# Patient Record
Sex: Female | Born: 1980 | Race: White | Hispanic: No | Marital: Married | Smoking: Current every day smoker
Health system: Southern US, Community
[De-identification: ages and names within clinical notes are randomized; demographics above are authoritative.]

## PROBLEM LIST (undated history)

## (undated) DIAGNOSIS — C50919 Malignant neoplasm of unspecified site of unspecified female breast: Secondary | ICD-10-CM

## (undated) DIAGNOSIS — I639 Cerebral infarction, unspecified: Secondary | ICD-10-CM

## (undated) DIAGNOSIS — I10 Essential (primary) hypertension: Secondary | ICD-10-CM

## (undated) DIAGNOSIS — M5430 Sciatica, unspecified side: Secondary | ICD-10-CM

## (undated) DIAGNOSIS — K5792 Diverticulitis of intestine, part unspecified, without perforation or abscess without bleeding: Secondary | ICD-10-CM

## (undated) DIAGNOSIS — C55 Malignant neoplasm of uterus, part unspecified: Secondary | ICD-10-CM

## (undated) DIAGNOSIS — G43909 Migraine, unspecified, not intractable, without status migrainosus: Secondary | ICD-10-CM

## (undated) DIAGNOSIS — A770 Spotted fever due to Rickettsia rickettsii: Secondary | ICD-10-CM

## (undated) HISTORY — PX: BREAST LUMPECTOMY: SHX2

## (undated) HISTORY — PX: ABDOMINAL HYSTERECTOMY: SHX81

## (undated) HISTORY — PX: CHOLECYSTECTOMY: SHX55

## (undated) HISTORY — PX: CARPAL TUNNEL RELEASE: SHX101

---

## 2011-06-06 DIAGNOSIS — I639 Cerebral infarction, unspecified: Secondary | ICD-10-CM

## 2011-06-06 HISTORY — DX: Cerebral infarction, unspecified: I63.9

## 2014-09-05 ENCOUNTER — Emergency Department (HOSPITAL_COMMUNITY)
Admission: EM | Admit: 2014-09-05 | Discharge: 2014-09-05 | Disposition: A | Discharge status: Home / Self Care | Payer: Medicaid Other | Attending: Emergency Medicine | Admitting: Emergency Medicine

## 2014-09-05 ENCOUNTER — Encounter (HOSPITAL_COMMUNITY): Payer: Self-pay | Admitting: *Deleted

## 2014-09-05 DIAGNOSIS — M545 Low back pain: Secondary | ICD-10-CM | POA: Diagnosis not present

## 2014-09-05 DIAGNOSIS — G8929 Other chronic pain: Secondary | ICD-10-CM | POA: Insufficient documentation

## 2014-09-05 DIAGNOSIS — Z853 Personal history of malignant neoplasm of breast: Secondary | ICD-10-CM | POA: Diagnosis not present

## 2014-09-05 DIAGNOSIS — I10 Essential (primary) hypertension: Secondary | ICD-10-CM | POA: Insufficient documentation

## 2014-09-05 DIAGNOSIS — Z760 Encounter for issue of repeat prescription: Secondary | ICD-10-CM | POA: Diagnosis present

## 2014-09-05 DIAGNOSIS — Z720 Tobacco use: Secondary | ICD-10-CM | POA: Diagnosis not present

## 2014-09-05 HISTORY — DX: Malignant neoplasm of unspecified site of unspecified female breast: C50.919

## 2014-09-05 HISTORY — DX: Sciatica, unspecified side: M54.30

## 2014-09-05 HISTORY — DX: Essential (primary) hypertension: I10

## 2014-09-05 MED ORDER — CYCLOBENZAPRINE HCL 10 MG PO TABS: 10.0000 mg | ORAL_TABLET | Freq: Three times a day (TID) | ORAL | Status: DC | PRN

## 2014-09-05 MED ORDER — CARVEDILOL 6.25 MG PO TABS: 6.2500 mg | ORAL_TABLET | Freq: Two times a day (BID) | ORAL | Status: DC

## 2014-09-05 MED ORDER — LISINOPRIL-HYDROCHLOROTHIAZIDE 20-12.5 MG PO TABS: 1.0000 | ORAL_TABLET | Freq: Every day | ORAL | Status: DC

## 2014-09-05 MED ORDER — LORATADINE 10 MG PO TABS: 10.0000 mg | ORAL_TABLET | Freq: Every day | ORAL | Status: DC

## 2014-09-05 MED ORDER — TRAMADOL HCL 50 MG PO TABS: 50.0000 mg | ORAL_TABLET | Freq: Four times a day (QID) | ORAL | Status: DC | PRN
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
## 2014-09-05 NOTE — Discharge Instructions (Signed)
Read the information below.  Use the prescribed medication as directed.  Please discuss all new medications with your pharmacist.  You may return to the Emergency Department at any time for worsening condition or any new symptoms that concern you.   Please check your blood pressure regularly.   Follow closely with your primary care provider.   If you develop fevers, loss of control of bowel or bladder, weakness or numbness in your legs, or are unable to walk, return to the ER for a recheck.

## 2014-09-19 ENCOUNTER — Emergency Department (HOSPITAL_COMMUNITY): Payer: Medicaid Other

## 2014-09-19 ENCOUNTER — Emergency Department (HOSPITAL_COMMUNITY)
Admission: EM | Admit: 2014-09-19 | Discharge: 2014-09-19 | Disposition: A | Discharge status: Home / Self Care | Payer: Medicaid Other | Attending: Emergency Medicine | Admitting: Emergency Medicine

## 2014-09-19 ENCOUNTER — Encounter (HOSPITAL_COMMUNITY): Payer: Self-pay

## 2014-09-19 DIAGNOSIS — I10 Essential (primary) hypertension: Secondary | ICD-10-CM | POA: Insufficient documentation

## 2014-09-19 DIAGNOSIS — G43909 Migraine, unspecified, not intractable, without status migrainosus: Secondary | ICD-10-CM | POA: Insufficient documentation

## 2014-09-19 DIAGNOSIS — Z79899 Other long term (current) drug therapy: Secondary | ICD-10-CM | POA: Insufficient documentation

## 2014-09-19 DIAGNOSIS — J0100 Acute maxillary sinusitis, unspecified: Secondary | ICD-10-CM | POA: Diagnosis not present

## 2014-09-19 DIAGNOSIS — Z853 Personal history of malignant neoplasm of breast: Secondary | ICD-10-CM | POA: Diagnosis not present

## 2014-09-19 DIAGNOSIS — R05 Cough: Secondary | ICD-10-CM | POA: Diagnosis present

## 2014-09-19 DIAGNOSIS — Z720 Tobacco use: Secondary | ICD-10-CM | POA: Diagnosis not present

## 2014-09-19 HISTORY — DX: Migraine, unspecified, not intractable, without status migrainosus: G43.909

## 2014-09-19 MED ORDER — BUTALBITAL-APAP-CAFFEINE 50-325-40 MG PO TABS: 2.0000 | ORAL_TABLET | ORAL | Status: DC | PRN

## 2014-09-19 MED ORDER — BENZONATATE 100 MG PO CAPS: 100.0000 mg | ORAL_CAPSULE | Freq: Three times a day (TID) | ORAL | Status: DC

## 2014-09-19 MED ORDER — AMOXICILLIN-POT CLAVULANATE 875-125 MG PO TABS: 1.0000 | ORAL_TABLET | Freq: Two times a day (BID) | ORAL | Status: DC

## 2014-09-19 NOTE — ED Provider Notes (Signed)
CSN: [id number]     Arrival date & time 09/19/14  1406 History   First MD Initiated Contact with Patient 09/19/14 [phone]     Chief Complaint  Patient presents with  . Cough  . Headache     (Consider location/radiation/quality/duration/timing/severity/associated sxs/prior Treatment) Patient is a 34 y.o. female presenting with cough. The history is provided by the patient. No language interpreter was used.  Cough Cough characteristics:  Productive Sputum characteristics:  Nondescript Severity:  Moderate Onset quality:  Gradual Duration:  2 weeks Timing:  Constant Progression:  Worsening Chronicity:  New Smoker: no   Context: upper respiratory infection   Relieved by:  Nothing Worsened by:  Nothing tried Associated symptoms: fever and sinus congestion   Associated symptoms: no chest pain    Pt thinks she may have a sinus infection.    Past Medical History  Diagnosis Date  . Sciatica   . Hypertension   . Breast cancer     left lumpectomy 2006  . Migraines    Past Surgical History  Procedure Laterality Date  . Abdominal hysterectomy    . Breast lumpectomy Left   . Carpal tunnel release Right    History reviewed. No pertinent family history. History  Substance Use Topics  . Smoking status: Current Every Day Smoker -- 0.25 packs/day    Types: Cigarettes  . Smokeless tobacco: Not on file  . Alcohol Use: Yes     Comment: rare   OB History    No data available     Review of Systems  Constitutional: Positive for fever.  Respiratory: Positive for cough.   Cardiovascular: Negative for chest pain.  All other systems reviewed and are negative.     Allergies  Reglan; Toradol; and Zofran  Home Medications   Prior to Admission medications   Medication Sig Start Date End Date Taking? Authorizing Provider  acetaminophen (TYLENOL) 325 MG tablet Take 650 mg by mouth every 6 (six) hours as needed for headache.   Yes Historical Provider, MD   Aspirin-Salicylamide-Caffeine (BC HEADACHE POWDER PO) Take 1 each by mouth as needed (headache).   Yes Historical Provider, MD  carvedilol (COREG) 6.25 MG tablet Take 1 tablet (6.25 mg total) by mouth 2 (two) times daily with a meal. 09/05/14  Yes Clayton Bibles, PA-C  lisinopril-hydrochlorothiazide (PRINZIDE,ZESTORETIC) 20-12.5 MG per tablet Take 1 tablet by mouth daily. 09/05/14  Yes Clayton Bibles, PA-C  loratadine (CLARITIN) 10 MG tablet Take 1 tablet (10 mg total) by mouth daily. 09/05/14  Yes Clayton Bibles, PA-C  cyclobenzaprine (FLEXERIL) 10 MG tablet Take 1 tablet (10 mg total) by mouth 3 (three) times daily as needed for muscle spasms. Patient not taking: Reported on 09/19/2014 09/05/14   Clayton Bibles, PA-C  traMADol (ULTRAM) 50 MG tablet Take 1 tablet (50 mg total) by mouth every 6 (six) hours as needed for moderate pain or severe pain. Patient not taking: Reported on 09/19/2014 09/05/14   Clayton Bibles, PA-C   BP 134/79 mmHg  Pulse 76  Temp(Src) 97.7 F (36.5 C) (Oral)  Resp 16  Ht 5\' 4"  (1.626 m)  Wt 144 lb 8 oz (65.545 kg)  BMI 24.79 kg/m2  SpO2 100% Physical Exam  Constitutional: She appears well-developed and well-nourished.  HENT:  Head: Normocephalic.  Right Ear: External ear normal.  Mouth/Throat: Oropharynx is clear and moist.  Eyes: Conjunctivae are normal. Pupils are equal, round, and reactive to light.  Neck: Normal range of motion. Neck supple.  Cardiovascular: Normal rate and normal  heart sounds.   Pulmonary/Chest: Effort normal and breath sounds normal.  Abdominal: Soft.  Musculoskeletal: Normal range of motion.  Skin: Skin is warm.  Nursing note and vitals reviewed.   ED Course  Procedures (including critical care time) Labs Review Labs Reviewed - No data to display  Imaging Review Dg Chest 2 View  09/19/2014   CLINICAL DATA:  Productive cough. Chest congestion. Dizziness for the past 2 weeks. Smoker. Hypertension.  EXAM: CHEST  2 VIEW  COMPARISON:  None.  FINDINGS: Normal  sized heart. Clear lungs. Mild diffuse peribronchial thickening and accentuation of the interstitial markings. Mild scoliosis. Cholecystectomy clips.  IMPRESSION: No acute abnormality.  Mild chronic bronchitic changes.   Electronically Signed   By: Claudie Revering M.D.   On: 09/19/2014 15:31     EKG Interpretation None      MDM   Final diagnoses:  Acute maxillary sinusitis, recurrence not specified    augmentin Tessalon fiorcet  (Pt has taken for headche in the past and request)    Fransico Meadow, PA-C 09/19/14 2239  Fredia Sorrow, MD 09/20/14 2136

## 2014-09-19 NOTE — ED Notes (Signed)
Onset 2 weeks productive cough- yellow phlegm, nasal congestion.  Onset several days headache and facial pain.  Onset this morning nausea, dizziness when standing.  No respiratory difficulties, talking in complete sentences.  Pt took IBU 800 mg @ 1 hour ago with no relief.

## 2014-09-19 NOTE — Discharge Instructions (Signed)
Sinusitis Sinusitis is redness, soreness, and inflammation of the paranasal sinuses. Paranasal sinuses are air pockets within the bones of your face (beneath the eyes, the middle of the forehead, or above the eyes). In healthy paranasal sinuses, mucus is able to drain out, and air is able to circulate through them by way of your nose. However, when your paranasal sinuses are inflamed, mucus and air can become trapped. This can allow bacteria and other germs to grow and cause infection. Sinusitis can develop quickly and last only a short time (acute) or continue over a long period (chronic). Sinusitis that lasts for more than 12 weeks is considered chronic.  CAUSES  Causes of sinusitis include:  Allergies.  Structural abnormalities, such as displacement of the cartilage that separates your nostrils (deviated septum), which can decrease the air flow through your nose and sinuses and affect sinus drainage.  Functional abnormalities, such as when the small hairs (cilia) that line your sinuses and help remove mucus do not work properly or are not present. SIGNS AND SYMPTOMS  Symptoms of acute and chronic sinusitis are the same. The primary symptoms are pain and pressure around the affected sinuses. Other symptoms include:  Upper toothache.  Earache.  Headache.  Bad breath.  Decreased sense of smell and taste.  A cough, which worsens when you are lying flat.  Fatigue.  Fever.  Thick drainage from your nose, which often is green and may contain pus (purulent).  Swelling and warmth over the affected sinuses. DIAGNOSIS  Your health care provider will perform a physical exam. During the exam, your health care provider may:  Look in your nose for signs of abnormal growths in your nostrils (nasal polyps).  Tap over the affected sinus to check for signs of infection.  View the inside of your sinuses (endoscopy) using an imaging device that has a light attached (endoscope). If your health  care provider suspects that you have chronic sinusitis, one or more of the following tests may be recommended:  Allergy tests.  Nasal culture. A sample of mucus is taken from your nose, sent to a lab, and screened for bacteria.  Nasal cytology. A sample of mucus is taken from your nose and examined by your health care provider to determine if your sinusitis is related to an allergy. TREATMENT  Most cases of acute sinusitis are related to a viral infection and will resolve on their own within 10 days. Sometimes medicines are prescribed to help relieve symptoms (pain medicine, decongestants, nasal steroid sprays, or saline sprays).  However, for sinusitis related to a bacterial infection, your health care provider will prescribe antibiotic medicines. These are medicines that will help kill the bacteria causing the infection.  Rarely, sinusitis is caused by a fungal infection. In theses cases, your health care provider will prescribe antifungal medicine. For some cases of chronic sinusitis, surgery is needed. Generally, these are cases in which sinusitis recurs more than 3 times per year, despite other treatments. HOME CARE INSTRUCTIONS   Drink plenty of water. Water helps thin the mucus so your sinuses can drain more easily.  Use a humidifier.  Inhale steam 3 to 4 times a day (for example, sit in the bathroom with the shower running).  Apply a warm, moist washcloth to your face 3 to 4 times a day, or as directed by your health care provider.  Use saline nasal sprays to help moisten and clean your sinuses.  Take medicines only as directed by your health care provider.    If you were prescribed either an antibiotic or antifungal medicine, finish it all even if you start to feel better. SEEK IMMEDIATE MEDICAL CARE IF:  You have increasing pain or severe headaches.  You have nausea, vomiting, or drowsiness.  You have swelling around your face.  You have vision problems.  You have a stiff  neck.  You have difficulty breathing. MAKE SURE YOU:   Understand these instructions.  Will watch your condition.  Will get help right away if you are not doing well or get worse. Document Released: 05/22/2005 Document Revised: 10/06/2013 Document Reviewed: 06/06/2011 [facility] Patient Information 2015 [place], [place]. This information is not intended to replace advice given to you by your health care provider. Make sure you discuss any questions you have with your health care provider.  Emergency Department Resource Guide 1) Find a Doctor and Pay Out of Pocket Although you won't have to find out who is covered by your insurance plan, it is a good idea to ask around and get recommendations. You will then need to call the office and see if the doctor you have chosen will accept you as a new patient and what types of options they offer for patients who are self-pay. Some doctors offer discounts or will set up payment plans for their patients who do not have insurance, but you will need to ask so you aren't surprised when you get to your appointment.  2) Contact Your Local Health Department Not all health departments have doctors that can see patients for sick visits, but many do, so it is worth a call to see if yours does. If you don't know where your local health department is, you can check in your phone book. The CDC also has a tool to help you locate your state's health department, and many state websites also have listings of all of their local health departments.  3) Find a [facility] If your illness is not likely to be very severe or complicated, you may want to try a walk in clinic. These are popping up all over the country in pharmacies, drugstores, and shopping centers. They're usually staffed by nurse practitioners or physician assistants that have been trained to treat common illnesses and complaints. They're usually fairly quick and inexpensive. However, if you have serious medical  issues or chronic medical problems, these are probably not your best option.  No Primary Care Doctor: - Call Health Connect at  [phone] - they can help you locate a primary care doctor that  accepts your insurance, provides certain services, etc. - Physician Referral Service- [phone]  Chronic Pain Problems: Organization         Address  Phone   Notes  [facility]  [phone] Patients need to be referred by their primary care doctor.   Medication Assistance: Organization         Address  Phone   Notes  [facility] Medication [facility] [address] [place],  [address] [phone] --Must be a resident of [facility] -- Must have NO insurance coverage whatsoever (no Medicaid/ Medicare, etc.) -- The pt. MUST have a primary care doctor that directs their care regularly and follows them in the community   MedAssist  [phone]   [employer]  [phone]    Agencies that provide inexpensive medical care: Organization         Address  Phone   Notes  [place]  [phone])  [facility]    [phone]   [facility] [place], [place] [address] [phone]   [place] [address] [place] [phone]   Planned Parenthood    [phone]   [facility]    [phone]   [place] and [place] [address], [place] Phone:  [phone], Fax:  [phone] Hours of Operation:  9 am - 6 pm, M-F.  Also accepts Medicaid/Medicare and self-pay.  [facility] for [place], [address], [place] Phone: [phone], Fax: [phone]. Hours of Operation:  8:30 am - 5:30 pm, M-F.  Also accepts Medicaid and self-pay.  [facility] [place] [address], [place] Phone: [phone]   [place], [place], [place]  [phone], Ext. [phone] Mondays & Thursdays: 7-9 AM.  First 15 patients are seen on a first come, first serve basis.    [place] Providers:  Organization         Address  Phone   Notes  [facility] [address], [place] [phone] Also accepts self-pay patients.  [facility] [address], [place]  [phone]   [place], [address], [place] [phone]   [facility] [address], [place] [phone]   Lucianne Lei [address], [place]   [phone] Only accepts [place] Access [place] patients after they have their name applied to their card.   Self-Pay (no insurance) in [facility]:  Organization         Address  Phone   Notes  Sickle Cell Patients, [facility] Internal Medicine [place] [phone]   [facility] Urgent Care [place] [phone]   [facility] [place]  JAARS, [place], [place] [phone]   [facility]/Dr. Osei-Bonsu  [address] [place] or [address], [place] [phone] Phone number for both [place] and [place] locations is the same.  Urgent Medical and [facility] [address], [place] [phone]   [facility] [address], [place] or [address] [phone]   [facility] [address] [place] [phone], phone; [phone], fax Sees patients 1st and 3rd Saturday of every month.  Must not qualify for public or private insurance (i.e. Medicaid, Medicare, [place] Health Choice, Veterans' Benefits)  Household income should be no more than 200% of the poverty level The clinic cannot treat you if you are pregnant or think you are pregnant  Sexually transmitted  diseases are not treated at the clinic.    Dental Care: Organization         Address  Phone  Notes  [facility] Department of [facility] [place] [phone] Accepts children up to age 17 who are enrolled in [place] or [place]; pregnant women with a Medicaid card; and children who have applied for Medicaid or  Health Choice, but were declined, whose parents can pay a reduced fee at time of service.  [place]  [address], [facility] [phone] Accepts children up to age 19 who are enrolled in Medicaid or [place]; pregnant women with a Medicaid card; and children who have applied for Medicaid or [place] Health Choice, but were declined, whose parents can pay a reduced fee at time of service.  [place] Adult Dental Access PROGRAM  [place] [phone] Patients are seen by appointment only. Walk-ins are not accepted. [place] will see patients 67 years of age and older. Monday - Tuesday (8am-5pm) Most Wednesdays (8:30-5pm) $30 per visit, cash only  [facility] Adult Dental Access PROGRAM  [address], [facility] [phone] Patients are seen by appointment only. Walk-ins are not accepted. [place] will see patients 33 years of age and older. One Wednesday Evening (Monthly: Volunteer Based).  $30 per visit, cash only  [place]  [phone] for adults; Children under age 69, call Graduate Pediatric Dentistry at [phone]. Children aged 36-14, please call [phone] to request a pediatric application.  Dental services are provided in all areas of dental care including fillings, crowns and bridges, complete and partial dentures, implants, gum treatment, root canals, and extractions. Preventive care is also provided. Treatment is provided to both adults and children. Patients are selected via a  lottery and there is often a waiting list.   [facility] [address], [place]  [phone] [email or web address]   [facility] [address] [place], [place] [phone], Ext. [phone] Second and Fourth Thursday of each month, opens at 6:30 AM; Clinic ends at 9 AM.  Patients are seen on a first-come first-served basis, and a limited number are seen during each clinic.   [facility]  [address] [place] Danker [place], [place] [phone]   Eligibility Requirements You must have lived in [place], [place], or [place] counties for at least the last three months.   You cannot be eligible for state or federal sponsored [employer], including [employer], [place], or [employer].   You generally cannot be eligible for healthcare insurance through your employer.    How to apply: Eligibility screenings are held every Tuesday and Wednesday afternoon from 1:00 pm until 4:00 pm. You do not need an appointment for the interview!  [facility] [address] [place], [place]   [place]  [place] Department  [place]  [phone]    Behavioral Health Resources in the Community: Intensive Outpatient Programs Organization         Address  Phone  Notes  [place] [facility]. [address] [place], [place] [phone]   [facility] Outpatient [address], [place], [place]   ADS: Alcohol & Drug Svcs [address], [place], [place]   [place] [address]  [place], [place] or [phone]   Substance Abuse Resources Organization         Address  Phone  Notes  Alcohol and Drug Services  [phone]   [place]  [phone]   [facility] [place]   Chinita Pester  [phone]   Residential &  Outpatient Substance Abuse Program  [phone]   Psychological Services Organization         Address  Phone  Notes  [place]  [place]  Services  [place] [address], [place] or [phone]    Mobile Crisis Teams Organization         Address  Phone  Notes  [facility], Mobile Crisis Care Unit  [phone]   [facility]  [address] [place], [place]   [place] [address], [place] [phone]    Self-Help/Support Groups Organization         Address  Phone             Notes  [place]. of [place] - variety of support groups  [place] Call for more information  Narcotics Anonymous (NA), [facility] [address], [employer] [place]  2 meetings at this location   Special educational needs teacher         Address  Phone  Notes  [facility] Residential Treatment [place],    [place]  [phone]   [facility]  [address], [place] [address], [place], [place]   [place], [place] [phone] Admissions: 8am-3pm M-F  Incentives Substance [place] [address]    [place], [place] [phone]   [facility] [address] [place], [place], [place]   [facility] [address],  [place], [place]   [facility]ight Programs - Intensive Outpatient [address], [place], [place]   [facility].,  [place], [place] [phone] or [phone]   [facility]idential Treatment Services (RTS) [address] [place], [place] Accepts Medicaid  [facility] [place] [address],  [place] [phone] Substance Abuse/Addiction Treatment   [facility] Organization          Address  Phone  Notes  [facility]  [phone]   Domenic Schwab, PhD [address] Arlis [place], [place]   [phone] or [phone]   [place], [place] [phone]   [facility] [address], [place], [place] [phone] Insurance/Medicaid/sponsorship through [facility] and Families [address] [place]                                    [place], [place] [phone] [place] [address], [place] [phone]    Dr. Adele Schilder  [phone]   [facility] Dept. 1) [address], [place] 2) [place] 3)  [place] [address], [place] [phone]  [phone]   [place] [phone] or [phone] (After Hours)

## 2014-09-19 NOTE — ED Notes (Signed)
Pt c/o congestion x 2 weeks; unrelieved with OTC medications. Reports generalized headache since yesterday, described as a fullness. Hx of migraines "but I haven't had one in really long time." Pt also reports productive cough with thick yellow phlegm

## 2014-09-20 ENCOUNTER — Emergency Department (HOSPITAL_COMMUNITY)
Admission: EM | Admit: 2014-09-20 | Discharge: 2014-09-20 | Disposition: A | Discharge status: Home / Self Care | Payer: Medicaid Other | Attending: Emergency Medicine | Admitting: Emergency Medicine

## 2014-09-20 ENCOUNTER — Encounter (HOSPITAL_COMMUNITY): Payer: Self-pay | Admitting: Emergency Medicine

## 2014-09-20 ENCOUNTER — Emergency Department (HOSPITAL_COMMUNITY): Payer: Medicaid Other

## 2014-09-20 DIAGNOSIS — M542 Cervicalgia: Secondary | ICD-10-CM | POA: Diagnosis not present

## 2014-09-20 DIAGNOSIS — R0981 Nasal congestion: Secondary | ICD-10-CM | POA: Insufficient documentation

## 2014-09-20 DIAGNOSIS — R112 Nausea with vomiting, unspecified: Secondary | ICD-10-CM | POA: Insufficient documentation

## 2014-09-20 DIAGNOSIS — Z8542 Personal history of malignant neoplasm of other parts of uterus: Secondary | ICD-10-CM | POA: Diagnosis not present

## 2014-09-20 DIAGNOSIS — R05 Cough: Secondary | ICD-10-CM | POA: Diagnosis not present

## 2014-09-20 DIAGNOSIS — G43909 Migraine, unspecified, not intractable, without status migrainosus: Secondary | ICD-10-CM | POA: Diagnosis not present

## 2014-09-20 DIAGNOSIS — R55 Syncope and collapse: Secondary | ICD-10-CM | POA: Insufficient documentation

## 2014-09-20 DIAGNOSIS — I10 Essential (primary) hypertension: Secondary | ICD-10-CM | POA: Diagnosis not present

## 2014-09-20 DIAGNOSIS — H53149 Visual discomfort, unspecified: Secondary | ICD-10-CM | POA: Diagnosis not present

## 2014-09-20 DIAGNOSIS — Z853 Personal history of malignant neoplasm of breast: Secondary | ICD-10-CM | POA: Diagnosis not present

## 2014-09-20 DIAGNOSIS — Z8673 Personal history of transient ischemic attack (TIA), and cerebral infarction without residual deficits: Secondary | ICD-10-CM | POA: Insufficient documentation

## 2014-09-20 DIAGNOSIS — Z79899 Other long term (current) drug therapy: Secondary | ICD-10-CM | POA: Insufficient documentation

## 2014-09-20 DIAGNOSIS — R51 Headache: Secondary | ICD-10-CM

## 2014-09-20 DIAGNOSIS — R059 Cough, unspecified: Secondary | ICD-10-CM

## 2014-09-20 DIAGNOSIS — R519 Headache, unspecified: Secondary | ICD-10-CM

## 2014-09-20 HISTORY — DX: Malignant neoplasm of uterus, part unspecified: C55

## 2014-09-20 HISTORY — DX: Malignant neoplasm of unspecified site of unspecified female breast: C50.919

## 2014-09-20 HISTORY — DX: Cerebral infarction, unspecified: I63.9

## 2014-09-20 LAB — CBC
HCT: 39.6 % (ref 36.0–46.0)
HEMOGLOBIN: 13.1 g/dL (ref 12.0–15.0)
MCH: 32.8 pg (ref 26.0–34.0)
MCHC: 33.1 g/dL (ref 30.0–36.0)
MCV: 99 fL (ref 78.0–100.0)
Platelets: 381 10*3/uL (ref 150–400)
RBC: 4 MIL/uL (ref 3.87–5.11)
RDW: 13 % (ref 11.5–15.5)
WBC: 7.1 10*3/uL (ref 4.0–10.5)

## 2014-09-20 LAB — CBG MONITORING, ED: GLUCOSE-CAPILLARY: 99 mg/dL (ref 70–99)

## 2014-09-20 LAB — BASIC METABOLIC PANEL
Anion gap: 9 (ref 5–15)
BUN: 15 mg/dL (ref 6–23)
CO2: 23 mmol/L (ref 19–32)
Calcium: 9.2 mg/dL (ref 8.4–10.5)
Chloride: 106 mmol/L (ref 96–112)
Creatinine, Ser: 0.81 mg/dL (ref 0.50–1.10)
GFR calc non Af Amer: >90 mL/min (ref >90)
GLUCOSE: 97 mg/dL (ref 70–99)
Potassium: 3.6 mmol/L (ref 3.5–5.1)
Sodium: 138 mmol/L (ref 135–145)

## 2014-09-20 LAB — I-STAT TROPONIN, ED: TROPONIN I, POC: 0 ng/mL (ref 0.00–0.08)

## 2014-09-20 LAB — D-DIMER, QUANTITATIVE: D-Dimer, Quant: <0.27 ug/mL-FEU (ref 0.00–0.48)

## 2014-09-20 MED ORDER — ACETAMINOPHEN 325 MG PO TABS
650.0000 mg | ORAL_TABLET | Freq: Once | ORAL | Status: DC
  Filled 2014-09-20: qty 2

## 2014-09-20 MED ORDER — ALBUTEROL SULFATE HFA 108 (90 BASE) MCG/ACT IN AERS
2.0000 | INHALATION_SPRAY | RESPIRATORY_TRACT | Status: DC | PRN
  Administered 2014-09-20: 2 via RESPIRATORY_TRACT
  Filled 2014-09-20: qty 6.7

## 2014-09-20 MED ORDER — PROMETHAZINE HCL 25 MG PO TABS: 25.0000 mg | ORAL_TABLET | Freq: Four times a day (QID) | ORAL | Status: DC | PRN

## 2014-09-20 MED ORDER — DIPHENHYDRAMINE HCL 50 MG/ML IJ SOLN
25.0000 mg | Freq: Once | INTRAMUSCULAR | Status: AC
  Administered 2014-09-20: 25 mg via INTRAVENOUS
  Filled 2014-09-20 (×2): qty 1

## 2014-09-20 MED ORDER — LORAZEPAM 1 MG PO TABS
0.5000 mg | ORAL_TABLET | Freq: Once | ORAL | Status: DC
  Filled 2014-09-20: qty 1

## 2014-09-20 MED ORDER — PROMETHAZINE HCL 25 MG/ML IJ SOLN
12.5000 mg | Freq: Once | INTRAMUSCULAR | Status: AC
  Administered 2014-09-20: 12.5 mg via INTRAVENOUS
  Filled 2014-09-20: qty 1

## 2014-09-20 MED ORDER — PROCHLORPERAZINE EDISYLATE 5 MG/ML IJ SOLN
10.0000 mg | Freq: Four times a day (QID) | INTRAMUSCULAR | Status: DC | PRN
  Administered 2014-09-20: 10 mg via INTRAVENOUS
  Filled 2014-09-20 (×2): qty 2

## 2014-09-20 NOTE — ED Provider Notes (Signed)
CSN: [id number]     Arrival date & time 09/20/14  6073 History   First MD Initiated Contact with Patient 09/20/14 [id number]     Chief Complaint  Patient presents with  . Neck Pain  . Chest Pain    recently diagnosed with bronchitis  . Loss of Consciousness     (Consider location/radiation/quality/duration/timing/severity/associated sxs/prior Treatment) HPI Comments: Patient with reported history of stroke with left sided weakness which resolved after a month of therapy, hypertension, DVT s/p blood thinner and not currently anticoagulated -- presents with c/o syncope. Patient has been having cough and sinus pressure for the past 2 weeks. Patient was seen in emergency department yesterday for this, had a negative chest x-ray, was prescribed Augmentin Tessalon which she started taking yesterday. She was also given Fioricet for a persistent headache. Patient has a history of migraines. Headache is similar to past migraines with photophobia. Patient states that she awoke at approximately 4 AM today with complaint of pressure across her entire chest with associated left arm heaviness. She was lying on the couch and stood up and syncopized. She blacked out and struck the back of her head on a nightstand. Patient continues to complain of persistent unchanged headache, neck pain, chest pressure. Patient states that she has been nauseous and vomiting for the past 2 days. No new leg swelling or tenderness. EMS was called and patient was placed in a c-collar. She was also given 324mg  ASA and nitroglycerin without any affect on her chest pressure. The onset of this condition was acute. The course is constant. Aggravating factors: none. Alleviating factors: none.    The history is provided by the patient.    Past Medical History  Diagnosis Date  . Sciatica   . Hypertension   . Breast cancer     left lumpectomy 2006  . Migraines   . Breast CA   . Uterine cancer   . Stroke 2013   Past Surgical History   Procedure Laterality Date  . Abdominal hysterectomy    . Breast lumpectomy Left   . Carpal tunnel release Right    No family history on file. History  Substance Use Topics  . Smoking status: Current Every Day Smoker -- 0.25 packs/day    Types: Cigarettes  . Smokeless tobacco: Not on file  . Alcohol Use: Yes     Comment: rare   OB History    No data available     Review of Systems  Constitutional: Negative for fever, diaphoresis and fatigue.  HENT: Positive for congestion and sinus pressure. Negative for dental problem, rhinorrhea and tinnitus.   Eyes: Positive for photophobia. Negative for pain, discharge, redness and visual disturbance.  Respiratory: Positive for cough. Negative for shortness of breath.   Cardiovascular: Positive for chest pain. Negative for palpitations and leg swelling.  Gastrointestinal: Positive for nausea and vomiting. Negative for abdominal pain.  Genitourinary: Negative for dysuria.  Musculoskeletal: Positive for neck pain and neck stiffness. Negative for back pain and gait problem.  Skin: Negative for rash and wound.  Neurological: Positive for syncope and headaches. Negative for dizziness, speech difficulty, weakness, light-headedness and numbness.  Psychiatric/Behavioral: Negative for confusion and decreased concentration. The patient is not nervous/anxious.     Allergies  Reglan; Toradol; and Zofran  Home Medications   Prior to Admission medications   Medication Sig Start Date End Date Taking? Authorizing Provider  acetaminophen (TYLENOL) 325 MG tablet Take 650 mg by mouth every 6 (six) hours as needed for headache.  Historical Provider, MD  amoxicillin-clavulanate (AUGMENTIN) 875-125 MG per tablet Take 1 tablet by mouth 2 (two) times daily. 09/19/14   Fransico Meadow, PA-C  Aspirin-Salicylamide-Caffeine [facility] HEADACHE POWDER PO) Take 1 each by mouth as needed (headache).    Historical Provider, MD  benzonatate (TESSALON) 100 MG capsule Take 1  capsule (100 mg total) by mouth every 8 (eight) hours. 09/19/14   Fransico Meadow, PA-C  butalbital-acetaminophen-caffeine [facility]) [phone] MG per tablet Take 2 tablets by mouth every 4 (four) hours as needed for headache. 09/19/14   Fransico Meadow, PA-C  carvedilol (COREG) 6.25 MG tablet Take 1 tablet (6.25 mg total) by mouth 2 (two) times daily with a meal. 09/05/14   Clayton Bibles, PA-C  cyclobenzaprine (FLEXERIL) 10 MG tablet Take 1 tablet (10 mg total) by mouth 3 (three) times daily as needed for muscle spasms. Patient not taking: Reported on 09/19/2014 09/05/14   Clayton Bibles, PA-C  lisinopril-hydrochlorothiazide (PRINZIDE,ZESTORETIC) 20-12.5 MG per tablet Take 1 tablet by mouth daily. 09/05/14   Clayton Bibles, PA-C  loratadine (CLARITIN) 10 MG tablet Take 1 tablet (10 mg total) by mouth daily. 09/05/14   Clayton Bibles, PA-C  traMADol (ULTRAM) 50 MG tablet Take 1 tablet (50 mg total) by mouth every 6 (six) hours as needed for moderate pain or severe pain. Patient not taking: Reported on 09/19/2014 09/05/14   Clayton Bibles, PA-C   BP 127/84 mmHg  Pulse 64  Resp 14  SpO2 100%   Physical Exam  Constitutional: She is oriented to person, place, and time. She appears well-developed and well-nourished.  Pt tearful.   HENT:  Head: Normocephalic and atraumatic. Head is without raccoon's eyes and without Battle's sign.  Right Ear: Tympanic membrane, external ear and ear canal normal. No hemotympanum.  Left Ear: Tympanic membrane, external ear and ear canal normal. No hemotympanum.  Nose: Nose normal. No nasal septal hematoma.  Mouth/Throat: Uvula is midline, oropharynx is clear and moist and mucous membranes are normal. Mucous membranes are not dry.  Eyes: Conjunctivae, EOM and lids are normal. Pupils are equal, round, and reactive to light. Right eye exhibits no nystagmus. Left eye exhibits no nystagmus.  No visible hyphema noted  Neck: Trachea normal. Neck supple. Normal carotid pulses and no JVD present. No muscular  tenderness present. Carotid bruit is not present. No tracheal deviation present.  Immobilized in c-collar  Cardiovascular: Normal rate, regular rhythm, S1 normal, S2 normal, normal heart sounds and intact distal pulses.  Exam reveals no decreased pulses.   No murmur heard. Pulmonary/Chest: Effort normal and breath sounds normal. No respiratory distress. She has no wheezes. She exhibits no tenderness.  Occasional cough during exam.   Abdominal: Soft. Normal aorta and bowel sounds are normal. There is no tenderness. There is no rebound and no guarding.  Musculoskeletal: Normal range of motion.       Cervical back: She exhibits normal range of motion, no tenderness and no bony tenderness.       Thoracic back: She exhibits no tenderness and no bony tenderness.       Lumbar back: She exhibits no tenderness and no bony tenderness.  Neurological: She is alert and oriented to person, place, and time. She has normal strength and normal reflexes. No cranial nerve deficit or sensory deficit. Coordination normal. GCS eye subscore is 4. GCS verbal subscore is 5. GCS motor subscore is 6.  Skin: Skin is warm and dry. She is not diaphoretic. No cyanosis. No pallor.  Psychiatric: She  has a normal mood and affect.  Nursing note and vitals reviewed.   ED Course  Procedures (including critical care time) Labs Review Labs Reviewed  CBC  BASIC METABOLIC PANEL  D-DIMER, QUANTITATIVE  I-STAT [place], ED  CBG MONITORING, ED    Imaging Review Dg Chest 2 View  09/19/2014   CLINICAL DATA:  Productive cough. Chest congestion. Dizziness for the past 2 weeks. Smoker. Hypertension.  EXAM: CHEST  2 VIEW  COMPARISON:  None.  FINDINGS: Normal sized heart. Clear lungs. Mild diffuse peribronchial thickening and accentuation of the interstitial markings. Mild scoliosis. Cholecystectomy clips.  IMPRESSION: No acute abnormality.  Mild chronic bronchitic changes.   Electronically Signed   By: Claudie Revering M.D.   On:  09/19/2014 15:31   Ct Head Wo Contrast  09/20/2014   CLINICAL DATA:  EMS called out for Chest pressure. Started around 0400. Seen at [facility] ED and diagnosed with Bronchitis yesterday and started on augmentin. States she was putting shoes on this morning and when she stood up she passed out. Woke up with head on night stand. Given asa 324 mg via ems and NTG SL X 1 with no relief in chest pain. C/O headache, neck pain, numbness to left side of face. Also c/o nausea. No vomiting at this time. H/o stroke, HTN.  EXAM: CT HEAD WITHOUT CONTRAST  CT CERVICAL SPINE WITHOUT CONTRAST  TECHNIQUE: Multidetector CT imaging of the head and cervical spine was performed following the standard protocol without intravenous contrast. Multiplanar CT image reconstructions of the cervical spine were also generated.  COMPARISON:  None.  FINDINGS: CT HEAD FINDINGS  Ventricles are normal in size and configuration. There are no parenchymal masses or mass effect. There is no evidence of an infarct. There are no extra-axial masses or abnormal fluid collections.  There is no intracranial hemorrhage.  No skull fracture. Mild mucosal thickening lines posterior ethmoid air cells in the right sphenoid sinus. Mastoid air cells are clear.  CT CERVICAL SPINE FINDINGS  No fracture. No spondylolisthesis. There are no degenerative changes. Soft tissues are unremarkable. Lung apices are clear.  IMPRESSION: HEAD CT: No intracranial abnormality. Mild sinus mucosal thickening.  CERVICAL CT:  Normal.   Electronically Signed   By: Lajean Manes M.D.   On: 09/20/2014 07:47   Ct Cervical Spine Wo Contrast  09/20/2014   CLINICAL DATA:  EMS called out for Chest pressure. Started around 0400. Seen at [facility] ED and diagnosed with Bronchitis yesterday and started on augmentin. States she was putting shoes on this morning and when she stood up she passed out. Woke up with head on night stand. Given asa 324 mg via ems and NTG SL X 1 with no relief in chest pain. C/O  headache, neck pain, numbness to left side of face. Also c/o nausea. No vomiting at this time. H/o stroke, HTN.  EXAM: CT HEAD WITHOUT CONTRAST  CT CERVICAL SPINE WITHOUT CONTRAST  TECHNIQUE: Multidetector CT imaging of the head and cervical spine was performed following the standard protocol without intravenous contrast. Multiplanar CT image reconstructions of the cervical spine were also generated.  COMPARISON:  None.  FINDINGS: CT HEAD FINDINGS  Ventricles are normal in size and configuration. There are no parenchymal masses or mass effect. There is no evidence of an infarct. There are no extra-axial masses or abnormal fluid collections.  There is no intracranial hemorrhage.  No skull fracture. Mild mucosal thickening lines posterior ethmoid air cells in the right sphenoid sinus. Mastoid air cells  are clear.  CT CERVICAL SPINE FINDINGS  No fracture. No spondylolisthesis. There are no degenerative changes. Soft tissues are unremarkable. Lung apices are clear.  IMPRESSION: HEAD CT: No intracranial abnormality. Mild sinus mucosal thickening.  CERVICAL CT:  Normal.   Electronically Signed   By: Lajean Manes M.D.   On: 09/20/2014 07:47     EKG Interpretation   Date/Time:  Sunday September 20 2014 06:53:24 EDT Ventricular Rate:  63 PR Interval:  127 QRS Duration: 80 QT Interval:  428 QTC Calculation: 438 R Axis:   70 Text Interpretation:  Sinus rhythm No old tracing to compare Confirmed by  [facility]  MD, ELLIOTT [phone]) on 09/20/2014 6:59:37 AM      7:11 AM Patient seen and examined. Work-up initiated. Medications ordered. Discussed with Dr. Mingo Amber. EKG reviewed.   Vital signs reviewed and are as follows: BP 127/84 mmHg  Pulse 64  Resp 14  SpO2 100%  8:18 AM Work-up is completely normal. Imaging normal (except for sinusitis). D-dimer negative. Patient is tearful and crying. She is allergic to toradol, reglan, zofran. She states she cannot tolerate compazine. She does not want ibuprofen, tylenol.    C-collar removed. She is able to move neck completely in all 6 directions. No signs of meningismus. Will give some PO ativan. Patient up in hallway ambulating.   8:37 AM Asked Dr. Mingo Amber to see patient. Patient has agreed to take compazine.   11:16 AM Patient received phenergan earlier as well for nausea. She was rechecked. She is sleepy but states HA is somewhat better and nausea is gone. She requests to go home and sleep. Neurologically, her exam is stable. No fever, meningismus, or other neuro deficits.   Will d/c to home.   Patient counseled to return if they have weakness in their arms or legs, slurred speech, trouble walking or talking, confusion, trouble with their balance, or if they have any other concerns. Patient verbalizes understanding and agrees with plan.    MDM   Final diagnoses:  HA (headache)  Syncope, unspecified syncope type  Acute nonintractable headache, unspecified headache type  Cough   Patient with cough, HA, syncopal episode today. She is not pregnant (previous hysterectomy). Imaging ordered 2/2 trauma - are neg. D-dimer due to previous blood clot history - negative. Other labs neg. Patient without high-risk features of headache including: sudden onset/thunderclap HA, no similar headache in past, altered mental status, accompanying seizure, headache with exertion, age > 3, history of immunocompromise, shoulder pain, neck stiffness, fever, use of anticoagulation, family history of spontaneous SAH, concomitant drug use, toxic exposure.   Patient has a normal complete neurological exam, normal vital signs, normal level of consciousness, no signs of meningismus, is well-appearing/non-toxic appearing, no signs of trauma.  No dangerous or life-threatening conditions suspected or identified by history, physical exam, and by work-up. No indications for hospitalization identified.     Carlisle Cater, PA-C 09/20/14 1119  Evelina Bucy, MD 09/20/14 [phone]

## 2014-09-20 NOTE — ED Notes (Signed)
Pt removed all of her monitoring equipment and ambulated with ease to the restroom.  Pt crying and yelling at staff stating she can't take anything by mouth because she's throwing up.  This RN has not seen any evidence of emesis since arrival; pt has been spitting on floor. Pt has refused both PO and IV medications. Pt began to rip her IV out and then stopped herself stated she wanted to talk to someone else.  Pt states we "are treating her like crap because she's poor."  Educated pt that we are unaware of her insurance status and that it has nothing to do with her care.  Notified [place], PA and [employer], Agricultural consultant.

## 2014-09-20 NOTE — ED Notes (Signed)
EMS called out for Chest pressure.  Started around 0400.  Diagnosed with Bronchitis yesterday and started on augmentin.  States I was putting my shoes on this morning and when I stood up I passed out.  Woke up with head on night stand.  Given asa 324 mg via ems and NTG SL X 1 with no relief in chest pain.  C/O headache, neck pain, numbness to left side of face.  Also c/o nausea.  No vomiting at this time.

## 2014-09-20 NOTE — ED Notes (Signed)
ekg given to Dr Wentz 

## 2014-09-20 NOTE — ED Notes (Addendum)
Pt ambulated to the bathroom, pt tolerated well. Pt refusing to be connected to the monitor, pt states "there's nothing wrong with me, why do I need to be hooked up to a heart monitor." RN at bedside, PA aware.

## 2014-09-20 NOTE — Discharge Instructions (Signed)
Please read and follow all provided instructions.  Your diagnoses today include:  1. Syncope, unspecified syncope type   2. HA (headache)   3. Acute nonintractable headache, unspecified headache type   4. Cough     Tests performed today include:  CT of your head and neck which was normal and did not show any serious cause of your headache  Blood counts and electrolytes - normal  Blood test for blood clot - negative  Vital signs. See below for your results today.   Medications:  In the Emergency Department you received:  Compazine - antinausea/headache medication  Benadryl - antihistamine to counteract potential side effects of reglan   Albuterol inhaler - medication that opens up your airway  Use inhaler as follows: 1-2 puffs with spacer every 4 hours as needed for wheezing, cough, or shortness of breath.    Phenergan (promethazine) - for nausea and vomiting   Take any prescribed medications only as directed.  Additional information:  Follow any educational materials contained in this packet.  You are having a headache. No specific cause was found today for your headache. It may have been a migraine or other cause of headache. Stress, anxiety, fatigue, and depression are common triggers for headaches.   Your headache today does not appear to be life-threatening or require hospitalization, but often the exact cause of headaches is not determined in the emergency department. Therefore, follow-up with your doctor is very important to find out what may have caused your headache and whether or not you need any further diagnostic testing or treatment.   Sometimes headaches can appear benign (not harmful), but then more serious symptoms can develop which should prompt an immediate re-evaluation by your doctor or the emergency department.  BE VERY CAREFUL not to take multiple medicines containing Tylenol (also called acetaminophen). Doing so can lead to an overdose which can  damage your liver and cause liver failure and possibly death.   Follow-up instructions: Please follow-up with your primary care provider in the next 3 days for further evaluation of your symptoms.   Return instructions:   Please return to the Emergency Department if you experience worsening symptoms.  Return if the medications do not resolve your headache, if it recurs, or if you have multiple episodes of vomiting or cannot keep down fluids.  Return if you have a change from the usual headache.  RETURN IMMEDIATELY IF you:  Develop a sudden, severe headache  Develop confusion or become poorly responsive or faint  Develop a fever above 100.61F or problem breathing  Have a change in speech, vision, swallowing, or understanding  Develop new weakness, numbness, tingling, incoordination in your arms or legs  Have a seizure  Please return if you have any other emergent concerns.  Additional Information:  Your vital signs today were: BP 109/67 mmHg   Pulse 66   Resp 18   SpO2 99% If your blood pressure (BP) was elevated above 135/85 this visit, please have this repeated by your doctor within one month. --------------

## 2014-09-20 NOTE — ED Notes (Signed)
Josh, PA-C, at the bedside.

## 2014-09-20 NOTE — ED Notes (Signed)
cbg of 99.

## 2014-09-20 NOTE — ED Notes (Signed)
Pt yelling and spitting on floor in exam room. Reports 10/10 headache at the time. Primary RN informed.

## 2014-09-25 ENCOUNTER — Emergency Department (HOSPITAL_COMMUNITY)
Admission: EM | Admit: 2014-09-25 | Discharge: 2014-09-25 | Disposition: A | Discharge status: Home / Self Care | Payer: Medicaid Other | Attending: Emergency Medicine | Admitting: Emergency Medicine

## 2014-09-25 ENCOUNTER — Encounter (HOSPITAL_COMMUNITY): Payer: Self-pay | Admitting: Emergency Medicine

## 2014-09-25 DIAGNOSIS — Z79899 Other long term (current) drug therapy: Secondary | ICD-10-CM | POA: Insufficient documentation

## 2014-09-25 DIAGNOSIS — I10 Essential (primary) hypertension: Secondary | ICD-10-CM | POA: Insufficient documentation

## 2014-09-25 DIAGNOSIS — Z792 Long term (current) use of antibiotics: Secondary | ICD-10-CM | POA: Diagnosis not present

## 2014-09-25 DIAGNOSIS — M545 Low back pain: Secondary | ICD-10-CM | POA: Diagnosis not present

## 2014-09-25 DIAGNOSIS — G8929 Other chronic pain: Secondary | ICD-10-CM

## 2014-09-25 DIAGNOSIS — Z8673 Personal history of transient ischemic attack (TIA), and cerebral infarction without residual deficits: Secondary | ICD-10-CM | POA: Diagnosis not present

## 2014-09-25 DIAGNOSIS — M549 Dorsalgia, unspecified: Secondary | ICD-10-CM

## 2014-09-25 DIAGNOSIS — Z853 Personal history of malignant neoplasm of breast: Secondary | ICD-10-CM | POA: Diagnosis not present

## 2014-09-25 DIAGNOSIS — Z8541 Personal history of malignant neoplasm of cervix uteri: Secondary | ICD-10-CM | POA: Diagnosis not present

## 2014-09-25 DIAGNOSIS — Z720 Tobacco use: Secondary | ICD-10-CM | POA: Insufficient documentation

## 2014-09-25 MED ORDER — TRAMADOL HCL 50 MG PO TABS: 50.0000 mg | ORAL_TABLET | Freq: Four times a day (QID) | ORAL | Status: DC | PRN

## 2014-09-25 MED ORDER — CYCLOBENZAPRINE HCL 10 MG PO TABS: 10.0000 mg | ORAL_TABLET | Freq: Two times a day (BID) | ORAL | Status: DC | PRN

## 2014-09-25 MED ORDER — PREDNISONE 10 MG (21) PO TBPK: 10.0000 mg | ORAL_TABLET | Freq: Every day | ORAL | Status: DC

## 2014-09-25 NOTE — Progress Notes (Signed)
CM spoke with pt who confirms self pay [facility] resident with no pcp. CM discussed and provided written information for self pay pcps, importance of pcp for f/u care, www.needymeds.org, www.goodrx.com, discounted pharmacies and other [employer] such as Felicia Moore, [facility], affordable care act,  [place] assistance, DSS and  health department  Reviewed resources for [employer] self pay pcps like [facility], family medicine at [address], [facility] family practice, general medical clinics, [facility] urgent care plus others, medication resources, [facility] out patient pharmacies and housing Pt voiced understanding and appreciation of resources provided   Provided [facility] contact information Pt agreed to referral Cm completed referral

## 2014-09-25 NOTE — ED Notes (Addendum)
Pt c/o back and bilateral leg pain. Patient knows she has history of sciatica. Has been re-locating residences and moving boxes so now has exacerbated pain. Says she is able to go a year and a half without pain and sometimes the pain will come three times in a month. Does not currently have insurance. Has appointment with [facility] on May 3rd but says the pain is too bad. Ambulatory with steady gait. Has intermittent numbness/tinlging in left leg radiating from left buttock and back. No other c/c. Took Aleve this morning around 1130. No alleviation of symptoms. Pt says MD in [place] gave her a steroid shot which "really helped" along with Flexeril and Ultram which alleviated pain.
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
## 2014-09-25 NOTE — Discharge Instructions (Signed)
Read the information below.  Use the prescribed medication as directed.  Please discuss all new medications with your pharmacist.  You may return to the Emergency Department at any time for worsening condition or any new symptoms that concern you.    If there is any possibility that you might be pregnant, please let your health care provider know and discuss this with the pharmacist to ensure medication safety.     If you develop fevers, loss of control of bowel or bladder, weakness or numbness in your legs, or are unable to walk, return to the ER for a recheck.    Chronic Back Pain  When back pain lasts longer than 3 months, it is called chronic back pain.People with chronic back pain often go through certain periods that are more intense (flare-ups).  CAUSES Chronic back pain can be caused by wear and tear (degeneration) on different structures in your back. These structures include:  The bones of your spine (vertebrae) and the joints surrounding your spinal cord and nerve roots (facets).  The strong, fibrous tissues that connect your vertebrae (ligaments). Degeneration of these structures may result in pressure on your nerves. This can lead to constant pain. HOME CARE INSTRUCTIONS  Avoid bending, heavy lifting, prolonged sitting, and activities which make the problem worse.  Take brief periods of rest throughout the day to reduce your pain. Lying down or standing usually is better than sitting while you are resting.  Take over-the-counter or prescription medicines only as directed by your caregiver. SEEK IMMEDIATE MEDICAL CARE IF:   You have weakness or numbness in one of your legs or feet.  You have trouble controlling your bladder or bowels.  You have nausea, vomiting, abdominal pain, shortness of breath, or fainting. Document Released: 06/29/2004 Document Revised: 08/14/2011 Document Reviewed: 05/06/2011 [facility] Patient Information 2015 [place], [place]. This information is not  intended to replace advice given to you by your health care provider. Make sure you discuss any questions you have with your health care provider.

## 2014-09-25 NOTE — ED Notes (Signed)
CM at bedside; will discharge post consult.

## 2014-09-25 NOTE — Progress Notes (Signed)
[facility] ED CM contacted by Triage RN/NP/PA to attempt to get pt an appointment at [facility]. States pt has been attempting to get an appt without success  [facility] ED Cm contacted a scheduler for [facility] and Cm unable to get pt an appointment.  Staff suggested pt go to [facility] during walk in clinic times Monday-Thursday 02-1029 am and from 2-3:30 pm for possible earlier appt This information placed in pt ED d/c f/u section  Pt also given further [place] self pay resources like [facility], [facility], [facility] and [facility] plus medication resources

## 2014-09-28 ENCOUNTER — Ambulatory Visit: Payer: Medicaid Other | Attending: Internal Medicine | Admitting: Family Medicine

## 2014-09-28 ENCOUNTER — Encounter: Payer: Self-pay | Admitting: Family Medicine

## 2014-09-28 VITALS — BP 116/72 | HR 79 | Temp 97.5°F | Resp 18 | Ht 64.0 in | Wt 149.6 lb

## 2014-09-28 DIAGNOSIS — Z720 Tobacco use: Secondary | ICD-10-CM | POA: Diagnosis not present

## 2014-09-28 DIAGNOSIS — I10 Essential (primary) hypertension: Secondary | ICD-10-CM | POA: Diagnosis not present

## 2014-09-28 DIAGNOSIS — M545 Low back pain, unspecified: Secondary | ICD-10-CM | POA: Insufficient documentation

## 2014-09-28 DIAGNOSIS — M5440 Lumbago with sciatica, unspecified side: Secondary | ICD-10-CM | POA: Diagnosis present

## 2014-09-28 DIAGNOSIS — M5442 Lumbago with sciatica, left side: Secondary | ICD-10-CM

## 2014-09-28 DIAGNOSIS — J309 Allergic rhinitis, unspecified: Secondary | ICD-10-CM | POA: Insufficient documentation

## 2014-09-28 MED ORDER — CYCLOBENZAPRINE HCL 10 MG PO TABS: 10.0000 mg | ORAL_TABLET | Freq: Two times a day (BID) | ORAL | Status: DC | PRN

## 2014-09-28 MED ORDER — TRAMADOL HCL 50 MG PO TABS: 50.0000 mg | ORAL_TABLET | Freq: Four times a day (QID) | ORAL | Status: DC | PRN
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
## 2014-09-28 NOTE — Patient Instructions (Signed)
Take medications as prescribed No living over 25 pounds for next 2 weeks. May use heat or ice, whichever helps most. Follow-up for visit to establish care with primary doctor in 3 weeks.

## 2014-10-09 ENCOUNTER — Encounter (HOSPITAL_COMMUNITY): Payer: Self-pay

## 2014-10-09 ENCOUNTER — Emergency Department (HOSPITAL_COMMUNITY)
Admission: EM | Admit: 2014-10-09 | Discharge: 2014-10-10 | Disposition: A | Discharge status: Home / Self Care | Payer: Medicaid Other | Attending: Emergency Medicine | Admitting: Emergency Medicine

## 2014-10-09 DIAGNOSIS — Z720 Tobacco use: Secondary | ICD-10-CM | POA: Diagnosis not present

## 2014-10-09 DIAGNOSIS — Z853 Personal history of malignant neoplasm of breast: Secondary | ICD-10-CM | POA: Diagnosis not present

## 2014-10-09 DIAGNOSIS — R140 Abdominal distension (gaseous): Secondary | ICD-10-CM

## 2014-10-09 DIAGNOSIS — Z8542 Personal history of malignant neoplasm of other parts of uterus: Secondary | ICD-10-CM | POA: Diagnosis not present

## 2014-10-09 DIAGNOSIS — I10 Essential (primary) hypertension: Secondary | ICD-10-CM | POA: Diagnosis not present

## 2014-10-09 DIAGNOSIS — G43909 Migraine, unspecified, not intractable, without status migrainosus: Secondary | ICD-10-CM | POA: Insufficient documentation

## 2014-10-09 DIAGNOSIS — Z8673 Personal history of transient ischemic attack (TIA), and cerebral infarction without residual deficits: Secondary | ICD-10-CM | POA: Insufficient documentation

## 2014-10-09 DIAGNOSIS — R1030 Lower abdominal pain, unspecified: Secondary | ICD-10-CM

## 2014-10-09 DIAGNOSIS — Z7952 Long term (current) use of systemic steroids: Secondary | ICD-10-CM | POA: Insufficient documentation

## 2014-10-09 DIAGNOSIS — K5732 Diverticulitis of large intestine without perforation or abscess without bleeding: Secondary | ICD-10-CM | POA: Diagnosis not present

## 2014-10-09 DIAGNOSIS — Z79899 Other long term (current) drug therapy: Secondary | ICD-10-CM | POA: Insufficient documentation

## 2014-10-09 DIAGNOSIS — Z90710 Acquired absence of both cervix and uterus: Secondary | ICD-10-CM | POA: Diagnosis not present

## 2014-10-09 DIAGNOSIS — R109 Unspecified abdominal pain: Secondary | ICD-10-CM | POA: Diagnosis present

## 2014-10-09 DIAGNOSIS — Z792 Long term (current) use of antibiotics: Secondary | ICD-10-CM | POA: Insufficient documentation

## 2014-10-09 LAB — CBC WITH DIFFERENTIAL/PLATELET
Basophils Absolute: 0 10*3/uL (ref 0.0–0.1)
Basophils Relative: 0 % (ref 0–1)
Eosinophils Absolute: 0.2 10*3/uL (ref 0.0–0.7)
Eosinophils Relative: 3 % (ref 0–5)
HCT: 38.3 % (ref 36.0–46.0)
Hemoglobin: 12.6 g/dL (ref 12.0–15.0)
Lymphocytes Relative: 44 % (ref 12–46)
Lymphs Abs: 3.1 10*3/uL (ref 0.7–4.0)
MCH: 33.4 pg (ref 26.0–34.0)
MCHC: 32.9 g/dL (ref 30.0–36.0)
MCV: 101.6 fL — ABNORMAL HIGH (ref 78.0–100.0)
Monocytes Absolute: 0.4 10*3/uL (ref 0.1–1.0)
Monocytes Relative: 6 % (ref 3–12)
Neutro Abs: 3.2 10*3/uL (ref 1.7–7.7)
Neutrophils Relative %: 47 % (ref 43–77)
Platelets: 311 10*3/uL (ref 150–400)
RBC: 3.77 MIL/uL — ABNORMAL LOW (ref 3.87–5.11)
RDW: 12.9 % (ref 11.5–15.5)
WBC: 6.9 10*3/uL (ref 4.0–10.5)

## 2014-10-09 LAB — COMPREHENSIVE METABOLIC PANEL
ALT: 51 U/L (ref 14–54)
AST: 40 U/L (ref 15–41)
Albumin: 4.1 g/dL (ref 3.5–5.0)
Alkaline Phosphatase: 86 U/L (ref 38–126)
Anion gap: 7 (ref 5–15)
BUN: 21 mg/dL — ABNORMAL HIGH (ref 6–20)
CO2: 22 mmol/L (ref 22–32)
Calcium: 9 mg/dL (ref 8.9–10.3)
Chloride: 110 mmol/L (ref 101–111)
Creatinine, Ser: 0.67 mg/dL (ref 0.44–1.00)
GFR calc Af Amer: >60 mL/min (ref >60)
GFR calc non Af Amer: >60 mL/min (ref >60)
Glucose, Bld: 119 mg/dL — ABNORMAL HIGH (ref 70–99)
Potassium: 3.8 mmol/L (ref 3.5–5.1)
Sodium: 139 mmol/L (ref 135–145)
Total Bilirubin: 0.5 mg/dL (ref 0.3–1.2)
Total Protein: 7.3 g/dL (ref 6.5–8.1)

## 2014-10-09 LAB — LIPASE, BLOOD: Lipase: 23 U/L (ref 22–51)

## 2014-10-09 NOTE — ED Notes (Signed)
Pt presents via EMS from home with c/o abdominal pain that started today. Pt reports distention to EMS and vomiting bile today. Pt also reported that she has been having some pus in her bowel movements today.

## 2014-10-09 NOTE — ED Notes (Signed)
Pt asked to go back to lobby after she told the NT who drew her blood she was "not sitting back in the lobby with the other people". This RN explained that the consult room is where we draw blood for waiting patient's. Pt reports that she is having "dark blood" in her stool when she uses the restroom and abdomen appears distended. Pt made aware of the wait and will be moving patients as soon as clean rooms are available.

## 2014-10-10 ENCOUNTER — Emergency Department (HOSPITAL_COMMUNITY): Payer: Medicaid Other

## 2014-10-10 LAB — URINALYSIS, ROUTINE W REFLEX MICROSCOPIC
Bilirubin Urine: NEGATIVE
Glucose, UA: NEGATIVE mg/dL
KETONES UR: NEGATIVE mg/dL
LEUKOCYTES UA: NEGATIVE
NITRITE: NEGATIVE
PH: 5.5 (ref 5.0–8.0)
PROTEIN: NEGATIVE mg/dL
SPECIFIC GRAVITY, URINE: 1.042 — AB (ref 1.005–1.030)
UROBILINOGEN UA: 0.2 mg/dL (ref 0.0–1.0)

## 2014-10-10 LAB — URINE MICROSCOPIC-ADD ON

## 2014-10-10 MED ORDER — HYDROCODONE-ACETAMINOPHEN 5-325 MG PO TABS
1.0000 | ORAL_TABLET | Freq: Once | ORAL | Status: AC
  Administered 2014-10-10: 1 via ORAL
  Filled 2014-10-10: qty 1

## 2014-10-10 MED ORDER — IOHEXOL 300 MG/ML  SOLN
50.0000 mL | Freq: Once | INTRAMUSCULAR | Status: AC | PRN
  Administered 2014-10-10: 50 mL via ORAL

## 2014-10-10 MED ORDER — PROMETHAZINE HCL 25 MG/ML IJ SOLN
12.5000 mg | Freq: Once | INTRAMUSCULAR | Status: AC
  Administered 2014-10-10: 12.5 mg via INTRAVENOUS
  Filled 2014-10-10: qty 1

## 2014-10-10 MED ORDER — PROMETHAZINE HCL 25 MG PO TABS: 25.0000 mg | ORAL_TABLET | Freq: Four times a day (QID) | ORAL | Status: DC | PRN

## 2014-10-10 MED ORDER — IOHEXOL 300 MG/ML  SOLN
100.0000 mL | Freq: Once | INTRAMUSCULAR | Status: AC | PRN
  Administered 2014-10-10: 100 mL via INTRAVENOUS

## 2014-10-10 MED ORDER — CIPROFLOXACIN HCL 500 MG PO TABS
500.0000 mg | ORAL_TABLET | Freq: Once | ORAL | Status: AC
  Administered 2014-10-10: 500 mg via ORAL
  Filled 2014-10-10: qty 1

## 2014-10-10 MED ORDER — SODIUM CHLORIDE 0.9 % IV BOLUS (SEPSIS)
1000.0000 mL | Freq: Once | INTRAVENOUS | Status: AC
  Administered 2014-10-10: 1000 mL via INTRAVENOUS

## 2014-10-10 MED ORDER — CIPROFLOXACIN HCL 500 MG PO TABS: 500.0000 mg | ORAL_TABLET | Freq: Two times a day (BID) | ORAL | Status: DC

## 2014-10-10 MED ORDER — HYDROMORPHONE HCL 1 MG/ML IJ SOLN
0.5000 mg | Freq: Once | INTRAMUSCULAR | Status: AC
  Administered 2014-10-10: 0.5 mg via INTRAVENOUS
  Filled 2014-10-10: qty 1

## 2014-10-10 MED ORDER — METRONIDAZOLE 500 MG PO TABS
500.0000 mg | ORAL_TABLET | Freq: Once | ORAL | Status: AC
  Administered 2014-10-10: 500 mg via ORAL
  Filled 2014-10-10: qty 1

## 2014-10-10 MED ORDER — METRONIDAZOLE 500 MG PO TABS: 500.0000 mg | ORAL_TABLET | Freq: Two times a day (BID) | ORAL | Status: DC

## 2014-10-10 MED ORDER — HYDROCODONE-ACETAMINOPHEN 5-325 MG PO TABS: 1.0000 | ORAL_TABLET | Freq: Four times a day (QID) | ORAL | Status: DC | PRN

## 2014-10-10 NOTE — ED Provider Notes (Signed)
Review of CT scan shows that patient has right upper quadrant diverticulitis.  She has been started on Cipro, Flagyl, Phenergan for nausea and hydrocodone for pain control.  This was discussed with patient.  He was comfortable going home.  She states she does not have a primary care physician.  She has been given a Fut[employer] trader as she just recently received her Medicaid card  Junius Creamer, NP 10/10/14 2641  Merryl Hacker, MD 10/10/14 [phone]

## 2014-10-10 NOTE — ED Provider Notes (Signed)
CSN: [id number]     Arrival date & time 10/09/14  2111 History   First MD Initiated Contact with Patient 10/09/14 2333     Chief Complaint  Patient presents with  . Abdominal Pain     (Consider location/radiation/quality/duration/timing/severity/associated sxs/prior Treatment) Patient is a 34 y.o. female presenting with abdominal pain. The history is provided by the patient.  Abdominal Pain Associated symptoms: no chest pain, no chills, no diarrhea, no dysuria, no fever, no shortness of breath and no vomiting   Felicia Moore is a 34 y.o female with a history of breast cancer and HTN who presents with new onset and worsening right sided abdominal cramping that began yesterday.  She states her abdomen began to swell and she has had small bowel movement with mucus.  She also has nausea but no vomiting.  She denies any fever, shortness of breath, vomiting, hematochezia, constipation, dysuria, hematuria, or urinary frequency. She did have a cholecystectomy and partial hysterectomy.     Past Medical History  Diagnosis Date  . Sciatica   . Hypertension   . Breast cancer     left lumpectomy 2006  . Migraines   . Breast CA   . Uterine cancer   . Stroke 2013   Past Surgical History  Procedure Laterality Date  . Abdominal hysterectomy    . Breast lumpectomy Left   . Carpal tunnel release Right    No family history on file. History  Substance Use Topics  . Smoking status: Current Every Day Smoker -- 0.25 packs/day    Types: Cigarettes  . Smokeless tobacco: Not on file  . Alcohol Use: Yes     Comment: rare   OB History    No data available     Review of Systems  Constitutional: Negative for fever and chills.  Respiratory: Negative for shortness of breath.   Cardiovascular: Negative for chest pain.  Gastrointestinal: Positive for abdominal pain and abdominal distention. Negative for vomiting, diarrhea and blood in stool.  Genitourinary: Negative for dysuria and frequency.  All  other systems reviewed and are negative.     Allergies  Reglan; Toradol; and Zofran  Home Medications   Prior to Admission medications   Medication Sig Start Date End Date Taking? Authorizing Provider  acetaminophen (TYLENOL) 325 MG tablet Take 650 mg by mouth every 6 (six) hours as needed for headache.    Historical Provider, MD  amoxicillin-clavulanate (AUGMENTIN) 875-125 MG per tablet Take 1 tablet by mouth 2 (two) times daily. Patient not taking: Reported on 09/28/2014 09/19/14   Fransico Meadow, PA-C  Aspirin-Salicylamide-Caffeine [facility] HEADACHE POWDER PO) Take 1 each by mouth as needed (headache).    Historical Provider, MD  benzonatate (TESSALON) 100 MG capsule Take 1 capsule (100 mg total) by mouth every 8 (eight) hours. Patient not taking: Reported on 09/28/2014 09/19/14   Fransico Meadow, PA-C  butalbital-acetaminophen-caffeine [facility]) [phone] MG per tablet Take 2 tablets by mouth every 4 (four) hours as needed for headache. 09/19/14   Fransico Meadow, PA-C  carvedilol (COREG) 6.25 MG tablet Take 1 tablet (6.25 mg total) by mouth 2 (two) times daily with a meal. 09/05/14   Clayton Bibles, PA-C  cyclobenzaprine (FLEXERIL) 10 MG tablet Take 1 tablet (10 mg total) by mouth 2 (two) times daily as needed for muscle spasms. 09/28/14   Micheline Chapman, NP  lisinopril-hydrochlorothiazide (PRINZIDE,ZESTORETIC) 20-12.5 MG per tablet Take 1 tablet by mouth daily. 09/05/14   Clayton Bibles, PA-C  loratadine (CLARITIN) 10  MG tablet Take 1 tablet (10 mg total) by mouth daily. 09/05/14   Clayton Bibles, PA-C  predniSONE (STERAPRED UNI-PAK 21 TAB) 10 MG (21) TBPK tablet Take 1 tablet (10 mg total) by mouth daily. Day 1: take 6 tabs.  Day 2: 5 tabs  Day 3: 4 tabs  Day 4: 3 tabs  Day 5: 2 tabs  Day 6: 1 tab 09/25/14   Clayton Bibles, PA-C  promethazine (PHENERGAN) 25 MG tablet Take 1 tablet (25 mg total) by mouth every 6 (six) hours as needed for nausea or vomiting. Patient not taking: Reported on 09/28/2014 09/20/14   Carlisle Cater, PA-C  traMADol (ULTRAM) 50 MG tablet Take 1 tablet (50 mg total) by mouth every 6 (six) hours as needed for severe pain. 09/28/14   Micheline Chapman, NP   BP 141/91 mmHg  Pulse 82  Temp(Src) 99 F (37.2 C) (Oral)  Resp 19  SpO2 98% Physical Exam  Constitutional: She is oriented to person, place, and time. She appears well-developed and well-nourished.  HENT:  Head: Normocephalic and atraumatic.  Eyes: Conjunctivae are normal.  Neck: Normal range of motion. Neck supple.  Cardiovascular: Normal rate, regular rhythm and normal heart sounds.   Pulmonary/Chest: Effort normal.  Abdominal: She exhibits distension. There is tenderness in the right lower quadrant and left lower quadrant.  Musculoskeletal: Normal range of motion.  Neurological: She is alert and oriented to person, place, and time.  Skin: Skin is warm and dry.  Nursing note and vitals reviewed.   ED Course  Procedures (including critical care time) Labs Review Labs Reviewed  CBC WITH DIFFERENTIAL/PLATELET - Abnormal; Notable for the following:    RBC 3.77 (*)    MCV 101.6 (*)    All other components within normal limits  COMPREHENSIVE METABOLIC PANEL - Abnormal; Notable for the following:    Glucose, Bld 119 (*)    BUN 21 (*)    All other components within normal limits  LIPASE, BLOOD  URINALYSIS, ROUTINE W REFLEX MICROSCOPIC    Imaging Review Dg Abd 1 View  10/10/2014   CLINICAL DATA:  Abdominal pain, distention, and swelling. Nausea, vomiting, and constipation for 1 day.  EXAM: ABDOMEN - 1 VIEW  COMPARISON:  None.  FINDINGS: Right upper quadrant abdominal surgical clips are noted. Gas is present in the nondilated small and large bowel without evidence of obstruction. There is a small to moderate amount of stool throughout the colon. No acute osseous abnormality is seen.  IMPRESSION: Nonobstructed bowel-gas pattern.   Electronically Signed   By: Logan Bores   On: 10/10/2014 00:18     EKG  Interpretation None      MDM   Final diagnoses:  Abdominal distension  Lower abdominal pain  Patient presents for abdominal cramping and distention that began yesterday.  She has had several small mucusy bowel movements. On exam she has moderate abdominal distention with RLQ and LLQ abdominal pain with palpation.  Her vitals are stable and her labs are not concerning. Her CXR shows nonobstructing bowel gas pattern.  She has a history of cancer so I will get a CT abdomen.   I discussed this patient with and transferred care to Eula Flax, PA who will follow the patient.  The plan depends on the results of the CT.        Ottie Glazier, PA-C 10/10/14 0103  Milton Ferguson, MD 10/10/14 [phone]

## 2014-10-10 NOTE — Discharge Instructions (Signed)
°Emergency Department Resource Guide °1) Find a Doctor and Pay Out of Pocket °Although you won't have to find out who is covered by your insurance plan, it is a good idea to ask around and get recommendations. You will then need to call the office and see if the doctor you have chosen will accept you as a new patient and what types of options they offer for patients who are self-pay. Some doctors offer discounts or will set up payment plans for their patients who do not have insurance, but you will need to ask so you aren't surprised when you get to your appointment. ° °2) Contact Your Local Health Department °Not all health departments have doctors that can see patients for sick visits, but many do, so it is worth a call to see if yours does. If you don't know where your local health department is, you can check in your phone book. The CDC also has a tool to help you locate your state's health department, and many state websites also have listings of all of their local health departments. ° °3) Find a Walk-in Clinic °If your illness is not likely to be very severe or complicated, you may want to try a walk in clinic. These are popping up all over the country in pharmacies, drugstores, and shopping centers. They're usually staffed by nurse practitioners or physician assistants that have been trained to treat common illnesses and complaints. They're usually fairly quick and inexpensive. However, if you have serious medical issues or chronic medical problems, these are probably not your best option. ° °No Primary Care Doctor: °- Call [facility] at  832-8000 - they can help you locate a primary care doctor that  accepts your insurance, provides certain services, etc. °- Physician Referral Service- 1-800-533-3463 ° °Chronic Pain Problems: °Organization         Address  Phone   Notes  °[facility]  (336) 297-2271 Patients need to be referred by their primary care doctor.  ° °Medication  Assistance: °Organization         Address  Phone   Notes  °[place] [facility] [address] °[place], [place] [address] (336) 641-8030 --Must be a resident of [place] °-- Must have NO insurance coverage whatsoever (no Medicaid/ Medicare, etc.) °-- The pt. MUST have a primary care doctor that directs their care regularly and follows them in the community °  °[facility]  (866) 331-1348   °[employer]  (888) 892-1162   ° °Agencies that provide inexpensive medical care: °Organization         Address  Phone   Notes  °[facility]  (336) 832-8035   °[facility]    (336) 832-7272   °[facility] [address] °[place], [place] [address] (336) 832-4777   °[facility] [address], °[place] (336) 271-4999   °[facility]    (336) 373-0678   °[facility]    (336) 272-1050   °[facility] ° [address], [place] Phone:  (336) 832-4444, Fax:  (336) 832-4440 Hours of Operation:  9 am - 6 pm, M-F.  Also accepts Medicaid/Medicare and self-pay.  °[facility] ° [address], [place] Phone: (336) 832-3150, Fax: (336) [phone]. Hours of Operation:  8:30 am - 5:30 pm, M-F.  Also accepts Medicaid and self-pay.  °[facility] [place] [address], [place] Phone: (336) 878-6027   °[facility] [address], [place], [place] (336)723-1848, Ext. 123 Mondays & Thursdays: 7-9 AM.  First 15 patients are seen on a first come, first serve basis. °  ° °Medicaid-accepting [place] Providers: ° °Organization         Address  Phone   Notes  °[facility] [address], [place] (336) 641-2100 Also accepts self-pay patients.  °[facility] [address], [place] ° (336) 856-9996   °[facility] [address], [place]  (336) 288-8857   °[facility] [address] [place] [address], [place] (336) 299-7000   °Veita Bland [address], [place]  ° (336) 373-1557 Only accepts [place] Access Medicaid patients after they have their name applied to their card.  ° °Self-Pay (no insurance) in [place]: ° °Organization         Address  Phone   Notes  °Sickle Cell Patients, [facility] [address], [place] (336) 832-1970   °[facility] [address], [place] (336) 832-4400   °[facility] ° [address], [place] (336) 992-4800   °[facility]/Dr. Osei-Bonsu ° [address] [place] [address], [place] or [address], [place] (336) [phone] Phone number for both [place] and [place] locations is the same.  °[facility] [address], [place] (336) 299-0000   °[facility] [address] [place] [address], [place] or [address] (336) 852-7530 °(336) 878-2260   °[facility] [address], [place] (336) 350-1642, phone; (336) 294-5005, fax Sees patients 1st and 3rd Saturday of every month.  Must not qualify for public or private insurance (i.e. Medicaid, Medicare, [place] Health Choice, Veterans' Benefits) • Household income should be no more than 200% of the poverty level •The clinic cannot treat you if you are pregnant or think you are pregnant • Sexually transmitted diseases are not treated at the clinic.  ° ° °Dental Care: °Organization         Address  Phone  Notes  °[place] [facility] [address], [place] (336) 641-6152 Accepts children up to age 21 who are enrolled in Medicaid or [place] Health Choice; pregnant women with a Medicaid card; and children who have applied for Medicaid or [place] Health Choice, but were declined, whose parents can pay a reduced fee at time of service.  °[place]  [facility] [place]  [address], [place] (336) 641-7733 Accepts children up to age 21 who are enrolled in Medicaid or [place] Health Choice; pregnant women with a Medicaid card; and children who have applied for Medicaid or  Health Choice, but were declined, whose parents can pay a reduced fee at time of service.  °[facility] ° [address], [place] (336) 641-4533 Patients are seen by appointment only. Walk-ins are not accepted. [facility] will see patients 18 years of age and older. °Monday - Tuesday (8am-5pm) °Most Wednesdays (8:30-5pm) °$30 per visit, cash only  °[facility] ° [address], [place] (336) 641-4533 Patients are seen by appointment only. Walk-ins are not accepted. [facility] will see patients 18 years of age and older. °One   Wednesday Evening (Monthly: Volunteer Based).  $30 per visit, cash only  °[facility]  (919) 537-3737 for adults; Children under age 4, call Graduate Pediatric Dentistry at ([phone]) [phone]. Children aged 4-14, please call (919) [phone] to request a pediatric application. ° Dental services are provided in all areas of dental care including fillings, crowns and bridges, complete and partial dentures, implants, gum treatment, root canals, and extractions. Preventive care is also provided. Treatment is provided to both adults and children. °Patients are selected via a lottery and there is often a waiting list. °  °[facility] [address], °[place] ° (336) 763-8833 [email or web address] °  °[facility] [address], [place], [place] (336)723-1848, Ext. 123 Second and Fourth Thursday of each month, opens at 6:30 AM; Clinic ends at 9 AM.  Patients are seen on a first-come first-served basis, and a limited number are seen during each clinic.  ° °[facility] ° [address], [place], [place] (336) [phone]    Eligibility Requirements °You must have lived in [place], [place], or [place] counties for at least the last three months. °  You cannot be eligible for state or federal sponsored healthcare insurance, including Veterans Administration, Medicaid, or Medicare. °  You generally cannot be eligible for healthcare insurance through your employer.  °  How to apply: °Eligibility screenings are held every Tuesday and Wednesday afternoon from 1:00 pm until 4:00 pm. You do not need an appointment for the interview!  °[facility] [address], [place], [place] 336-631-2330   °[facility]  336-342-8273   °[facility]  336-703-3100   °[facility]  336-570-6415   ° °Behavioral Health Resources in the Community: °Intensive Outpatient Programs °Organization         Address  Phone  Notes  °[facility] [address], [place], [place] 336-878-6098   °[facility] [address], [place], [place] 336-832-9800   °[facility] [address], [place], [place] ° 336-882-2125   °[facility] [address],  °[place], [place] 1-800-853-5163 or 336-641-4981   °Substance Abuse Resources °Organization         Address  Phone  Notes  °[facility]  336-882-2125   °[facility]  336-784-9470   °[facility]  336-285-9073   °[facility]  336-845-3988   °Residential & Outpatient Substance Abuse Program  1-800-659-3381   °Psychological Services °Organization         Address  Phone  Notes  °[facility]  336- 832-9600   °[facility]  336- 378-7881   °[facility] [address], [place] 1-800-853-5163 or 336-641-4981   ° °Mobile Crisis Teams °Organization         Address  Phone  Notes  °[facility], Mobile Crisis Care Unit  1-877-626-1772   °Assertive °[facility] ° [address]  [place], [place] 336-834-9664   °Sharon DeEsch [address] °[place] 336-554-5454   ° °Self-Help/Support Groups °Organization         Address  Phone             Notes  °[facility] - variety of support groups  336- 373-1402 Call for more information  °Narcotics Anonymous (NA), [facility] [address], °[place]  2 meetings at this location  ° °  Residential Treatment Programs °Organization         Address  Phone  Notes  °[facility] [address],    °[place]  1-866-801-8205   °[facility] ° [address], [place], [place] 704-293-8524   °[facility] [address], [place] 336-845-3988 Admissions: 8am-3pm M-F  °[facility] [address]    °[place], [place] 336-841-1104   °[facility] [address], [place], [place] 336-379-7146   °[facility] [address]  °[place], [place] 336-285-9073   °[facility] Intensive Outpatient [address], [place], [place] 336-852-3033   °[facility] [address]  °[place], [place] 1-877-615-2722 or 336-784-9470   °[facility] [address] [place], [place] 336-227-7417 Accepts Medicaid  °[facility] [address]  °[place] 1-800-659-3381 Substance Abuse/Addiction Treatment  ° °[place] Behavioral Health Resources °Organization         Address  Phone  Notes  °[facility]  (888) 581-9988   °Julie Brannon, PhD [address] [place], [place]   (336) 349-5553 or (336) 951-0000   °[facility]   [address] °[place], [place] (336) 349-4454   °[facility] [address], [place], [place] (336) 342-8316 Insurance/Medicaid/sponsorship through [facility]  °[facility] [address]                                    [place], [place] (336) 342-8316 Therapy/tele-psych/case    °[facility] [address]  ° [place], [place] (336) 349-2233    °Dr. Arfeen  (336) 349-4544   °[facility] [place]  [employer] [place] [facility] 1) [address],  °2) [address], [place] °3)  [address], [place] (336) 349-3220 °(336) 342-7768 ° °(336) 342-8140   °[place] [facility] (336) 342-1394 or (336) 342-3537 (After Hours)    ° ° °

## 2014-10-15 ENCOUNTER — Emergency Department (HOSPITAL_COMMUNITY)
Admission: EM | Admit: 2014-10-15 | Discharge: 2014-10-15 | Disposition: A | Discharge status: Home / Self Care | Payer: Medicaid Other | Attending: Emergency Medicine | Admitting: Emergency Medicine

## 2014-10-15 ENCOUNTER — Encounter (HOSPITAL_COMMUNITY): Payer: Self-pay | Admitting: Emergency Medicine

## 2014-10-15 DIAGNOSIS — Z79899 Other long term (current) drug therapy: Secondary | ICD-10-CM | POA: Insufficient documentation

## 2014-10-15 DIAGNOSIS — K5792 Diverticulitis of intestine, part unspecified, without perforation or abscess without bleeding: Secondary | ICD-10-CM

## 2014-10-15 DIAGNOSIS — Z8542 Personal history of malignant neoplasm of other parts of uterus: Secondary | ICD-10-CM | POA: Insufficient documentation

## 2014-10-15 DIAGNOSIS — K5732 Diverticulitis of large intestine without perforation or abscess without bleeding: Secondary | ICD-10-CM | POA: Insufficient documentation

## 2014-10-15 DIAGNOSIS — Z8673 Personal history of transient ischemic attack (TIA), and cerebral infarction without residual deficits: Secondary | ICD-10-CM | POA: Diagnosis not present

## 2014-10-15 DIAGNOSIS — R1110 Vomiting, unspecified: Secondary | ICD-10-CM | POA: Diagnosis present

## 2014-10-15 DIAGNOSIS — Z792 Long term (current) use of antibiotics: Secondary | ICD-10-CM | POA: Diagnosis not present

## 2014-10-15 DIAGNOSIS — R05 Cough: Secondary | ICD-10-CM | POA: Diagnosis not present

## 2014-10-15 DIAGNOSIS — Z791 Long term (current) use of non-steroidal anti-inflammatories (NSAID): Secondary | ICD-10-CM | POA: Insufficient documentation

## 2014-10-15 DIAGNOSIS — I10 Essential (primary) hypertension: Secondary | ICD-10-CM | POA: Diagnosis not present

## 2014-10-15 DIAGNOSIS — G43909 Migraine, unspecified, not intractable, without status migrainosus: Secondary | ICD-10-CM | POA: Insufficient documentation

## 2014-10-15 DIAGNOSIS — Z90710 Acquired absence of both cervix and uterus: Secondary | ICD-10-CM | POA: Insufficient documentation

## 2014-10-15 DIAGNOSIS — Z720 Tobacco use: Secondary | ICD-10-CM | POA: Diagnosis not present

## 2014-10-15 LAB — COMPREHENSIVE METABOLIC PANEL
ALK PHOS: 64 U/L (ref 38–126)
ALT: 20 U/L (ref 14–54)
AST: 17 U/L (ref 15–41)
Albumin: 3.9 g/dL (ref 3.5–5.0)
Anion gap: 8 (ref 5–15)
BILIRUBIN TOTAL: 0.4 mg/dL (ref 0.3–1.2)
BUN: 24 mg/dL — ABNORMAL HIGH (ref 6–20)
CHLORIDE: 110 mmol/L (ref 101–111)
CO2: 23 mmol/L (ref 22–32)
CREATININE: 0.71 mg/dL (ref 0.44–1.00)
Calcium: 8.8 mg/dL — ABNORMAL LOW (ref 8.9–10.3)
GFR calc Af Amer: >60 mL/min (ref >60)
Glucose, Bld: 93 mg/dL (ref 65–99)
POTASSIUM: 3.8 mmol/L (ref 3.5–5.1)
SODIUM: 141 mmol/L (ref 135–145)
Total Protein: 6.4 g/dL — ABNORMAL LOW (ref 6.5–8.1)

## 2014-10-15 LAB — URINALYSIS, ROUTINE W REFLEX MICROSCOPIC
Bilirubin Urine: NEGATIVE
Glucose, UA: NEGATIVE mg/dL
KETONES UR: NEGATIVE mg/dL
NITRITE: NEGATIVE
PROTEIN: NEGATIVE mg/dL
SPECIFIC GRAVITY, URINE: 1.004 — AB (ref 1.005–1.030)
UROBILINOGEN UA: 0.2 mg/dL (ref 0.0–1.0)
pH: 6 (ref 5.0–8.0)

## 2014-10-15 LAB — CBC WITH DIFFERENTIAL/PLATELET
BASOS ABS: 0 10*3/uL (ref 0.0–0.1)
Basophils Relative: 1 % (ref 0–1)
EOS ABS: 0.1 10*3/uL (ref 0.0–0.7)
EOS PCT: 2 % (ref 0–5)
HEMATOCRIT: 34.9 % — AB (ref 36.0–46.0)
HEMOGLOBIN: 11.6 g/dL — AB (ref 12.0–15.0)
LYMPHS ABS: 2.3 10*3/uL (ref 0.7–4.0)
Lymphocytes Relative: 38 % (ref 12–46)
MCH: 33.4 pg (ref 26.0–34.0)
MCHC: 33.2 g/dL (ref 30.0–36.0)
MCV: 100.6 fL — ABNORMAL HIGH (ref 78.0–100.0)
MONOS PCT: 6 % (ref 3–12)
Monocytes Absolute: 0.4 10*3/uL (ref 0.1–1.0)
Neutro Abs: 3.3 10*3/uL (ref 1.7–7.7)
Neutrophils Relative %: 53 % (ref 43–77)
Platelets: 422 10*3/uL — ABNORMAL HIGH (ref 150–400)
RBC: 3.47 MIL/uL — ABNORMAL LOW (ref 3.87–5.11)
RDW: 12.7 % (ref 11.5–15.5)
WBC: 6.1 10*3/uL (ref 4.0–10.5)

## 2014-10-15 LAB — URINE MICROSCOPIC-ADD ON

## 2014-10-15 LAB — LIPASE, BLOOD: Lipase: 24 U/L (ref 22–51)

## 2014-10-15 MED ORDER — PROMETHAZINE HCL 25 MG PO TABS
25.0000 mg | ORAL_TABLET | Freq: Once | ORAL | Status: AC
  Administered 2014-10-15: 25 mg via ORAL
  Filled 2014-10-15: qty 1

## 2014-10-15 MED ORDER — PROMETHAZINE HCL 25 MG PO TABS
25.0000 mg | ORAL_TABLET | Freq: Four times a day (QID) | ORAL | Status: DC | PRN
Start: 2014-10-15 — End: 2014-11-16

## 2014-10-15 MED ORDER — OXYCODONE-ACETAMINOPHEN 5-325 MG PO TABS
2.0000 | ORAL_TABLET | Freq: Once | ORAL | Status: AC
  Administered 2014-10-15: 2 via ORAL
  Filled 2014-10-15: qty 2

## 2014-10-15 MED ORDER — OXYCODONE-ACETAMINOPHEN 5-325 MG PO TABS: 1.0000 | ORAL_TABLET | ORAL | Status: DC | PRN

## 2014-10-15 MED ORDER — AMOXICILLIN-POT CLAVULANATE 875-125 MG PO TABS
1.0000 | ORAL_TABLET | Freq: Once | ORAL | Status: AC
  Administered 2014-10-15: 1 via ORAL
  Filled 2014-10-15: qty 1

## 2014-10-15 MED ORDER — AMOXICILLIN-POT CLAVULANATE 875-125 MG PO TABS: 1.0000 | ORAL_TABLET | Freq: Two times a day (BID) | ORAL | Status: DC

## 2014-10-15 NOTE — Progress Notes (Addendum)
Pt with medicaid since CM saw her last Reports she and female at bedside are "suppose to have appointments at [facility] on the six[facility] but I would like to get a medicaid doctor closer to my home." Pt said " I have a question. We have been told we can get a bus pass." CM explained to pt that ED SW may be able to provide a bust pass but it would only for the pt.   Cm provided pt and female with lists of [facility] providers to assist with getting medicaid dr close to her home  Pt given a bus pass and her ED RN notified pt given bus pass already  Cm encouraged pt to get a pcp and f/u with [facility] appt

## 2014-10-15 NOTE — ED Provider Notes (Signed)
CSN: [id number]     Arrival date & time 10/15/14  1320 History   First MD Initiated Contact with Patient 10/15/14 1503     Chief Complaint  Patient presents with  . Diverticulitis  . Emesis   Patient is a 34 y.o. female presenting with vomiting. The history is provided by the patient.  Emesis Severity:  Moderate Timing:  Intermittent Progression:  Worsening Chronicity:  New Relieved by:  Nothing Exacerbated by: medications. Associated symptoms: abdominal pain   Associated symptoms: no diarrhea and no fever   Pt seen for abdominal pain on 10/10/14 and found to have diverticulitis She was started on outpatient medications including cipro/flagyl She reports she has intense nausea/vomiting due to medications She reports her abd pain is worseing (it is in right upper quadrant) No fever No diarrhea No rectal bleeding She reports mild cough No other complaints   Past Medical History  Diagnosis Date  . Sciatica   . Hypertension   . Breast cancer     left lumpectomy 2006  . Migraines   . Breast CA   . Uterine cancer   . Stroke 2013   Past Surgical History  Procedure Laterality Date  . Abdominal hysterectomy    . Breast lumpectomy Left   . Carpal tunnel release Right    No family history on file. History  Substance Use Topics  . Smoking status: Current Every Day Smoker -- 0.25 packs/day    Types: Cigarettes  . Smokeless tobacco: Not on file  . Alcohol Use: Yes     Comment: rare   OB History    No data available     Review of Systems  Constitutional: Negative for fever.  Respiratory: Positive for cough.   Gastrointestinal: Positive for vomiting and abdominal pain. Negative for diarrhea and blood in stool.  All other systems reviewed and are negative.     Allergies  Reglan; Toradol; and Zofran  Home Medications   Prior to Admission medications   Medication Sig Start Date End Date Taking? Authorizing Provider  acetaminophen (TYLENOL) 325 MG tablet Take 650  mg by mouth every 6 (six) hours as needed for headache.   Yes Historical Provider, MD  albuterol (PROVENTIL HFA;VENTOLIN HFA) 108 (90 BASE) MCG/ACT inhaler Inhale 1-2 puffs into the lungs every 6 (six) hours as needed for wheezing or shortness of breath.   Yes Historical Provider, MD  Aspirin-Salicylamide-Caffeine (BC HEADACHE POWDER PO) Take 1 each by mouth as needed (headache).   Yes Historical Provider, MD  butalbital-acetaminophen-caffeine [facility]) 50-325-40 MG per tablet Take 2 tablets by mouth every 4 (four) hours as needed for headache. 09/19/14  Yes Fransico Meadow, PA-C  carvedilol (COREG) 6.25 MG tablet Take 1 tablet (6.25 mg total) by mouth 2 (two) times daily with a meal. 09/05/14  Yes Clayton Bibles, PA-C  ciprofloxacin (CIPRO) 500 MG tablet Take 1 tablet (500 mg total) by mouth 2 (two) times daily. 10/10/14  Yes Junius Creamer, NP  HYDROcodone-acetaminophen (NORCO/VICODIN) 5-325 MG per tablet Take 1 tablet by mouth every 6 (six) hours as needed for moderate pain. 10/10/14  Yes Junius Creamer, NP  lisinopril-hydrochlorothiazide (PRINZIDE,ZESTORETIC) 20-12.5 MG per tablet Take 1 tablet by mouth daily. 09/05/14  Yes Clayton Bibles, PA-C  loratadine (CLARITIN) 10 MG tablet Take 1 tablet (10 mg total) by mouth daily. 09/05/14  Yes Clayton Bibles, PA-C  metroNIDAZOLE (FLAGYL) 500 MG tablet Take 1 tablet (500 mg total) by mouth 2 (two) times daily. 10/10/14  Yes Junius Creamer, NP  naproxen sodium (ANAPROX) 220 MG tablet Take 220 mg by mouth 2 (two) times daily with a meal.   Yes Historical Provider, MD  promethazine (PHENERGAN) 25 MG tablet Take 1 tablet (25 mg total) by mouth every 6 (six) hours as needed for nausea or vomiting. 10/10/14  Yes Junius Creamer, NP   BP 149/92 mmHg  Pulse 73  Temp(Src) 97.6 F (36.4 C) (Oral)  Resp 16  SpO2 100% Physical Exam CONSTITUTIONAL: Well developed/well nourished HEAD: Normocephalic/atraumatic EYES: EOMI/PERRL ENMT: Mucous membranes moist NECK: supple no meningeal  signs SPINE/BACK:entire spine nontender CV: S1/S2 noted, no murmurs/rubs/gallops noted LUNGS: Lungs are clear to auscultation bilaterally, no apparent distress ABDOMEN: soft, mild tenderness to RUQ, no rebound or guarding, bowel sounds noted throughout abdomen GU:no cva tenderness NEURO: Pt is awake/alert/appropriate, moves all extremitiesx4.  No facial droop.   EXTREMITIES: pulses normal/equal, full ROM SKIN: warm, color normal PSYCH: no abnormalities of mood noted, alert and oriented to situation  ED Course  Procedures  Medications  promethazine (PHENERGAN) tablet 25 mg (25 mg Oral Given 10/15/14 1618)  oxyCODONE-acetaminophen (PERCOCET/ROXICET) 5-325 MG per tablet 2 tablet (2 tablets Oral Given 10/15/14 1544)  amoxicillin-clavulanate (AUGMENTIN) 875-125 MG per tablet 1 tablet (1 tablet Oral Given 10/15/14 1544)    3:57 PM Pt well appearing and in no distress She can not tolerate cipro/flagyl Will stop those meds and start augmentin Other than mild RUQ tenderness no other focal tenderness, no peritoneal signs 4:46 PM Pt taking PO  Well appearing Will start augmentin Referred to GI for diverticulitis  Labs Review Labs Reviewed  CBC WITH DIFFERENTIAL/PLATELET - Abnormal; Notable for the following:    RBC 3.47 (*)    Hemoglobin 11.6 (*)    HCT 34.9 (*)    MCV 100.6 (*)    Platelets 422 (*)    All other components within normal limits  COMPREHENSIVE METABOLIC PANEL - Abnormal; Notable for the following:    BUN 24 (*)    Calcium 8.8 (*)    Total Protein 6.4 (*)    All other components within normal limits  URINALYSIS, ROUTINE W REFLEX MICROSCOPIC - Abnormal; Notable for the following:    APPearance CLOUDY (*)    Specific Gravity, Urine 1.004 (*)    Hgb urine dipstick LARGE (*)    Leukocytes, UA TRACE (*)    All other components within normal limits  URINE MICROSCOPIC-ADD ON - Abnormal; Notable for the following:    Squamous Epithelial / LPF MANY (*)    All other  components within normal limits  LIPASE, BLOOD      MDM   Final diagnoses:  Diverticulitis of intestine without perforation or abscess without bleeding    Nursing notes including past medical history and social history reviewed and considered in documentation Labs/vital reviewed myself and considered during evaluation Previous records reviewed and considered     Ripley Fraise, MD 10/15/14 1646

## 2014-10-15 NOTE — ED Notes (Signed)
MD at bedside. 

## 2014-10-15 NOTE — ED Notes (Signed)
Pt c/o Severe R side abdominal pain x 1 week. Pt was seen here "a few days ago" and was diagnosed with diverticulitis. Pt returns to ED with worsening pain and sts that she cannot take the antibiotics prescribed because she is nauseous and vomiting. Pt was also given pain medication which she has run out of. Pt denies having any difficulty taking the pain pills, just the antibiotics. Pt also c/o abdominal distention. Denies diarrhea, fevers. A&Ox4 and ambulatory.

## 2014-10-15 NOTE — Discharge Instructions (Signed)
Diverticulitis °Diverticulitis is when small pockets that have formed in your colon (large intestine) become infected or swollen. °HOME CARE °· Follow your doctor's instructions. °· Follow a special diet if told by your doctor. °· When you feel better, your doctor may tell you to change your diet. You may be told to eat a lot of fiber. Fruits and vegetables are good sources of fiber. Fiber makes it easier to poop (have bowel movements). °· Take supplements or probiotics as told by your doctor. °· Only take medicines as told by your doctor. °· Keep all follow-up visits with your doctor. °GET HELP IF: °· Your pain does not get better. °· You have a hard time eating food. °· You are not pooping like normal. °GET HELP RIGHT AWAY IF: °· Your pain gets worse. °· Your problems do not get better. °· Your problems suddenly get worse. °· You have a fever. °· You keep throwing up (vomiting). °· You have bloody or black, tarry poop (stool). °MAKE SURE YOU:  °· Understand these instructions. °· Will watch your condition. °· Will get help right away if you are not doing well or get worse. °Document Released: 11/08/2007 Document Revised: 05/27/2013 Document Reviewed: 04/16/2013 °ExitCare® Patient Information ©2015 [employer]. This information is not intended to replace advice given to you by your health care provider. Make sure you discuss any questions you have with your health care provider. ° °

## 2014-10-18 ENCOUNTER — Emergency Department (INDEPENDENT_AMBULATORY_CARE_PROVIDER_SITE_OTHER)
Admission: EM | Admit: 2014-10-18 | Discharge: 2014-10-18 | Disposition: A | Discharge status: Home / Self Care | Payer: Medicaid Other | Source: Home / Self Care | Attending: Family Medicine | Admitting: Family Medicine

## 2014-10-18 ENCOUNTER — Encounter (HOSPITAL_COMMUNITY): Payer: Self-pay | Admitting: *Deleted

## 2014-10-18 DIAGNOSIS — I10 Essential (primary) hypertension: Secondary | ICD-10-CM | POA: Diagnosis not present

## 2014-10-18 MED ORDER — CARVEDILOL 6.25 MG PO TABS: 6.2500 mg | ORAL_TABLET | Freq: Two times a day (BID) | ORAL | Status: DC

## 2014-10-18 MED ORDER — LISINOPRIL-HYDROCHLOROTHIAZIDE 20-12.5 MG PO TABS: 1.0000 | ORAL_TABLET | Freq: Every day | ORAL | Status: DC

## 2014-10-18 NOTE — Discharge Instructions (Signed)
See your doctor for further refills of medicines.

## 2014-10-18 NOTE — ED Provider Notes (Signed)
CSN: [id number]     Arrival date & time 10/18/14  1659 History   First MD Initiated Contact with Patient 10/18/14 1744     Chief Complaint  Patient presents with  . Medication Refill   (Consider location/radiation/quality/duration/timing/severity/associated sxs/prior Treatment) Patient is a 34 y.o. female presenting with hypertension. The history is provided by the patient.  Hypertension This is a chronic problem. The current episode started more than 1 week ago. The problem has not changed (pt here for refill on bp meds. no local md.) since onset.Pertinent negatives include no chest pain and no abdominal pain.    Past Medical History  Diagnosis Date  . Sciatica   . Hypertension   . Breast cancer     left lumpectomy 2006  . Migraines   . Breast CA   . Uterine cancer   . Stroke 2013   Past Surgical History  Procedure Laterality Date  . Abdominal hysterectomy    . Breast lumpectomy Left   . Carpal tunnel release Right    History reviewed. No pertinent family history. History  Substance Use Topics  . Smoking status: Current Every Day Smoker -- 0.25 packs/day    Types: Cigarettes  . Smokeless tobacco: Not on file  . Alcohol Use: Yes     Comment: rare   OB History    No data available     Review of Systems  Constitutional: Negative.   Respiratory: Negative.   Cardiovascular: Negative.  Negative for chest pain.  Gastrointestinal: Negative for abdominal pain.    Allergies  Reglan; Toradol; and Zofran  Home Medications   Prior to Admission medications   Medication Sig Start Date End Date Taking? Authorizing Provider  acetaminophen (TYLENOL) 325 MG tablet Take 650 mg by mouth every 6 (six) hours as needed for headache.    Historical Provider, MD  albuterol (PROVENTIL HFA;VENTOLIN HFA) 108 (90 BASE) MCG/ACT inhaler Inhale 1-2 puffs into the lungs every 6 (six) hours as needed for wheezing or shortness of breath.    Historical Provider, MD  amoxicillin-clavulanate  (AUGMENTIN) 875-125 MG per tablet Take 1 tablet by mouth 2 (two) times daily. One po bid x 7 days 10/15/14   Ripley Fraise, MD  carvedilol (COREG) 6.25 MG tablet Take 1 tablet (6.25 mg total) by mouth 2 (two) times daily with a meal. 10/18/14   Billy Fischer, MD  lisinopril-hydrochlorothiazide (PRINZIDE,ZESTORETIC) 20-12.5 MG per tablet Take 1 tablet by mouth daily. 10/18/14   Billy Fischer, MD  loratadine (CLARITIN) 10 MG tablet Take 1 tablet (10 mg total) by mouth daily. 09/05/14   Clayton Bibles, PA-C  naproxen sodium (ANAPROX) 220 MG tablet Take 220 mg by mouth 2 (two) times daily with a meal.    Historical Provider, MD  oxyCODONE-acetaminophen (PERCOCET/ROXICET) 5-325 MG per tablet Take 1 tablet by mouth every 4 (four) hours as needed for severe pain. 10/15/14   Ripley Fraise, MD  promethazine (PHENERGAN) 25 MG tablet Take 1 tablet (25 mg total) by mouth every 6 (six) hours as needed for nausea or vomiting. 10/15/14   Ripley Fraise, MD   BP 172/94 mmHg  Pulse 67  Temp(Src) 98 F (36.7 C) (Oral)  Resp 16  SpO2 100% Physical Exam  Constitutional: She is oriented to person, place, and time. She appears well-developed and well-nourished.  Neck: Normal range of motion. Neck supple.  Cardiovascular: Regular rhythm, normal heart sounds and intact distal pulses.   Pulmonary/Chest: Effort normal and breath sounds normal.  Musculoskeletal: She exhibits  no edema.  Lymphadenopathy:    She has no cervical adenopathy.  Neurological: She is alert and oriented to person, place, and time.  Skin: Skin is warm and dry.  Nursing note and vitals reviewed.   ED Course  Procedures (including critical care time) Labs Review Labs Reviewed - No data to display  Imaging Review No results found.   MDM   1. Essential hypertension        Billy Fischer, MD 10/18/14 [phone]

## 2014-10-18 NOTE — ED Notes (Signed)
Pt  Is  Asking  For  Med  Refills   She  States  Has  [place] pcp [place]      She  Reports   She  Has  Relocated from   [place]

## 2014-10-19 ENCOUNTER — Ambulatory Visit: Payer: Self-pay | Admitting: Family Medicine

## 2014-10-28 ENCOUNTER — Encounter (HOSPITAL_COMMUNITY): Payer: Self-pay | Admitting: Emergency Medicine

## 2014-10-28 ENCOUNTER — Emergency Department (INDEPENDENT_AMBULATORY_CARE_PROVIDER_SITE_OTHER)
Admission: EM | Admit: 2014-10-28 | Discharge: 2014-10-28 | Disposition: A | Discharge status: Home / Self Care | Payer: Medicaid Other | Source: Home / Self Care | Attending: Family Medicine | Admitting: Family Medicine

## 2014-10-28 DIAGNOSIS — S39012A Strain of muscle, fascia and tendon of lower back, initial encounter: Secondary | ICD-10-CM

## 2014-10-28 DIAGNOSIS — S76312A Strain of muscle, fascia and tendon of the posterior muscle group at thigh level, left thigh, initial encounter: Secondary | ICD-10-CM

## 2014-10-28 MED ORDER — ETODOLAC 400 MG PO TABS: 400.0000 mg | ORAL_TABLET | Freq: Two times a day (BID) | ORAL | Status: DC

## 2014-10-28 NOTE — ED Provider Notes (Signed)
Felicia Moore is a 34 y.o. female who presents to Urgent Care today for left knee pain. Patient stepped in a hole injuring her left knee and low back today. She notes pain along the medial posterior knee worse with knee flexion. She denies any radiating pain weakness or numbness fevers or chills. She has tried naproxen which didn't help much. She would like more of her tramadol that she takes for her chronic back pain.   Past Medical History  Diagnosis Date  . Sciatica   . Hypertension   . Breast cancer     left lumpectomy 2006  . Migraines   . Breast CA   . Uterine cancer   . Stroke 2013   Past Surgical History  Procedure Laterality Date  . Abdominal hysterectomy    . Breast lumpectomy Left   . Carpal tunnel release Right    History  Substance Use Topics  . Smoking status: Current Every Day Smoker -- 0.25 packs/day    Types: Cigarettes  . Smokeless tobacco: Not on file  . Alcohol Use: Yes     Comment: rare   ROS as above Medications: No current facility-administered medications for this encounter.   Current Outpatient Prescriptions  Medication Sig Dispense Refill  . acetaminophen (TYLENOL) 325 MG tablet Take 650 mg by mouth every 6 (six) hours as needed for headache.    . albuterol (PROVENTIL HFA;VENTOLIN HFA) 108 (90 BASE) MCG/ACT inhaler Inhale 1-2 puffs into the lungs every 6 (six) hours as needed for wheezing or shortness of breath.    Marland Kitchen amoxicillin-clavulanate (AUGMENTIN) 875-125 MG per tablet Take 1 tablet by mouth 2 (two) times daily. One po bid x 7 days 14 tablet 0  . carvedilol (COREG) 6.25 MG tablet Take 1 tablet (6.25 mg total) by mouth 2 (two) times daily with a meal. 60 tablet 0  . etodolac (LODINE) 400 MG tablet Take 1 tablet (400 mg total) by mouth 2 (two) times daily. 30 tablet 0  . lisinopril-hydrochlorothiazide (PRINZIDE,ZESTORETIC) 20-12.5 MG per tablet Take 1 tablet by mouth daily. 30 tablet 0  . loratadine (CLARITIN) 10 MG tablet Take 1 tablet (10 mg  total) by mouth daily. 30 tablet 0  . promethazine (PHENERGAN) 25 MG tablet Take 1 tablet (25 mg total) by mouth every 6 (six) hours as needed for nausea or vomiting. 30 tablet 0   Allergies  Allergen Reactions  . Reglan [Metoclopramide] Other (See Comments)    Unknown reaction  . Toradol [Ketorolac Tromethamine] Other (See Comments)    Unknown reaction  . Zofran [Ondansetron Hcl] Other (See Comments)    Unknown reaction     Exam:  BP 117/76 mmHg  Pulse 98  Temp(Src) 98.2 F (36.8 C) (Oral)  Resp 16  SpO2 100% Gen: Well NAD HEENT: EOMI,  MMM Lungs: Normal work of breathing. CTABL Heart: RRR no MRG Abd: NABS, Soft. Nondistended, Nontender Exts: Brisk capillary refill, warm and well perfused.  Left knee normal-appearing no effusion. Tender palpation insertion of the medial hamstring tendons. Stable ligaments exam negative McMurray's test. Pain with resisted knee flexion. Normal knee range of motion. Intact extension and flexion strength.  back: Tender palpation left lumbar paraspinal. Nontender to spinal midline normal back range of motion normal gait. Lower extremity strength and sensation are equal and normal bilaterally.  No results found for this or any previous visit (from the past 24 hour(s)). No results found.  Assessment and Plan: 34 y.o. female with  1) left hamstring strain. No significant tear.  Treat with hamstring range of motion exercises as well as etodolac.  Avoid tramadol. Patient has had multiple refills of narcotic pain medications recently 2) back pain. See above. NSAIDs range of motion exercises home exercise program Follow up with PCP or sports medicine orthopedics.  Discussed warning signs or symptoms. Please see discharge instructions. Patient expresses understanding.     Gregor Hams, MD 10/28/14 2031

## 2014-10-28 NOTE — ED Notes (Signed)
C/o left hip and knee this morning States she stepped into a hole  Aleve was used as tx

## 2014-10-28 NOTE — Discharge Instructions (Signed)
Thank you for coming in today.  Hamstring Strain with Rehab The hamstring muscle and tendons are vulnerable to muscle or tendon tear (strain). Hamstring tears cause pain and inflammation in the backside of the thigh, where the hamstring muscles are located. The hamstring is comprised of three muscles that are responsible for straightening the hip, bending the knee, and stabilizing the knee. These muscles are important for walking, running, and jumping. Hamstring strain is the most common injury of the thigh. Hamstring strains are classified as grade 1 or 2 strains. Grade 1 strains cause pain, but the tendon is not lengthened. Grade 2 strains include a lengthened ligament due to the ligament being stretched or partially ruptured. With grade 2 strains there is still function, although the function may be decreased.  SYMPTOMS   Pain, tenderness, swelling, warmth, or redness over the hamstring muscles, at the back of the thigh.  Pain that gets worse during and after intense activity.  A "pop" heard in the area, at the time of injury.  Muscle spasm in the hamstring muscles.  Pain or weakness with running, jumping, or bending the knee against resistance.  Crackling sound (crepitation) when the tendon is moved or touched.  Bruising (contusion) in the thigh within 48 hours of injury.  Loss of fullness of the muscle, or area of muscle bulging in the case of a complete rupture. CAUSES  A muscle strain occurs when a force is placed on the muscle or tendon that is greater than it can withstand. Common causes of injury include:  Strain from overuse or sudden increase in the frequency, duration, or intensity of activity.  Single violent blow or force to the back of the knee or the hamstring area of the thigh. RISK INCREASES WITH:  Sports that require quick starts (sprinting, racquetball, tennis).  Sports that require jumping (basketball, volleyball).  Kicking sports and water skiing.  Contact  sports (soccer, football).  Poor strength and flexibility.  Failure to warm up properly before activity.  Previous thigh, knee, or pelvis injury.  Poor exercise technique.  Poor posture. PREVENTION  Maintain physical fitness:  Strength, flexibility, and endurance.  Cardiovascular fitness.  Learn and use proper exercise technique and posture.  Wear proper fitted and padded protective equipment. PROGNOSIS  If treated properly, hamstring strains are usually curable in 2 to 6 weeks. RELATED COMPLICATIONS   Longer healing time, if not properly treated or if not given adequate time to heal.  Chronically inflamed tendon, causing persistent pain with activity that may progress to constant pain.  Recurring symptoms, if activity is resumed too soon.  Vulnerable to repeated injury (in up to 25% of cases). TREATMENT  Treatment first involves the use of ice and medication to help reduce pain and inflammation. It is also important to complete strengthening and stretching exercises, as well as modifying any activities that aggravate the symptoms. These exercises may be completed at home or with a therapist. Your caregiver may recommend the use of crutches to help reduce pain and discomfort, especially is the strain is severe enough to cause limping. If the tendon has pulled away from the bone, then surgery is necessary to reattach it. MEDICATION   If pain medicine is needed, nonsteroidal anti-inflammatory medicines (aspirin and ibuprofen), or other minor pain relievers (acetaminophen), are often advised.  Do not take pain medicine for 7 days before surgery.  Prescription pain relievers may be given if your caregiver thinks they are needed. Use only as directed and only as much as  you need.  Corticosteroid injections may be recommended. However, these injections should only be used for serious cases, as they can only be given a certain number of times.  Ointments applied to the skin may  be beneficial. HEAT AND COLD  Cold treatment (icing) relieves pain and reduces inflammation. Cold treatment should be applied for 10 to 15 minutes every 2 to 3 hours, and immediately after activity that aggravates your symptoms. Use ice packs or an ice massage.  Heat treatment may be used before performing the stretching and strengthening activities prescribed by your caregiver, physical therapist, or athletic trainer. Use a heat pack or a warm water soak. SEEK MEDICAL CARE IF:   Symptoms get worse or do not improve in 2 weeks, despite treatment.  New, unexplained symptoms develop. (Drugs used in treatment may produce side effects.) EXERCISES RANGE OF MOTION (ROM) AND STRETCHING EXERCISES - Hamstring Strain These exercises may help you when beginning to rehabilitate your injury. Your symptoms may go away with or without further involvement from your physician, physical therapist or athletic trainer. While completing these exercises, remember:   Restoring tissue flexibility helps normal motion to return to the joints. This allows healthier, less painful movement and activity.  An effective stretch should be held for at least 30 seconds.  A stretch should never be painful. You should only feel a gentle lengthening or release in the stretched tissue. STRETCH - Hamstrings, Standing  Stand or sit, and extend your right / left leg, placing your foot on a chair or foot stool.  Keep a slight arch in your low back and your hips straight forward.  Lead with your chest, and lean forward at the waist until you feel a gentle stretch in the back of your right / left knee or thigh. (When done correctly, this exercise requires leaning only a small distance.)  Hold this position for __________ seconds. Repeat __________ times. Complete this stretch __________ times per day. STRETCH - Hamstrings, Supine   Lie on your back. Loop a belt or towel over the ball of your right / left foot.  Straighten your  right / left knee and slowly pull on the belt to raise your leg. Do not allow the right / left knee to bend. Keep your opposite leg flat on the floor.  Raise the leg until you feel a gentle stretch behind your right / left knee or thigh. Hold this position for __________ seconds. Repeat __________ times. Complete this stretch __________ times per day.  STRETCH - Hamstrings, Doorway  Lie on your back with your right / left leg extended and resting on the wall, and the opposite leg flat on the ground through the door. Initially, position your bottom farther away from the wall.  Keep your right / left knee straight. If you feel a stretch behind your knee or thigh, hold this position for __________ seconds.  If you do not feel a stretch, scoot your bottom closer to the door and hold __________ seconds. Repeat __________ times. Complete this stretch __________ times per day.  STRETCH - Hamstrings/Adductors, V-Sit   Sit on the floor with your legs extended in a large "V," keeping your knees straight.  With your head and chest upright, bend at your waist reaching for your left foot to stretch your right thigh muscles.  You should feel a stretch in your right inner thigh. Hold for __________ seconds.  Return to the upright position to relax your leg muscles.  Continuing to keep your  chest upright, bend straight forward at your waist to stretch your hamstrings.  You should feel a stretch behind both of your thighs and knees. Hold for __________ seconds.  Return to the upright position to relax your leg muscles.  With your head and chest upright, bend at your waist reaching for your right foot to stretch your left thigh muscles.  You should feel a stretch in your left inner thigh. Hold for __________ seconds.  Return to the upright position to relax your leg muscles. Repeat __________ times. Complete this exercise __________ times per day.  STRENGTHENING EXERCISES - Hamstring Strain These  exercises may help you when beginning to rehabilitate your injury. They may resolve your symptoms with or without further involvement from your physician, physical therapist or athletic trainer. While completing these exercises, remember:   Muscles can gain both the endurance and the strength needed for everyday activities through controlled exercises.  Complete these exercises as instructed by your physician, physical therapist or athletic trainer. Increase the resistance and repetitions only as guided.  You may experience muscle soreness or fatigue, but the pain or discomfort you are trying to eliminate should never get worse during these exercises. If this pain does get worse, stop and make certain you are following the directions exactly. If the pain is still present after adjustments, discontinue the exercise until you can discuss the trouble with your clinician. STRENGTH - Hip Extensors, Straight Leg Raises   Lie on your stomach on a firm surface.  Tense the muscles in your buttocks to lift your right / left leg about 4 inches. If you cannot lift your leg this high without arching your back, place a pillow under your hips.  Keep your knee straight. Hold for __________ seconds.  Slowly lower your leg to the starting position and allow it to relax completely before starting the next repetition. Repeat __________ times. Complete this exercise __________ times per day.  STRENGTH - Hamstring, Isometrics   Lie on your back on a firm surface.  Bend your right / left knee approximately __________ degrees.  Dig your heel into the surface, as if you are trying to pull it toward your buttocks. Tighten the muscles in the back of your thighs to "dig" as hard as you can, without increasing any pain.  Hold this position for __________ seconds.  Release the tension gradually and allow your muscles to completely relax for __________ seconds between each exercise. Repeat __________ times. Complete  this exercise __________ times per day.  STRENGTH - Hamstring, Curls   Lay on your stomach with your legs extended. (If you lay on a bed, your feet may hang over the edge.)  Tighten the muscles in the back of your thigh to bend your right / left knee up to 90 degrees. Keep your hips flat on the bed or floor.  Hold this position for __________ seconds.  Slowly lower your leg back to the starting position. Repeat __________ times. Complete this exercise __________ times per day.  OPTIONAL ANKLE WEIGHTS: Begin with ____________________, but DO NOT exceed ____________________. Increase in 1 pound/0.5 kilogram increments. Document Released: 05/22/2005 Document Revised: 08/14/2011 Document Reviewed: 09/03/2008 [facility] Patient Information 2015 [place], [place]. This information is not intended to replace advice given to you by your health care provider. Make sure you discuss any questions you have with your health care provider.

## 2014-10-31 ENCOUNTER — Emergency Department (HOSPITAL_COMMUNITY)
Admission: EM | Admit: 2014-10-31 | Discharge: 2014-10-31 | Disposition: A | Discharge status: Home / Self Care | Payer: Medicaid Other | Attending: Emergency Medicine | Admitting: Emergency Medicine

## 2014-10-31 ENCOUNTER — Encounter (HOSPITAL_COMMUNITY): Payer: Self-pay | Admitting: Emergency Medicine

## 2014-10-31 DIAGNOSIS — G43909 Migraine, unspecified, not intractable, without status migrainosus: Secondary | ICD-10-CM | POA: Diagnosis not present

## 2014-10-31 DIAGNOSIS — I10 Essential (primary) hypertension: Secondary | ICD-10-CM | POA: Insufficient documentation

## 2014-10-31 DIAGNOSIS — M62831 Muscle spasm of calf: Secondary | ICD-10-CM | POA: Insufficient documentation

## 2014-10-31 DIAGNOSIS — Z720 Tobacco use: Secondary | ICD-10-CM | POA: Insufficient documentation

## 2014-10-31 DIAGNOSIS — Z791 Long term (current) use of non-steroidal anti-inflammatories (NSAID): Secondary | ICD-10-CM | POA: Insufficient documentation

## 2014-10-31 DIAGNOSIS — M549 Dorsalgia, unspecified: Secondary | ICD-10-CM | POA: Diagnosis not present

## 2014-10-31 DIAGNOSIS — M62838 Other muscle spasm: Secondary | ICD-10-CM | POA: Insufficient documentation

## 2014-10-31 DIAGNOSIS — Z79899 Other long term (current) drug therapy: Secondary | ICD-10-CM | POA: Insufficient documentation

## 2014-10-31 DIAGNOSIS — Z853 Personal history of malignant neoplasm of breast: Secondary | ICD-10-CM | POA: Diagnosis not present

## 2014-10-31 DIAGNOSIS — Z8542 Personal history of malignant neoplasm of other parts of uterus: Secondary | ICD-10-CM | POA: Insufficient documentation

## 2014-10-31 DIAGNOSIS — Z8673 Personal history of transient ischemic attack (TIA), and cerebral infarction without residual deficits: Secondary | ICD-10-CM | POA: Diagnosis not present

## 2014-10-31 DIAGNOSIS — Z792 Long term (current) use of antibiotics: Secondary | ICD-10-CM | POA: Diagnosis not present

## 2014-10-31 DIAGNOSIS — M542 Cervicalgia: Secondary | ICD-10-CM | POA: Diagnosis present

## 2014-10-31 MED ORDER — METHOCARBAMOL 500 MG PO TABS
500.0000 mg | ORAL_TABLET | Freq: Once | ORAL | Status: AC
  Administered 2014-10-31: 500 mg via ORAL
  Filled 2014-10-31: qty 1

## 2014-10-31 MED ORDER — METHOCARBAMOL 500 MG PO TABS: 500.0000 mg | ORAL_TABLET | Freq: Two times a day (BID) | ORAL | Status: DC

## 2014-10-31 NOTE — Discharge Instructions (Signed)
Please follow up with your primary care physician in 1-2 days. If you do not have one please call the [facility] number listed above. Please take pain medication and/or muscle relaxants as prescribed and as needed for pain. Please do not drive on narcotic pain medication or on muscle relaxants. Please read all discharge instructions and return precautions.   Muscle Cramps and Spasms Muscle cramps and spasms occur when a muscle or muscles tighten and you have no control over this tightening (involuntary muscle contraction). They are a common problem and can develop in any muscle. The most common place is in the calf muscles of the leg. Both muscle cramps and muscle spasms are involuntary muscle contractions, but they also have differences:   Muscle cramps are sporadic and painful. They may last a few seconds to a quarter of an hour. Muscle cramps are often more forceful and last longer than muscle spasms.  Muscle spasms may or may not be painful. They may also last just a few seconds or much longer. CAUSES  It is uncommon for cramps or spasms to be due to a serious underlying problem. In many cases, the cause of cramps or spasms is unknown. Some common causes are:   Overexertion.   Overuse from repetitive motions (doing the same thing over and over).   Remaining in a certain position for a long period of time.   Improper preparation, form, or technique while performing a sport or activity.   Dehydration.   Injury.   Side effects of some medicines.   Abnormally low levels of the salts and ions in your blood (electrolytes), especially potassium and calcium. This could happen if you are taking water pills (diuretics) or you are pregnant.  Some underlying medical problems can make it more likely to develop cramps or spasms. These include, but are not limited to:   Diabetes.   Parkinson disease.   Hormone disorders, such as thyroid problems.   Alcohol  abuse.   Diseases specific to muscles, joints, and bones.   Blood vessel disease where not enough blood is getting to the muscles.  HOME CARE INSTRUCTIONS   Stay well hydrated. Drink enough water and fluids to keep your urine clear or pale yellow.  It may be helpful to massage, stretch, and relax the affected muscle.  For tight or tense muscles, use a warm towel, heating pad, or hot shower water directed to the affected area.  If you are sore or have pain after a cramp or spasm, applying ice to the affected area may relieve discomfort.  Put ice in a plastic bag.  Place a towel between your skin and the bag.  Leave the ice on for 15-20 minutes, 03-04 times a day.  Medicines used to treat a known cause of cramps or spasms may help reduce their frequency or severity. Only take over-the-counter or prescription medicines as directed by your caregiver. SEEK MEDICAL CARE IF:  Your cramps or spasms get more severe, more frequent, or do not improve over time.  MAKE SURE YOU:   Understand these instructions.  Will watch your condition.  Will get help right away if you are not doing well or get worse. Document Released: 11/11/2001 Document Revised: 09/16/2012 Document Reviewed: 05/08/2012 [facility] Patient Information 2015 [place], [place]. This information is not intended to replace advice given to you by your health care provider. Make sure you discuss any questions you have with your health care provider.

## 2014-10-31 NOTE — ED Provider Notes (Signed)
CSN: [id number]     Arrival date & time 10/31/14  1716 History  This chart was scribed for Baron Sane, PA-C working with No att. providers found by Mercy Moore, ED Scribe. This patient was seen in room WTR8/WTR8 and the patient's care was started at 5:36 PM.   Chief Complaint  Patient presents with  . Leg Pain  . Neck Pain   Patient is a 34 y.o. female presenting with leg pain and neck pain. The history is provided by the patient. No language interpreter was used.  Leg Pain Location:  Leg Time since incident:  3 days Injury: yes   Leg location:  L upper leg Pain details:    Radiates to:  Does not radiate Worsened by:  Extension Ineffective treatments:  NSAIDs Associated symptoms: back pain and neck pain   Associated symptoms: no fever   Neck Pain Associated symptoms: leg pain   Associated symptoms: no fever, no numbness and no weakness    HPI Comments: Felicia Moore is a 34 y.o. female with PMHx of sciatica who presents to the Emergency Department complaining of unresolved left leg pain after an injury incurred three days ago. Patient reports forcefully stepping into hole when throwing bails of hay and injuring her left leg and left lower back. Patient evaluated at [facility] for the complaint where she was diagnosed with left hamstring strain and lumbar strain and discharged with exercise recommendations and anti inflammatories. Patient denies relief with treatment and states that her pain has worsened since her visit. Patient reports worsening medial and posterior thigh pain and development of pain in her calf. Patient reports pulling pain in leg with extension at knee and tingling in her leg after duration of immobility. Patient also reports right neck pain which she attributes to having her "whole body jarred" when stepping into the hole.   Past Medical History  Diagnosis Date  . Sciatica   . Hypertension   . Breast cancer     left lumpectomy 2006  . Migraines   . Breast  CA   . Uterine cancer   . Stroke 2013   Past Surgical History  Procedure Laterality Date  . Abdominal hysterectomy    . Breast lumpectomy Left   . Carpal tunnel release Right    No family history on file. History  Substance Use Topics  . Smoking status: Current Every Day Smoker -- 0.25 packs/day    Types: Cigarettes  . Smokeless tobacco: Not on file  . Alcohol Use: Yes     Comment: rare   OB History    No data available     Review of Systems  Constitutional: Negative for fever.  Musculoskeletal: Positive for back pain and neck pain.  Skin: Negative for color change.  Neurological: Negative for weakness and numbness.  All other systems reviewed and are negative.     Allergies  Reglan; Toradol; and Zofran  Home Medications   Prior to Admission medications   Medication Sig Start Date End Date Taking? Authorizing Provider  acetaminophen (TYLENOL) 325 MG tablet Take 650 mg by mouth every 6 (six) hours as needed for headache.    Historical Provider, MD  albuterol (PROVENTIL HFA;VENTOLIN HFA) 108 (90 BASE) MCG/ACT inhaler Inhale 1-2 puffs into the lungs every 6 (six) hours as needed for wheezing or shortness of breath.    Historical Provider, MD  amoxicillin-clavulanate (AUGMENTIN) 875-125 MG per tablet Take 1 tablet by mouth 2 (two) times daily. One po bid x 7 days 10/15/14  Ripley Fraise, MD  carvedilol (COREG) 6.25 MG tablet Take 1 tablet (6.25 mg total) by mouth 2 (two) times daily with a meal. 10/18/14   Billy Fischer, MD  etodolac (LODINE) 400 MG tablet Take 1 tablet (400 mg total) by mouth 2 (two) times daily. 10/28/14   Gregor Hams, MD  lisinopril-hydrochlorothiazide (PRINZIDE,ZESTORETIC) 20-12.5 MG per tablet Take 1 tablet by mouth daily. 10/18/14   Billy Fischer, MD  loratadine (CLARITIN) 10 MG tablet Take 1 tablet (10 mg total) by mouth daily. 09/05/14   Clayton Bibles, PA-C  methocarbamol (ROBAXIN) 500 MG tablet Take 1 tablet (500 mg total) by mouth 2 (two) times daily.  10/31/14   Tanaysha Emari Demmer, PA-C  promethazine (PHENERGAN) 25 MG tablet Take 1 tablet (25 mg total) by mouth every 6 (six) hours as needed for nausea or vomiting. 10/15/14   Ripley Fraise, MD   Triage Vitals: BP 140/95 mmHg  Pulse 95  Temp(Src) 98.3 F (36.8 C) (Oral)  Resp 16  SpO2 99% Physical Exam  Constitutional: She is oriented to person, place, and time. She appears well-developed and well-nourished. No distress.  HENT:  Head: Normocephalic and atraumatic.  Right Ear: External ear normal.  Left Ear: External ear normal.  Nose: Nose normal.  Mouth/Throat: Oropharynx is clear and moist. No oropharyngeal exudate.  Eyes: Conjunctivae and EOM are normal. Pupils are equal, round, and reactive to light.  Neck: Normal range of motion. Neck supple. Muscular tenderness present. No spinous process tenderness present.  Cardiovascular: Normal rate, regular rhythm, normal heart sounds and intact distal pulses.   Pulmonary/Chest: Effort normal and breath sounds normal. No respiratory distress.  Abdominal: Soft. There is no tenderness.  Musculoskeletal: Normal range of motion.       Left knee: Tenderness (inserstion of medial hamstring tendon) found.       Thoracic back: She exhibits no tenderness and no bony tenderness.       Lumbar back: She exhibits no tenderness and no bony tenderness.       Left upper leg: She exhibits tenderness. She exhibits no swelling, no edema and no deformity.       Left lower leg: She exhibits tenderness. She exhibits no bony tenderness, no swelling, no edema, no deformity and no laceration.  Spasm to hamstring noted.  Sensation intact to left leg  Neurological: She is alert and oriented to person, place, and time. She has normal strength. No cranial nerve deficit. Gait normal. GCS eye subscore is 4. GCS verbal subscore is 5. GCS motor subscore is 6.  Sensation grossly intact.  No pronator drift.  Bilateral heel-knee-shin intact.  Skin: Skin is warm and dry.  She is not diaphoretic.  Nursing note and vitals reviewed.   ED Course  Procedures (including critical care time) Medications  methocarbamol (ROBAXIN) tablet 500 mg (500 mg Oral Given 10/31/14 1758)    COORDINATION OF CARE: 5:45 PM- Discussed treatment plan with patient at bedside and patient agreed to plan.   Labs Review Labs Reviewed - No data to display  Imaging Review No results found.   EKG Interpretation None      MDM   Final diagnoses:  Muscle spasm of left lower extremity  Muscle spasms of neck    Filed Vitals:   10/31/14 1721  BP: 140/95  Pulse: 95  Temp: 98.3 F (36.8 C)  Resp: 16   Afebrile, NAD, non-toxic appearing, AAOx4.  Neurovascularly intact. Normal sensation. No evidence of compartment syndrome. Spasm appreciated to hamstring  and calf. No swelling, edema or deformity. ROM intact. Able to ambulate. Pain managed in ED. Pt advised to follow up with PCP if symptoms persist for possibility of missed fracture diagnosis. Patient given robaxin while in ED, conservative therapy recommended and discussed. Patient will be dc home &  is agreeable with above plan.     I personally performed the services described in this documentation, which was scribed in my presence. The recorded information has been reviewed and is accurate.     Tachina Spoonemore, PA-C 10/31/14 1812  Dorie Rank, MD 11/04/14 1440

## 2014-10-31 NOTE — ED Notes (Signed)
Pt c/o L leg pain and R neck pain since Wednesday. Pt sts she stepped in a hole at work on Wednesday and it "jarred her whole body." Pt sts she went to urgent care and was diagnosed with a pulled hamstring and given antiinflammatories that have "only made the pain worse." Pt sts pain is mostly in her calf and thigh. Pt A&Ox4 and ambulatory. Denies swelling or deformity.

## 2014-11-08 ENCOUNTER — Emergency Department (HOSPITAL_COMMUNITY)
Admission: EM | Admit: 2014-11-08 | Discharge: 2014-11-08 | Disposition: A | Discharge status: Home / Self Care | Payer: Medicaid Other | Attending: Emergency Medicine | Admitting: Emergency Medicine

## 2014-11-08 ENCOUNTER — Encounter (HOSPITAL_COMMUNITY): Payer: Self-pay | Admitting: *Deleted

## 2014-11-08 DIAGNOSIS — Z791 Long term (current) use of non-steroidal anti-inflammatories (NSAID): Secondary | ICD-10-CM | POA: Insufficient documentation

## 2014-11-08 DIAGNOSIS — I10 Essential (primary) hypertension: Secondary | ICD-10-CM | POA: Insufficient documentation

## 2014-11-08 DIAGNOSIS — Z8673 Personal history of transient ischemic attack (TIA), and cerebral infarction without residual deficits: Secondary | ICD-10-CM | POA: Diagnosis not present

## 2014-11-08 DIAGNOSIS — Z720 Tobacco use: Secondary | ICD-10-CM | POA: Insufficient documentation

## 2014-11-08 DIAGNOSIS — Z8542 Personal history of malignant neoplasm of other parts of uterus: Secondary | ICD-10-CM | POA: Diagnosis not present

## 2014-11-08 DIAGNOSIS — Z853 Personal history of malignant neoplasm of breast: Secondary | ICD-10-CM | POA: Diagnosis not present

## 2014-11-08 DIAGNOSIS — M5430 Sciatica, unspecified side: Secondary | ICD-10-CM | POA: Insufficient documentation

## 2014-11-08 DIAGNOSIS — M546 Pain in thoracic spine: Secondary | ICD-10-CM | POA: Insufficient documentation

## 2014-11-08 DIAGNOSIS — Z792 Long term (current) use of antibiotics: Secondary | ICD-10-CM | POA: Diagnosis not present

## 2014-11-08 DIAGNOSIS — L309 Dermatitis, unspecified: Secondary | ICD-10-CM | POA: Diagnosis not present

## 2014-11-08 DIAGNOSIS — Z79899 Other long term (current) drug therapy: Secondary | ICD-10-CM | POA: Insufficient documentation

## 2014-11-08 DIAGNOSIS — R21 Rash and other nonspecific skin eruption: Secondary | ICD-10-CM | POA: Diagnosis present

## 2014-11-08 MED ORDER — PREDNISONE 20 MG PO TABS: ORAL_TABLET | ORAL | Status: DC

## 2014-11-08 MED ORDER — TRAMADOL HCL 50 MG PO TABS
50.0000 mg | ORAL_TABLET | Freq: Once | ORAL | Status: AC
  Administered 2014-11-08: 50 mg via ORAL
  Filled 2014-11-08: qty 1

## 2014-11-08 MED ORDER — DOXYCYCLINE HYCLATE 100 MG PO CAPS: 100.0000 mg | ORAL_CAPSULE | Freq: Two times a day (BID) | ORAL | Status: DC

## 2014-11-08 MED ORDER — TRAMADOL HCL 50 MG PO TABS: 50.0000 mg | ORAL_TABLET | Freq: Four times a day (QID) | ORAL | Status: DC | PRN

## 2014-11-08 NOTE — ED Notes (Signed)
Pt states that she injured her back about a week ago . Pt was seen and treated at [facility]. Marland Kitchen Pt reports continued neck and back pain. Pt also reports a rash to upper rt arm with drainage.

## 2014-11-08 NOTE — Discharge Instructions (Signed)
Return to the emergency room with worsening of symptoms, new symptoms or with symptoms that are concerning, especially worsening, fevers, chills generally feeling ill, pass out. Please take all of your antibiotics until finished!   You may develop abdominal discomfort or diarrhea from the antibiotic.  You may help offset this with probiotics which you can buy or get in yogurt. Do not eat  or take the probiotics until 2 hours after your antibiotic.  Prednisone.  Follow up with PCP/urgent care/wellness center in one week. Benadryl for itch. Read below information and follow recommendations.  Contact Dermatitis Contact dermatitis is a reaction to certain substances that touch the skin. Contact dermatitis can be either irritant contact dermatitis or allergic contact dermatitis. Irritant contact dermatitis does not require previous exposure to the substance for a reaction to occur.Allergic contact dermatitis only occurs if you have been exposed to the substance before. Upon a repeat exposure, your body reacts to the substance.  CAUSES  Many substances can cause contact dermatitis. Irritant dermatitis is most commonly caused by repeated exposure to mildly irritating substances, such as:  Makeup.  Soaps.  Detergents.  Bleaches.  Acids.  Metal salts, such as nickel. Allergic contact dermatitis is most commonly caused by exposure to:  Poisonous plants.  Chemicals (deodorants, shampoos).  Jewelry.  Latex.  Neomycin in triple antibiotic cream.  Preservatives in products, including clothing. SYMPTOMS  The area of skin that is exposed may develop:  Dryness or flaking.  Redness.  Cracks.  Itching.  Pain or a burning sensation.  Blisters. With allergic contact dermatitis, there may also be swelling in areas such as the eyelids, mouth, or genitals.  DIAGNOSIS  Your caregiver can usually tell what the problem is by doing a physical exam. In cases where the cause is uncertain and  an allergic contact dermatitis is suspected, a patch skin test may be performed to help determine the cause of your dermatitis. TREATMENT Treatment includes protecting the skin from further contact with the irritating substance by avoiding that substance if possible. Barrier creams, powders, and gloves may be helpful. Your caregiver may also recommend:  Steroid creams or ointments applied 2 times daily. For best results, soak the rash area in cool water for 20 minutes. Then apply the medicine. Cover the area with a plastic wrap. You can store the steroid cream in the refrigerator for a "chilly" effect on your rash. That may decrease itching. Oral steroid medicines may be needed in more severe cases.  Antibiotics or antibacterial ointments if a skin infection is present.  Antihistamine lotion or an antihistamine taken by mouth to ease itching.  Lubricants to keep moisture in your skin.  Burow's solution to reduce redness and soreness or to dry a weeping rash. Mix one packet or tablet of solution in 2 cups cool water. Dip a clean washcloth in the mixture, wring it out a bit, and put it on the affected area. Leave the cloth in place for 30 minutes. Do this as often as possible throughout the day.  Taking several cornstarch or baking soda baths daily if the area is too large to cover with a washcloth. Harsh chemicals, such as alkalis or acids, can cause skin damage that is like a burn. You should flush your skin for 15 to 20 minutes with cold water after such an exposure. You should also seek immediate medical care after exposure. Bandages (dressings), antibiotics, and pain medicine may be needed for severely irritated skin.  HOME CARE INSTRUCTIONS  Avoid the  substance that caused your reaction.  Keep the area of skin that is affected away from hot water, soap, sunlight, chemicals, acidic substances, or anything else that would irritate your skin.  Do not scratch the rash. Scratching may cause the  rash to become infected.  You may take cool baths to help stop the itching.  Only take over-the-counter or prescription medicines as directed by your caregiver.  See your caregiver for follow-up care as directed to make sure your skin is healing properly. SEEK MEDICAL CARE IF:   Your condition is not better after 3 days of treatment.  You seem to be getting worse.  You see signs of infection such as swelling, tenderness, redness, soreness, or warmth in the affected area.  You have any problems related to your medicines. Document Released: 05/19/2000 Document Revised: 08/14/2011 Document Reviewed: 10/25/2010 [facility] Patient Information 2015 [place], [place]. This information is not intended to replace advice given to you by your health care provider. Make sure you discuss any questions you have with your health care provider.

## 2014-11-08 NOTE — ED Provider Notes (Signed)
CSN: [id number]     Arrival date & time 11/08/14  1252 History  This chart was scribed for non-physician practitioner, Al Corpus, PA-C, working with Christ Kick, MD, by Stephania Fragmin, ED Scribe. This patient was seen in room TR10C/TR10C and the patient's care was started at 3:12 PM.    Chief Complaint  Patient presents with  . Back Pain  . Rash   The history is provided by the patient. No language interpreter was used.     HPI Comments: Felicia Moore is a 34 y.o. female who presents to the Emergency Department complaining of constant mid-back pain and some neck pain that has been gradually worsening since she stepped into a hole at work 1.5 weeks ago. She states she feels like she has "knots" in her neck. Patient works outside. Numbness tingling or weakness. No saddle anesthesia or bowel or bladder incontinence.  Patient also complains of a burning rash on the inside her right upper arm that she first noticed 1 week ago but began to acutely swell 2 days ago. Patient endorses some drainage that she noticed after the area was irritated, although she is unable to specify the color of the discharge. Patient was placed on edolac for her rash when she was first evaluated about 1 weeks ago, but she has had no relief. She also tried cleaning it with alcohol with no relief. She denies any itching. She denies any new soaps, detergents, shampoos, or new medications.  She denies any nausea or vomiting, but states that feels "blah" since it onset. She also denies trouble swallowing, chest tightness, or wheezing.   Patient has known allergies to Toradol. She is able to tolerate Tramadol. Patient has a ride home.    Past Medical History  Diagnosis Date  . Sciatica   . Hypertension   . Breast cancer     left lumpectomy 2006  . Migraines   . Breast CA   . Uterine cancer   . Stroke 2013   Past Surgical History  Procedure Laterality Date  . Abdominal hysterectomy    . Breast lumpectomy Left   .  Carpal tunnel release Right    No family history on file. History  Substance Use Topics  . Smoking status: Current Every Day Smoker -- 0.25 packs/day    Types: Cigarettes  . Smokeless tobacco: Not on file  . Alcohol Use: Yes     Comment: rare   OB History    No data available     Review of Systems  HENT: Negative for facial swelling and trouble swallowing.   Respiratory: Negative for chest tightness and wheezing.   Musculoskeletal: Positive for back pain. Negative for gait problem.  Skin: Positive for rash.    Allergies  Reglan; Toradol; and Zofran  Home Medications   Prior to Admission medications   Medication Sig Start Date End Date Taking? Authorizing Provider  acetaminophen (TYLENOL) 325 MG tablet Take 650 mg by mouth every 6 (six) hours as needed for headache.    Historical Provider, MD  albuterol (PROVENTIL HFA;VENTOLIN HFA) 108 (90 BASE) MCG/ACT inhaler Inhale 1-2 puffs into the lungs every 6 (six) hours as needed for wheezing or shortness of breath.    Historical Provider, MD  amoxicillin-clavulanate (AUGMENTIN) 875-125 MG per tablet Take 1 tablet by mouth 2 (two) times daily. One po bid x 7 days 10/15/14   Ripley Fraise, MD  carvedilol (COREG) 6.25 MG tablet Take 1 tablet (6.25 mg total) by mouth 2 (two) times daily  with a meal. 10/18/14   Billy Fischer, MD  doxycycline (VIBRAMYCIN) 100 MG capsule Take 1 capsule (100 mg total) by mouth 2 (two) times daily. 11/08/14   Al Corpus, PA-C  etodolac (LODINE) 400 MG tablet Take 1 tablet (400 mg total) by mouth 2 (two) times daily. 10/28/14   Gregor Hams, MD  lisinopril-hydrochlorothiazide (PRINZIDE,ZESTORETIC) 20-12.5 MG per tablet Take 1 tablet by mouth daily. 10/18/14   Billy Fischer, MD  loratadine (CLARITIN) 10 MG tablet Take 1 tablet (10 mg total) by mouth daily. 09/05/14   Clayton Bibles, PA-C  methocarbamol (ROBAXIN) 500 MG tablet Take 1 tablet (500 mg total) by mouth 2 (two) times daily. 10/31/14   Baron Sane, PA-C   predniSONE (DELTASONE) 20 MG tablet 2 tabs po daily x 5 days 11/08/14   Al Corpus, PA-C  promethazine (PHENERGAN) 25 MG tablet Take 1 tablet (25 mg total) by mouth every 6 (six) hours as needed for nausea or vomiting. 10/15/14   Ripley Fraise, MD  traMADol (ULTRAM) 50 MG tablet Take 1 tablet (50 mg total) by mouth every 6 (six) hours as needed. 11/08/14   Al Corpus, PA-C   BP 110/74 mmHg  Pulse 71  Temp(Src) 98.1 F (36.7 C) (Oral)  Resp 20  SpO2 98% Physical Exam  Constitutional: She appears well-developed and well-nourished. No distress.  HENT:  Head: Normocephalic and atraumatic.  Eyes: Conjunctivae and EOM are normal. Right eye exhibits no discharge. Left eye exhibits no discharge.  Cardiovascular: Normal rate and regular rhythm.   Pulmonary/Chest: Effort normal and breath sounds normal. No respiratory distress. She has no wheezes.  Abdominal: Soft. Bowel sounds are normal. She exhibits no distension. There is no tenderness.  Musculoskeletal: She exhibits tenderness.  Mid-back right paraspinal tenderness. Strength and sensation intact in bilateral upper extremities. 2+ radial pulses, equal bilaterally. DTR's equal and intact bilaterally. Negative straight leg raise. No cervical, lumbar, or thoracic tenderness.   Neurological: She is alert. She exhibits normal muscle tone. Coordination normal.  Skin: Skin is warm and dry. Rash noted. She is not diaphoretic.  Nursing note and vitals reviewed.      ED Course  Procedures (including critical care time)  DIAGNOSTIC STUDIES: Oxygen Saturation is 98% on RA, normal by my interpretation.    COORDINATION OF CARE: 3:15 PM - Discussed treatment plan with pt at bedside which includes Rx oral steroid medication, abx Tramadol in ED, and f/u with PCP at [facility], and pt agreed to plan.  3:27 PM - Attending physician Dr. Eulis Foster in room to evaluate patient.  Meds given in ED:  Medications  traMADol (ULTRAM) tablet 50 mg  (50 mg Oral Given 11/08/14 1531)    Discharge Medication List as of 11/08/2014  3:34 PM    START taking these medications   Details  doxycycline (VIBRAMYCIN) 100 MG capsule Take 1 capsule (100 mg total) by mouth 2 (two) times daily., Starting 11/08/2014, Until Discontinued, Print    predniSONE (DELTASONE) 20 MG tablet 2 tabs po daily x 5 days, Print          MDM   Final diagnoses:  Dermatitis   Pt presenting with rash seen above. It blanches. Likely infectious verses reactive. Will cover with abx and steroid and follow up in one week. Benadryl for ich. Tramadol for discomfort. Three pills provided. Driving and sedation precautions provided.  Discussed return precautions with patient. Discussed all results and patient verbalizes understanding and agrees with plan.  I personally performed the  services described in this documentation, which was scribed in my presence. The recorded information has been reviewed and is accurate.     Al Corpus, PA-C 11/08/14 [address], PA-C 11/08/14 [place], MD 11/08/14 [phone]

## 2014-11-08 NOTE — ED Notes (Signed)
Declined W/C at D/C and was escorted to lobby by RN. 

## 2014-11-09 ENCOUNTER — Encounter (HOSPITAL_COMMUNITY): Payer: Self-pay | Admitting: Emergency Medicine

## 2014-11-09 ENCOUNTER — Emergency Department (INDEPENDENT_AMBULATORY_CARE_PROVIDER_SITE_OTHER)
Admission: EM | Admit: 2014-11-09 | Discharge: 2014-11-09 | Disposition: A | Discharge status: Home / Self Care | Payer: Medicaid Other | Source: Home / Self Care | Attending: Family Medicine | Admitting: Family Medicine

## 2014-11-09 DIAGNOSIS — L03113 Cellulitis of right upper limb: Secondary | ICD-10-CM

## 2014-11-09 MED ORDER — CEFTRIAXONE SODIUM 1 G IJ SOLR
1.0000 g | Freq: Once | INTRAMUSCULAR | Status: AC
  Administered 2014-11-09: 1 g via INTRAMUSCULAR

## 2014-11-09 MED ORDER — TRAMADOL HCL 50 MG PO TABS: 50.0000 mg | ORAL_TABLET | Freq: Four times a day (QID) | ORAL | Status: DC | PRN

## 2014-11-09 MED ORDER — LIDOCAINE HCL (PF) 1 % IJ SOLN
INTRAMUSCULAR | Status: AC
  Filled 2014-11-09: qty 5

## 2014-11-09 MED ORDER — CEFTRIAXONE SODIUM 1 G IJ SOLR
INTRAMUSCULAR | Status: AC
  Filled 2014-11-09: qty 10

## 2014-11-09 MED ORDER — CEFUROXIME AXETIL 500 MG PO TABS: 500.0000 mg | ORAL_TABLET | Freq: Two times a day (BID) | ORAL | Status: DC

## 2014-11-09 NOTE — Discharge Instructions (Signed)
Thank you for coming in today. Start taking ceftin tomorrow morning.  Go to the ER if you worsen.   Cellulitis Cellulitis is an infection of the skin and the tissue beneath it. The infected area is usually red and tender. Cellulitis occurs most often in the arms and lower legs.  CAUSES  Cellulitis is caused by bacteria that enter the skin through cracks or cuts in the skin. The most common types of bacteria that cause cellulitis are staphylococci and streptococci. SIGNS AND SYMPTOMS   Redness and warmth.  Swelling.  Tenderness or pain.  Fever. DIAGNOSIS  Your health care provider can usually determine what is wrong based on a physical exam. Blood tests may also be done. TREATMENT  Treatment usually involves taking an antibiotic medicine. HOME CARE INSTRUCTIONS   Take your antibiotic medicine as directed by your health care provider. Finish the antibiotic even if you start to feel better.  Keep the infected arm or leg elevated to reduce swelling.  Apply a warm cloth to the affected area up to 4 times per day to relieve pain.  Take medicines only as directed by your health care provider.  Keep all follow-up visits as directed by your health care provider. SEEK MEDICAL CARE IF:   You notice red streaks coming from the infected area.  Your red area gets larger or turns dark in color.  Your bone or joint underneath the infected area becomes painful after the skin has healed.  Your infection returns in the same area or another area.  You notice a swollen bump in the infected area.  You develop new symptoms.  You have a fever. SEEK IMMEDIATE MEDICAL CARE IF:   You feel very sleepy.  You develop vomiting or diarrhea.  You have a general ill feeling (malaise) with muscle aches and pains. MAKE SURE YOU:   Understand these instructions.  Will watch your condition.  Will get help right away if you are not doing well or get worse. Document Released: 03/01/2005 Document  Revised: 10/06/2013 Document Reviewed: 08/07/2011 [facility] Patient Information 2015 [place], [place]. This information is not intended to replace advice given to you by your health care provider. Make sure you discuss any questions you have with your health care provider.

## 2014-11-09 NOTE — ED Provider Notes (Signed)
Felicia Moore is a 34 y.o. female who presents to Urgent Care today for right arm skin lesion. Patient has a crusted lesion on her right upper arm that's been present for 8 days. It became painful recently. She was seen in the emergency room yesterday he was treated with oral doxycycline and prednisone. Her pain has worsened. She denies any fevers or chills nausea vomiting or diarrhea. She feels well otherwise.   Past Medical History  Diagnosis Date  . Sciatica   . Hypertension   . Breast cancer     left lumpectomy 2006  . Migraines   . Breast CA   . Uterine cancer   . Stroke 2013   Past Surgical History  Procedure Laterality Date  . Abdominal hysterectomy    . Breast lumpectomy Left   . Carpal tunnel release Right    History  Substance Use Topics  . Smoking status: Current Every Day Smoker -- 0.25 packs/day    Types: Cigarettes  . Smokeless tobacco: Not on file  . Alcohol Use: Yes     Comment: rare   ROS as above Medications: Current Facility-Administered Medications  Medication Dose Route Frequency Provider Last Rate Last Dose  . cefTRIAXone (ROCEPHIN) injection 1 g  1 g Intramuscular Once Gregor Hams, MD       Current Outpatient Prescriptions  Medication Sig Dispense Refill  . doxycycline (VIBRAMYCIN) 100 MG capsule Take 1 capsule (100 mg total) by mouth 2 (two) times daily. 20 capsule 0  . predniSONE (DELTASONE) 20 MG tablet 2 tabs po daily x 5 days 10 tablet 0  . acetaminophen (TYLENOL) 325 MG tablet Take 650 mg by mouth every 6 (six) hours as needed for headache.    . albuterol (PROVENTIL HFA;VENTOLIN HFA) 108 (90 BASE) MCG/ACT inhaler Inhale 1-2 puffs into the lungs every 6 (six) hours as needed for wheezing or shortness of breath.    Marland Kitchen amoxicillin-clavulanate (AUGMENTIN) 875-125 MG per tablet Take 1 tablet by mouth 2 (two) times daily. One po bid x 7 days 14 tablet 0  . carvedilol (COREG) 6.25 MG tablet Take 1 tablet (6.25 mg total) by mouth 2 (two) times daily  with a meal. 60 tablet 0  . etodolac (LODINE) 400 MG tablet Take 1 tablet (400 mg total) by mouth 2 (two) times daily. 30 tablet 0  . lisinopril-hydrochlorothiazide (PRINZIDE,ZESTORETIC) 20-12.5 MG per tablet Take 1 tablet by mouth daily. 30 tablet 0  . loratadine (CLARITIN) 10 MG tablet Take 1 tablet (10 mg total) by mouth daily. 30 tablet 0  . methocarbamol (ROBAXIN) 500 MG tablet Take 1 tablet (500 mg total) by mouth 2 (two) times daily. 20 tablet 0  . promethazine (PHENERGAN) 25 MG tablet Take 1 tablet (25 mg total) by mouth every 6 (six) hours as needed for nausea or vomiting. 30 tablet 0  . traMADol (ULTRAM) 50 MG tablet Take 1 tablet (50 mg total) by mouth every 6 (six) hours as needed. 3 tablet 0   Allergies  Allergen Reactions  . Reglan [Metoclopramide] Other (See Comments)    Unknown reaction  . Toradol [Ketorolac Tromethamine] Other (See Comments)    Unknown reaction  . Zofran [Ondansetron Hcl] Other (See Comments)    Unknown reaction     Exam:  BP 125/79 mmHg  Pulse 71  Temp(Src) 98.4 F (36.9 C) (Oral)  Resp 18  SpO2 100% Gen: Well NAD HEENT: EOMI,  MMM Lungs: Normal work of breathing. CTABL Heart: RRR no MRG Abd: NABS, Soft.  Nondistended, Nontender Exts: Brisk capillary refill, warm and well perfused.  Skin: Erythematous scaly area right upper arm. Tender to touch. No crepitations palpate above. Normal are motion. Lesion looks similar to the picture from yesterday.  No results found for this or any previous visit (from the past 24 hour(s)). No results found.  Assessment and Plan: 34 y.o. female with arm lesion likely cellulitis. Treat with Ceftin in addition to doxycycline and prednisone. Additionally I have dispensed a 1 g IM ceftriaxone injection prior to discharge. If not better she will present to ED tomorrow.   Discussed warning signs or symptoms. Please see discharge instructions. Patient expresses understanding.     Gregor Hams, MD 11/09/14 [phone]

## 2014-11-09 NOTE — ED Notes (Signed)
Pt c/o poss insect bite to left arm since last Sunday; seen at [facility] ER yest and dx w/Dermatitis Given Clindamycin and prednisone w/no relief Reports it started as a small bump and progressively got bigger and more painful Sx today include fevers, redness Alert, no signs of acute distress.

## 2014-11-16 ENCOUNTER — Emergency Department (HOSPITAL_COMMUNITY)
Admission: EM | Admit: 2014-11-16 | Discharge: 2014-11-16 | Disposition: A | Discharge status: Home / Self Care | Payer: Medicaid Other | Attending: Emergency Medicine | Admitting: Emergency Medicine

## 2014-11-16 ENCOUNTER — Encounter (HOSPITAL_COMMUNITY): Payer: Self-pay | Admitting: Family Medicine

## 2014-11-16 DIAGNOSIS — G43909 Migraine, unspecified, not intractable, without status migrainosus: Secondary | ICD-10-CM | POA: Diagnosis not present

## 2014-11-16 DIAGNOSIS — R1032 Left lower quadrant pain: Secondary | ICD-10-CM | POA: Insufficient documentation

## 2014-11-16 DIAGNOSIS — Z79899 Other long term (current) drug therapy: Secondary | ICD-10-CM | POA: Insufficient documentation

## 2014-11-16 DIAGNOSIS — Z8719 Personal history of other diseases of the digestive system: Secondary | ICD-10-CM | POA: Diagnosis not present

## 2014-11-16 DIAGNOSIS — Z8739 Personal history of other diseases of the musculoskeletal system and connective tissue: Secondary | ICD-10-CM | POA: Diagnosis not present

## 2014-11-16 DIAGNOSIS — I10 Essential (primary) hypertension: Secondary | ICD-10-CM | POA: Diagnosis not present

## 2014-11-16 DIAGNOSIS — Z8542 Personal history of malignant neoplasm of other parts of uterus: Secondary | ICD-10-CM | POA: Insufficient documentation

## 2014-11-16 DIAGNOSIS — R109 Unspecified abdominal pain: Secondary | ICD-10-CM | POA: Diagnosis present

## 2014-11-16 DIAGNOSIS — Z90710 Acquired absence of both cervix and uterus: Secondary | ICD-10-CM | POA: Insufficient documentation

## 2014-11-16 DIAGNOSIS — Z8673 Personal history of transient ischemic attack (TIA), and cerebral infarction without residual deficits: Secondary | ICD-10-CM | POA: Diagnosis not present

## 2014-11-16 DIAGNOSIS — Z853 Personal history of malignant neoplasm of breast: Secondary | ICD-10-CM | POA: Insufficient documentation

## 2014-11-16 DIAGNOSIS — Z720 Tobacco use: Secondary | ICD-10-CM | POA: Insufficient documentation

## 2014-11-16 LAB — CBC WITH DIFFERENTIAL/PLATELET
Basophils Absolute: 0 10*3/uL (ref 0.0–0.1)
Basophils Relative: 0 % (ref 0–1)
Eosinophils Absolute: 0.2 10*3/uL (ref 0.0–0.7)
Eosinophils Relative: 2 % (ref 0–5)
HCT: 38.4 % (ref 36.0–46.0)
Hemoglobin: 12.7 g/dL (ref 12.0–15.0)
Lymphocytes Relative: 36 % (ref 12–46)
Lymphs Abs: 2.7 10*3/uL (ref 0.7–4.0)
MCH: 33.3 pg (ref 26.0–34.0)
MCHC: 33.1 g/dL (ref 30.0–36.0)
MCV: 100.8 fL — ABNORMAL HIGH (ref 78.0–100.0)
Monocytes Absolute: 0.3 10*3/uL (ref 0.1–1.0)
Monocytes Relative: 4 % (ref 3–12)
Neutro Abs: 4.3 10*3/uL (ref 1.7–7.7)
Neutrophils Relative %: 58 % (ref 43–77)
Platelets: 329 10*3/uL (ref 150–400)
RBC: 3.81 MIL/uL — ABNORMAL LOW (ref 3.87–5.11)
RDW: 12.5 % (ref 11.5–15.5)
WBC: 7.6 10*3/uL (ref 4.0–10.5)

## 2014-11-16 LAB — COMPREHENSIVE METABOLIC PANEL
ALT: 13 U/L — ABNORMAL LOW (ref 14–54)
AST: 16 U/L (ref 15–41)
Albumin: 3.8 g/dL (ref 3.5–5.0)
Alkaline Phosphatase: 62 U/L (ref 38–126)
Anion gap: 11 (ref 5–15)
BUN: 22 mg/dL — ABNORMAL HIGH (ref 6–20)
CO2: 22 mmol/L (ref 22–32)
Calcium: 9 mg/dL (ref 8.9–10.3)
Chloride: 107 mmol/L (ref 101–111)
Creatinine, Ser: 1.08 mg/dL — ABNORMAL HIGH (ref 0.44–1.00)
GFR calc Af Amer: >60 mL/min (ref >60)
GFR calc non Af Amer: >60 mL/min (ref >60)
Glucose, Bld: 105 mg/dL — ABNORMAL HIGH (ref 65–99)
Potassium: 3.8 mmol/L (ref 3.5–5.1)
Sodium: 140 mmol/L (ref 135–145)
Total Bilirubin: 0.3 mg/dL (ref 0.3–1.2)
Total Protein: 6.2 g/dL — ABNORMAL LOW (ref 6.5–8.1)

## 2014-11-16 LAB — URINALYSIS, ROUTINE W REFLEX MICROSCOPIC
Bilirubin Urine: NEGATIVE
Glucose, UA: NEGATIVE mg/dL
Hgb urine dipstick: NEGATIVE
Ketones, ur: NEGATIVE mg/dL
Leukocytes, UA: NEGATIVE
Nitrite: NEGATIVE
Protein, ur: NEGATIVE mg/dL
Specific Gravity, Urine: 1.041 — ABNORMAL HIGH (ref 1.005–1.030)
Urobilinogen, UA: 0.2 mg/dL (ref 0.0–1.0)
pH: 6 (ref 5.0–8.0)

## 2014-11-16 LAB — LIPASE, BLOOD: Lipase: 24 U/L (ref 22–51)

## 2014-11-16 MED ORDER — METRONIDAZOLE 500 MG PO TABS
500.0000 mg | ORAL_TABLET | Freq: Once | ORAL | Status: AC
Start: 2014-11-16 — End: 2014-11-16
  Administered 2014-11-16: 500 mg via ORAL
  Filled 2014-11-16: qty 1

## 2014-11-16 MED ORDER — TRAMADOL HCL 50 MG PO TABS: 50.0000 mg | ORAL_TABLET | Freq: Four times a day (QID) | ORAL | Status: DC | PRN

## 2014-11-16 MED ORDER — HYDROMORPHONE HCL 1 MG/ML IJ SOLN
1.0000 mg | INTRAMUSCULAR | Status: DC | PRN
  Administered 2014-11-16: 1 mg via INTRAVENOUS
  Filled 2014-11-16: qty 1

## 2014-11-16 MED ORDER — SODIUM CHLORIDE 0.9 % IV BOLUS (SEPSIS)
1000.0000 mL | Freq: Once | INTRAVENOUS | Status: AC
  Administered 2014-11-16: 1000 mL via INTRAVENOUS

## 2014-11-16 MED ORDER — CIPROFLOXACIN HCL 500 MG PO TABS: 500.0000 mg | ORAL_TABLET | Freq: Two times a day (BID) | ORAL | Status: DC

## 2014-11-16 MED ORDER — CIPROFLOXACIN IN D5W 400 MG/200ML IV SOLN
400.0000 mg | Freq: Two times a day (BID) | INTRAVENOUS | Status: DC
  Administered 2014-11-16: 400 mg via INTRAVENOUS
  Filled 2014-11-16: qty 200

## 2014-11-16 MED ORDER — METRONIDAZOLE 500 MG PO TABS: 500.0000 mg | ORAL_TABLET | Freq: Three times a day (TID) | ORAL | Status: DC

## 2014-11-16 NOTE — ED Notes (Signed)
Pt here for left sided mostly lower abd pain with nausea. Denies vaginal or urinary symptoms.

## 2014-11-18 ENCOUNTER — Emergency Department (HOSPITAL_COMMUNITY)
Admission: EM | Admit: 2014-11-18 | Discharge: 2014-11-19 | Disposition: A | Discharge status: Home / Self Care | Payer: Medicaid Other | Attending: Emergency Medicine | Admitting: Emergency Medicine

## 2014-11-18 ENCOUNTER — Encounter (HOSPITAL_COMMUNITY): Payer: Self-pay

## 2014-11-18 DIAGNOSIS — K5732 Diverticulitis of large intestine without perforation or abscess without bleeding: Secondary | ICD-10-CM

## 2014-11-18 DIAGNOSIS — Z720 Tobacco use: Secondary | ICD-10-CM | POA: Diagnosis not present

## 2014-11-18 DIAGNOSIS — R109 Unspecified abdominal pain: Secondary | ICD-10-CM | POA: Diagnosis present

## 2014-11-18 DIAGNOSIS — I10 Essential (primary) hypertension: Secondary | ICD-10-CM | POA: Diagnosis not present

## 2014-11-18 DIAGNOSIS — Z8673 Personal history of transient ischemic attack (TIA), and cerebral infarction without residual deficits: Secondary | ICD-10-CM | POA: Insufficient documentation

## 2014-11-18 DIAGNOSIS — Z8739 Personal history of other diseases of the musculoskeletal system and connective tissue: Secondary | ICD-10-CM | POA: Diagnosis not present

## 2014-11-18 DIAGNOSIS — Z853 Personal history of malignant neoplasm of breast: Secondary | ICD-10-CM | POA: Diagnosis not present

## 2014-11-18 DIAGNOSIS — Z8542 Personal history of malignant neoplasm of other parts of uterus: Secondary | ICD-10-CM | POA: Insufficient documentation

## 2014-11-18 DIAGNOSIS — Z79899 Other long term (current) drug therapy: Secondary | ICD-10-CM | POA: Diagnosis not present

## 2014-11-18 HISTORY — DX: Diverticulitis of intestine, part unspecified, without perforation or abscess without bleeding: K57.92

## 2014-11-18 LAB — CBC WITH DIFFERENTIAL/PLATELET
BASOS ABS: 0 10*3/uL (ref 0.0–0.1)
Basophils Relative: 0 % (ref 0–1)
Eosinophils Absolute: 0.2 10*3/uL (ref 0.0–0.7)
Eosinophils Relative: 3 % (ref 0–5)
HEMATOCRIT: 36.4 % (ref 36.0–46.0)
HEMOGLOBIN: 12.1 g/dL (ref 12.0–15.0)
Lymphocytes Relative: 34 % (ref 12–46)
Lymphs Abs: 2.5 10*3/uL (ref 0.7–4.0)
MCH: 34.2 pg — ABNORMAL HIGH (ref 26.0–34.0)
MCHC: 33.2 g/dL (ref 30.0–36.0)
MCV: 102.8 fL — ABNORMAL HIGH (ref 78.0–100.0)
MONO ABS: 0.5 10*3/uL (ref 0.1–1.0)
Monocytes Relative: 7 % (ref 3–12)
NEUTROS ABS: 4.1 10*3/uL (ref 1.7–7.7)
NEUTROS PCT: 56 % (ref 43–77)
PLATELETS: 301 10*3/uL (ref 150–400)
RBC: 3.54 MIL/uL — ABNORMAL LOW (ref 3.87–5.11)
RDW: 12.6 % (ref 11.5–15.5)
WBC: 7.3 10*3/uL (ref 4.0–10.5)

## 2014-11-18 MED ORDER — CIPROFLOXACIN IN D5W 400 MG/200ML IV SOLN
400.0000 mg | Freq: Once | INTRAVENOUS | Status: AC
  Administered 2014-11-19: 400 mg via INTRAVENOUS
  Filled 2014-11-18: qty 200

## 2014-11-18 MED ORDER — SODIUM CHLORIDE 0.9 % IV BOLUS (SEPSIS)
1000.0000 mL | Freq: Once | INTRAVENOUS | Status: AC
  Administered 2014-11-19: 1000 mL via INTRAVENOUS

## 2014-11-18 MED ORDER — HYDROMORPHONE HCL 1 MG/ML IJ SOLN
0.5000 mg | Freq: Once | INTRAMUSCULAR | Status: AC
  Administered 2014-11-19: 0.5 mg via INTRAVENOUS
  Filled 2014-11-18: qty 1

## 2014-11-18 MED ORDER — PROMETHAZINE HCL 25 MG/ML IJ SOLN
12.5000 mg | Freq: Once | INTRAMUSCULAR | Status: AC
  Administered 2014-11-19: 12.5 mg via INTRAVENOUS
  Filled 2014-11-18: qty 1

## 2014-11-18 MED ORDER — METRONIDAZOLE IN NACL 5-0.79 MG/ML-% IV SOLN
500.0000 mg | Freq: Once | INTRAVENOUS | Status: AC
  Administered 2014-11-19: 500 mg via INTRAVENOUS
  Filled 2014-11-18: qty 100

## 2014-11-18 NOTE — ED Notes (Signed)
Patient complains of abdominal pain and swelling, accompanied by N/V/D.  Patient reports history of diverticulitis.  Seen at [facility] ED for same Monday, but reports symptoms are worsening.

## 2014-11-19 LAB — COMPREHENSIVE METABOLIC PANEL
ALK PHOS: 63 U/L (ref 38–126)
ALT: 14 U/L (ref 14–54)
AST: 15 U/L (ref 15–41)
Albumin: 4 g/dL (ref 3.5–5.0)
Anion gap: 8 (ref 5–15)
BUN: 20 mg/dL (ref 6–20)
CALCIUM: 8.9 mg/dL (ref 8.9–10.3)
CO2: 23 mmol/L (ref 22–32)
Chloride: 107 mmol/L (ref 101–111)
Creatinine, Ser: 0.7 mg/dL (ref 0.44–1.00)
GFR calc Af Amer: >60 mL/min (ref >60)
GFR calc non Af Amer: >60 mL/min (ref >60)
Glucose, Bld: 95 mg/dL (ref 65–99)
POTASSIUM: 3.6 mmol/L (ref 3.5–5.1)
Sodium: 138 mmol/L (ref 135–145)
TOTAL PROTEIN: 6.5 g/dL (ref 6.5–8.1)
Total Bilirubin: 0.4 mg/dL (ref 0.3–1.2)

## 2014-11-19 LAB — LIPASE, BLOOD: Lipase: 21 U/L — ABNORMAL LOW (ref 22–51)

## 2014-11-19 MED ORDER — HYDROCODONE-ACETAMINOPHEN 5-325 MG PO TABS: 1.0000 | ORAL_TABLET | Freq: Four times a day (QID) | ORAL | Status: DC | PRN

## 2014-11-19 MED ORDER — PROMETHAZINE HCL 12.5 MG PO TABS: 12.5000 mg | ORAL_TABLET | Freq: Four times a day (QID) | ORAL | Status: DC | PRN

## 2014-11-19 NOTE — ED Provider Notes (Signed)
CSN: [id number]     Arrival date & time 11/18/14  1900 History   First MD Initiated Contact with Patient 11/18/14 2247     Chief Complaint  Patient presents with  . Diverticulitis     (Consider location/radiation/quality/duration/timing/severity/associated sxs/prior Treatment) HPI Felicia Moore is a 34 y.o. female with history of CVA, hypertension, migraines, diverticulitis, presents to emergency department complaining of abdominal pain. Pain is mainly in the left abdomen. States she was seen 2 days ago and diagnosed with diverticulitis. She was discharged home with antibiotics and was given tramadol for pain. She states that her pain has been getting worse since then. She now has nausea and vomiting. She denies any fever or chills. She states she has started having diarrhea which is also new. She has been taking both Cipro and Flagyl as prescribed. She has no other complaints.    Past Medical History  Diagnosis Date  . Sciatica   . Hypertension   . Breast cancer     left lumpectomy 2006  . Migraines   . Breast CA   . Uterine cancer   . Stroke 2013  . Diverticulitis    Past Surgical History  Procedure Laterality Date  . Abdominal hysterectomy    . Breast lumpectomy Left   . Carpal tunnel release Right    History reviewed. No pertinent family history. History  Substance Use Topics  . Smoking status: Current Every Day Smoker -- 0.25 packs/day    Types: Cigarettes  . Smokeless tobacco: Not on file  . Alcohol Use: Yes     Comment: rare   OB History    No data available     Review of Systems  Constitutional: Negative for fever and chills.  Respiratory: Negative for cough, chest tightness and shortness of breath.   Cardiovascular: Negative for chest pain, palpitations and leg swelling.  Gastrointestinal: Positive for nausea, vomiting and abdominal pain. Negative for diarrhea.  Genitourinary: Negative for dysuria, flank pain and pelvic pain.  Musculoskeletal: Negative  for myalgias, arthralgias, neck pain and neck stiffness.  Skin: Negative for rash.  Neurological: Negative for dizziness, weakness and headaches.  All other systems reviewed and are negative.     Allergies  Reglan; Toradol; and Zofran  Home Medications   Prior to Admission medications   Medication Sig Start Date End Date Taking? Authorizing Provider  albuterol (PROVENTIL HFA;VENTOLIN HFA) 108 (90 BASE) MCG/ACT inhaler Inhale 1-2 puffs into the lungs every 6 (six) hours as needed for wheezing or shortness of breath.   Yes Historical Provider, MD  Aspirin-Acetaminophen-Caffeine (EXCEDRIN PO) Take 2 tablets by mouth daily as needed (general pain).   Yes Historical Provider, MD  carvedilol (COREG) 6.25 MG tablet Take 1 tablet (6.25 mg total) by mouth 2 (two) times daily with a meal. 10/18/14  Yes Billy Fischer, MD  ciprofloxacin (CIPRO) 500 MG tablet Take 1 tablet (500 mg total) by mouth every 12 (twelve) hours. 11/16/14  Yes Virgel Manifold, MD  lisinopril-hydrochlorothiazide (PRINZIDE,ZESTORETIC) 20-12.5 MG per tablet Take 1 tablet by mouth daily. 10/18/14  Yes Billy Fischer, MD  loratadine (CLARITIN) 10 MG tablet Take 1 tablet (10 mg total) by mouth daily. 09/05/14  Yes Clayton Bibles, PA-C  metroNIDAZOLE (FLAGYL) 500 MG tablet Take 1 tablet (500 mg total) by mouth 3 (three) times daily. 11/16/14  Yes Virgel Manifold, MD  traMADol (ULTRAM) 50 MG tablet Take 1 tablet (50 mg total) by mouth every 6 (six) hours as needed. 11/16/14  Yes Virgel Manifold, MD  BP 145/81 mmHg  Pulse 69  Temp(Src) 98.3 F (36.8 C) (Oral)  Resp 16  SpO2 100% Physical Exam  Constitutional: She is oriented to person, place, and time. She appears well-developed and well-nourished. No distress.  HENT:  Head: Normocephalic.  Eyes: Conjunctivae are normal.  Neck: Neck supple.  Cardiovascular: Normal rate, regular rhythm and normal heart sounds.   Pulmonary/Chest: Effort normal and breath sounds normal. No respiratory distress.  She has no wheezes. She has no rales.  Abdominal: Soft. Bowel sounds are normal. She exhibits no distension. There is tenderness. There is no rebound.  RUQ, LUQ, LLQ tenderness. No guarding or rebound tenderness  Musculoskeletal: She exhibits no edema.  Neurological: She is alert and oriented to person, place, and time.  Skin: Skin is warm and dry.  Psychiatric: She has a normal mood and affect. Her behavior is normal.  Nursing note and vitals reviewed.   ED Course  Procedures (including critical care time) Labs Review Labs Reviewed  CBC WITH DIFFERENTIAL/PLATELET - Abnormal; Notable for the following:    RBC 3.54 (*)    MCV 102.8 (*)    MCH 34.2 (*)    All other components within normal limits  LIPASE, BLOOD - Abnormal; Notable for the following:    Lipase 21 (*)    All other components within normal limits  COMPREHENSIVE METABOLIC PANEL    Imaging Review No results found.   EKG Interpretation None      MDM   Final diagnoses:  Diverticulitis of large intestine without perforation or abscess without bleeding    Pt diagnosed with diverticulitis 2 days ago. Here with worsening pain, nausea, vomiting, diarrhea. No fever. His abdomen is tender but no guarding or rebound tenderness. I do not think she has perforation or abscess based on examination. Abdomen is soft. She is afebrile. Vital signs are normal. Will give IV fluids, repeat lab work, give dose of intravenous antibiotics.  1:11 AM Patient is feeling better, she does not want be admitted to the hospital. Labs unremarkable. Bowel sounds are normal. Patient is asking for pain medications and anti-medics at home to help with her symptoms. I will discharge her with Norco and Phenergan. Instructed to return of her symptoms are again worsening.  Filed Vitals:   11/18/14 1907 11/19/14 0006 11/19/14 0237  BP: 146/84 145/81 143/84  Pulse: 100 69 59  Temp: 98.3 F (36.8 C)    TempSrc: Oral    Resp: 19 16 16   SpO2: 99%  100% 100%     [name], PA-C 11/19/14 2011  Orlie Dakin, MD 11/20/14 2094

## 2014-11-19 NOTE — Discharge Instructions (Signed)
Phenergan as prescribed as needed for nausea. norco for pain. Continue to take all of your antibiotics. Follow up with gastroenterologist. Return if worsening symptoms.    Diverticulitis Diverticulitis is inflammation or infection of small pouches in your colon that form when you have a condition called diverticulosis. The pouches in your colon are called diverticula. Your colon, or large intestine, is where water is absorbed and stool is formed. Complications of diverticulitis can include:  Bleeding.  Severe infection.  Severe pain.  Perforation of your colon.  Obstruction of your colon. CAUSES  Diverticulitis is caused by bacteria. Diverticulitis happens when stool becomes trapped in diverticula. This allows bacteria to grow in the diverticula, which can lead to inflammation and infection. RISK FACTORS People with diverticulosis are at risk for diverticulitis. Eating a diet that does not include enough fiber from fruits and vegetables may make diverticulitis more likely to develop. SYMPTOMS  Symptoms of diverticulitis may include:  Abdominal pain and tenderness. The pain is normally located on the left side of the abdomen, but may occur in other areas.  Fever and chills.  Bloating.  Cramping.  Nausea.  Vomiting.  Constipation.  Diarrhea.  Blood in your stool. DIAGNOSIS  Your health care provider will ask you about your medical history and do a physical exam. You may need to have tests done because many medical conditions can cause the same symptoms as diverticulitis. Tests may include:  Blood tests.  Urine tests.  Imaging tests of the abdomen, including X-rays and CT scans. When your condition is under control, your health care provider may recommend that you have a colonoscopy. A colonoscopy can show how severe your diverticula are and whether something else is causing your symptoms. TREATMENT  Most cases of diverticulitis are mild and can be treated at home.  Treatment may include:  Taking over-the-counter pain medicines.  Following a clear liquid diet.  Taking antibiotic medicines by mouth for 7-10 days. More severe cases may be treated at a hospital. Treatment may include:  Not eating or drinking.  Taking prescription pain medicine.  Receiving antibiotic medicines through an IV tube.  Receiving fluids and nutrition through an IV tube.  Surgery. HOME CARE INSTRUCTIONS   Follow your health care provider's instructions carefully.  Follow a full liquid diet or other diet as directed by your health care provider. After your symptoms improve, your health care provider may tell you to change your diet. He or she may recommend you eat a high-fiber diet. Fruits and vegetables are good sources of fiber. Fiber makes it easier to pass stool.  Take fiber supplements or probiotics as directed by your health care provider.  Only take medicines as directed by your health care provider.  Keep all your follow-up appointments. SEEK MEDICAL CARE IF:   Your pain does not improve.  You have a hard time eating food.  Your bowel movements do not return to normal. SEEK IMMEDIATE MEDICAL CARE IF:   Your pain becomes worse.  Your symptoms do not get better.  Your symptoms suddenly get worse.  You have a fever.  You have repeated vomiting.  You have bloody or black, tarry stools. MAKE SURE YOU:   Understand these instructions.  Will watch your condition.  Will get help right away if you are not doing well or get worse. Document Released: 03/01/2005 Document Revised: 05/27/2013 Document Reviewed: 04/16/2013 [facility] Patient Information 2015 [place], [place]. This information is not intended to replace advice given to you by your health care  provider. Make sure you discuss any questions you have with your health care provider. ° °

## 2014-11-26 NOTE — ED Provider Notes (Signed)
CSN: [id number]     Arrival date & time 11/16/14  1824 History   First MD Initiated Contact with Patient 11/16/14 1928     Chief Complaint  Patient presents with  . Abdominal Pain     (Consider location/radiation/quality/duration/timing/severity/associated sxs/prior Treatment) HPI   34yF with abdominal pain. Gradual onset about a day ago. L sided. Persistent. Feels "deep" and achy. Hx of diverticulitis and reports feels similar to that. No change in stool. No urinary complaints. Nausea. No vomiting. No unusual vaginal bleeding or discharge.   Past Medical History  Diagnosis Date  . Sciatica   . Hypertension   . Breast cancer     left lumpectomy 2006  . Migraines   . Breast CA   . Uterine cancer   . Stroke 2013  . Diverticulitis    Past Surgical History  Procedure Laterality Date  . Abdominal hysterectomy    . Breast lumpectomy Left   . Carpal tunnel release Right    History reviewed. No pertinent family history. History  Substance Use Topics  . Smoking status: Current Every Day Smoker -- 0.25 packs/day    Types: Cigarettes  . Smokeless tobacco: Not on file  . Alcohol Use: Yes     Comment: rare   OB History    No data available     Review of Systems  All systems reviewed and negative, other than as noted in HPI.    Allergies  Reglan; Toradol; and Zofran  Home Medications   Prior to Admission medications   Medication Sig Start Date End Date Taking? Authorizing Provider  albuterol (PROVENTIL HFA;VENTOLIN HFA) 108 (90 BASE) MCG/ACT inhaler Inhale 1-2 puffs into the lungs every 6 (six) hours as needed for wheezing or shortness of breath.   Yes Historical Provider, MD  Aspirin-Acetaminophen-Caffeine (EXCEDRIN PO) Take 2 tablets by mouth daily as needed (general pain).   Yes Historical Provider, MD  carvedilol (COREG) 6.25 MG tablet Take 1 tablet (6.25 mg total) by mouth 2 (two) times daily with a meal. 10/18/14  Yes Billy Fischer, MD   lisinopril-hydrochlorothiazide (PRINZIDE,ZESTORETIC) 20-12.5 MG per tablet Take 1 tablet by mouth daily. 10/18/14  Yes Billy Fischer, MD  loratadine (CLARITIN) 10 MG tablet Take 1 tablet (10 mg total) by mouth daily. 09/05/14  Yes Clayton Bibles, PA-C  ciprofloxacin (CIPRO) 500 MG tablet Take 1 tablet (500 mg total) by mouth every 12 (twelve) hours. 11/16/14   Virgel Manifold, MD  HYDROcodone-acetaminophen (NORCO) 5-325 MG per tablet Take 1 tablet by mouth every 6 (six) hours as needed. 11/19/14   Tatyana Kirichenko, PA-C  metroNIDAZOLE (FLAGYL) 500 MG tablet Take 1 tablet (500 mg total) by mouth 3 (three) times daily. 11/16/14   Virgel Manifold, MD  promethazine (PHENERGAN) 12.5 MG tablet Take 1 tablet (12.5 mg total) by mouth every 6 (six) hours as needed for nausea or vomiting. 11/19/14   [name], PA-C  traMADol (ULTRAM) 50 MG tablet Take 1 tablet (50 mg total) by mouth every 6 (six) hours as needed. 11/16/14   Virgel Manifold, MD   BP 130/102 mmHg  Pulse 67  Temp(Src) 98.2 F (36.8 C)  Resp 18  Ht 5\' 4"  (1.626 m)  Wt 145 lb (65.772 kg)  BMI 24.88 kg/m2  SpO2 100% Physical Exam  Constitutional: She appears well-developed and well-nourished. No distress.  HENT:  Head: Normocephalic and atraumatic.  Eyes: Conjunctivae are normal. Right eye exhibits no discharge. Left eye exhibits no discharge.  Neck: Neck supple.  Cardiovascular:  Normal rate, regular rhythm and normal heart sounds.  Exam reveals no gallop and no friction rub.   No murmur heard. Pulmonary/Chest: Effort normal and breath sounds normal. No respiratory distress.  Abdominal: Soft. She exhibits no distension. There is tenderness.  Left mid and left lower quadrant tenderness. No rebound or guarding. No distention.  Genitourinary:  No CVA tenderness  Musculoskeletal: She exhibits no edema or tenderness.  Neurological: She is alert.  Skin: Skin is warm and dry.  Psychiatric: She has a normal mood and affect. Her behavior is  normal. Thought content normal.  Nursing note and vitals reviewed.   ED Course  Procedures (including critical care time) Labs Review Labs Reviewed  CBC WITH DIFFERENTIAL/PLATELET - Abnormal; Notable for the following:    RBC 3.81 (*)    MCV 100.8 (*)    All other components within normal limits  COMPREHENSIVE METABOLIC PANEL - Abnormal; Notable for the following:    Glucose, Bld 105 (*)    BUN 22 (*)    Creatinine, Ser 1.08 (*)    Total Protein 6.2 (*)    ALT 13 (*)    All other components within normal limits  URINALYSIS, ROUTINE W REFLEX MICROSCOPIC (NOT AT [facility]) - Abnormal; Notable for the following:    Color, Urine AMBER (*)    Specific Gravity, Urine 1.041 (*)    All other components within normal limits  LIPASE, BLOOD    Imaging Review No results found.   EKG Interpretation None      MDM   Final diagnoses:  Left sided abdominal pain    34 year old female with left-sided abdominal pain. History diverticulitis and states that current symptoms feel similar. Mild left-sided tenderness on exam. No peritoneal signs. No urinary complaints. No CVA tenderness. Urinalysis is unremarkable. Plan presumptive treatment for likely diverticulitis. Imaging deferred. Return precautions discussed.    Virgel Manifold, MD 11/26/14 [id number]

## 2014-12-09 ENCOUNTER — Emergency Department (HOSPITAL_COMMUNITY): Payer: Medicaid Other

## 2014-12-09 ENCOUNTER — Encounter (HOSPITAL_COMMUNITY): Payer: Self-pay | Admitting: Emergency Medicine

## 2014-12-09 ENCOUNTER — Emergency Department (HOSPITAL_COMMUNITY)
Admission: EM | Admit: 2014-12-09 | Discharge: 2014-12-09 | Disposition: A | Discharge status: Home / Self Care | Payer: Medicaid Other | Attending: Emergency Medicine | Admitting: Emergency Medicine

## 2014-12-09 DIAGNOSIS — G43909 Migraine, unspecified, not intractable, without status migrainosus: Secondary | ICD-10-CM | POA: Diagnosis not present

## 2014-12-09 DIAGNOSIS — Z8719 Personal history of other diseases of the digestive system: Secondary | ICD-10-CM | POA: Diagnosis not present

## 2014-12-09 DIAGNOSIS — Z8541 Personal history of malignant neoplasm of cervix uteri: Secondary | ICD-10-CM | POA: Insufficient documentation

## 2014-12-09 DIAGNOSIS — Z8673 Personal history of transient ischemic attack (TIA), and cerebral infarction without residual deficits: Secondary | ICD-10-CM | POA: Diagnosis not present

## 2014-12-09 DIAGNOSIS — I10 Essential (primary) hypertension: Secondary | ICD-10-CM | POA: Diagnosis not present

## 2014-12-09 DIAGNOSIS — Z792 Long term (current) use of antibiotics: Secondary | ICD-10-CM | POA: Insufficient documentation

## 2014-12-09 DIAGNOSIS — Z79899 Other long term (current) drug therapy: Secondary | ICD-10-CM | POA: Diagnosis not present

## 2014-12-09 DIAGNOSIS — R509 Fever, unspecified: Secondary | ICD-10-CM | POA: Diagnosis not present

## 2014-12-09 DIAGNOSIS — R112 Nausea with vomiting, unspecified: Secondary | ICD-10-CM | POA: Insufficient documentation

## 2014-12-09 DIAGNOSIS — Z853 Personal history of malignant neoplasm of breast: Secondary | ICD-10-CM | POA: Diagnosis not present

## 2014-12-09 DIAGNOSIS — R1032 Left lower quadrant pain: Secondary | ICD-10-CM | POA: Insufficient documentation

## 2014-12-09 DIAGNOSIS — Z720 Tobacco use: Secondary | ICD-10-CM | POA: Diagnosis not present

## 2014-12-09 DIAGNOSIS — R1012 Left upper quadrant pain: Secondary | ICD-10-CM | POA: Diagnosis not present

## 2014-12-09 LAB — URINALYSIS, ROUTINE W REFLEX MICROSCOPIC
BILIRUBIN URINE: NEGATIVE
GLUCOSE, UA: NEGATIVE mg/dL
Hgb urine dipstick: NEGATIVE
KETONES UR: NEGATIVE mg/dL
LEUKOCYTES UA: NEGATIVE
Nitrite: NEGATIVE
Protein, ur: NEGATIVE mg/dL
Specific Gravity, Urine: 1.006 (ref 1.005–1.030)
Urobilinogen, UA: 0.2 mg/dL (ref 0.0–1.0)
pH: 7.5 (ref 5.0–8.0)

## 2014-12-09 LAB — CBC WITH DIFFERENTIAL/PLATELET
BASOS PCT: 0 % (ref 0–1)
Basophils Absolute: 0 10*3/uL (ref 0.0–0.1)
EOS ABS: 0.1 10*3/uL (ref 0.0–0.7)
Eosinophils Relative: 1 % (ref 0–5)
HCT: 41 % (ref 36.0–46.0)
Hemoglobin: 13.6 g/dL (ref 12.0–15.0)
LYMPHS ABS: 1.2 10*3/uL (ref 0.7–4.0)
Lymphocytes Relative: 18 % (ref 12–46)
MCH: 33.9 pg (ref 26.0–34.0)
MCHC: 33.2 g/dL (ref 30.0–36.0)
MCV: 102.2 fL — ABNORMAL HIGH (ref 78.0–100.0)
Monocytes Absolute: 0.3 10*3/uL (ref 0.1–1.0)
Monocytes Relative: 5 % (ref 3–12)
Neutro Abs: 4.8 10*3/uL (ref 1.7–7.7)
Neutrophils Relative %: 76 % (ref 43–77)
Platelets: 329 10*3/uL (ref 150–400)
RBC: 4.01 MIL/uL (ref 3.87–5.11)
RDW: 12.5 % (ref 11.5–15.5)
WBC: 6.4 10*3/uL (ref 4.0–10.5)

## 2014-12-09 LAB — COMPREHENSIVE METABOLIC PANEL
ALBUMIN: 3.9 g/dL (ref 3.5–5.0)
ALK PHOS: 66 U/L (ref 38–126)
ALT: 13 U/L — AB (ref 14–54)
AST: 20 U/L (ref 15–41)
Anion gap: 8 (ref 5–15)
BUN: 10 mg/dL (ref 6–20)
CALCIUM: 9.1 mg/dL (ref 8.9–10.3)
CO2: 24 mmol/L (ref 22–32)
Chloride: 106 mmol/L (ref 101–111)
Creatinine, Ser: 0.74 mg/dL (ref 0.44–1.00)
GFR calc Af Amer: >60 mL/min (ref >60)
GFR calc non Af Amer: >60 mL/min (ref >60)
Glucose, Bld: 114 mg/dL — ABNORMAL HIGH (ref 65–99)
POTASSIUM: 4.2 mmol/L (ref 3.5–5.1)
SODIUM: 138 mmol/L (ref 135–145)
TOTAL PROTEIN: 6.5 g/dL (ref 6.5–8.1)
Total Bilirubin: 0.6 mg/dL (ref 0.3–1.2)

## 2014-12-09 LAB — LIPASE, BLOOD: LIPASE: 20 U/L — AB (ref 22–51)

## 2014-12-09 MED ORDER — MORPHINE SULFATE 4 MG/ML IJ SOLN
4.0000 mg | Freq: Once | INTRAMUSCULAR | Status: AC
  Administered 2014-12-09: 4 mg via INTRAVENOUS
  Filled 2014-12-09: qty 1

## 2014-12-09 MED ORDER — PROMETHAZINE HCL 25 MG/ML IJ SOLN
12.5000 mg | Freq: Once | INTRAMUSCULAR | Status: AC
  Administered 2014-12-09: 12.5 mg via INTRAVENOUS
  Filled 2014-12-09: qty 1

## 2014-12-09 MED ORDER — IOHEXOL 300 MG/ML  SOLN
100.0000 mL | Freq: Once | INTRAMUSCULAR | Status: AC | PRN
  Administered 2014-12-09: 100 mL via INTRAVENOUS

## 2014-12-09 MED ORDER — SODIUM CHLORIDE 0.9 % IV BOLUS (SEPSIS)
1000.0000 mL | Freq: Once | INTRAVENOUS | Status: AC
  Administered 2014-12-09: 1000 mL via INTRAVENOUS

## 2014-12-09 MED ORDER — IOHEXOL 300 MG/ML  SOLN
25.0000 mL | Freq: Once | INTRAMUSCULAR | Status: AC | PRN
  Administered 2014-12-09: 25 mL via ORAL

## 2014-12-09 NOTE — Discharge Instructions (Signed)
Ibuprofen 600 mg every 6 hours as needed for pain.  Follow-up with gastroenterology. Continue your efforts to make this appointment happen   Abdominal Pain, Women Abdominal (stomach, pelvic, or belly) pain can be caused by many things. It is important to tell your doctor:  The location of the pain.  Does it come and go or is it present all the time?  Are there things that start the pain (eating certain foods, exercise)?  Are there other symptoms associated with the pain (fever, nausea, vomiting, diarrhea)? All of this is helpful to know when trying to find the cause of the pain. CAUSES   Stomach: virus or bacteria infection, or ulcer.  Intestine: appendicitis (inflamed appendix), regional ileitis (Crohn's disease), ulcerative colitis (inflamed colon), irritable bowel syndrome, diverticulitis (inflamed diverticulum of the colon), or cancer of the stomach or intestine.  Gallbladder disease or stones in the gallbladder.  Kidney disease, kidney stones, or infection.  Pancreas infection or cancer.  Fibromyalgia (pain disorder).  Diseases of the female organs:  Uterus: fibroid (non-cancerous) tumors or infection.  Fallopian tubes: infection or tubal pregnancy.  Ovary: cysts or tumors.  Pelvic adhesions (scar tissue).  Endometriosis (uterus lining tissue growing in the pelvis and on the pelvic organs).  Pelvic congestion syndrome (female organs filling up with blood just before the menstrual period).  Pain with the menstrual period.  Pain with ovulation (producing an egg).  Pain with an IUD (intrauterine device, birth control) in the uterus.  Cancer of the female organs.  Functional pain (pain not caused by a disease, may improve without treatment).  Psychological pain.  Depression. DIAGNOSIS  Your doctor will decide the seriousness of your pain by doing an examination.  Blood tests.  X-rays.  Ultrasound.  CT scan (computed tomography, special type of  X-ray).  MRI (magnetic resonance imaging).  Cultures, for infection.  Barium enema (dye inserted in the large intestine, to better view it with X-rays).  Colonoscopy (looking in intestine with a lighted tube).  Laparoscopy (minor surgery, looking in abdomen with a lighted tube).  Major abdominal exploratory surgery (looking in abdomen with a large incision). TREATMENT  The treatment will depend on the cause of the pain.   Many cases can be observed and treated at home.  Over-the-counter medicines recommended by your caregiver.  Prescription medicine.  Antibiotics, for infection.  Birth control pills, for painful periods or for ovulation pain.  Hormone treatment, for endometriosis.  Nerve blocking injections.  Physical therapy.  Antidepressants.  Counseling with a psychologist or psychiatrist.  Minor or major surgery. HOME CARE INSTRUCTIONS   Do not take laxatives, unless directed by your caregiver.  Take over-the-counter pain medicine only if ordered by your caregiver. Do not take aspirin because it can cause an upset stomach or bleeding.  Try a clear liquid diet (broth or water) as ordered by your caregiver. Slowly move to a bland diet, as tolerated, if the pain is related to the stomach or intestine.  Have a thermometer and take your temperature several times a day, and record it.  Bed rest and sleep, if it helps the pain.  Avoid sexual intercourse, if it causes pain.  Avoid stressful situations.  Keep your follow-up appointments and tests, as your caregiver orders.  If the pain does not go away with medicine or surgery, you may try:  Acupuncture.  Relaxation exercises (yoga, meditation).  Group therapy.  Counseling. SEEK MEDICAL CARE IF:   You notice certain foods cause stomach pain.  Your home  care treatment is not helping your pain.  You need stronger pain medicine.  You want your IUD removed.  You feel faint or lightheaded.  You  develop nausea and vomiting.  You develop a rash.  You are having side effects or an allergy to your medicine. SEEK IMMEDIATE MEDICAL CARE IF:   Your pain does not go away or gets worse.  You have a fever.  Your pain is felt only in portions of the abdomen. The right side could possibly be appendicitis. The left lower portion of the abdomen could be colitis or diverticulitis.  You are passing blood in your stools (bright red or black tarry stools, with or without vomiting).  You have blood in your urine.  You develop chills, with or without a fever.  You pass out. MAKE SURE YOU:   Understand these instructions.  Will watch your condition.  Will get help right away if you are not doing well or get worse. Document Released: 03/19/2007 Document Revised: 10/06/2013 Document Reviewed: 04/08/2009 [facility] Patient Information 2015 [place], [place]. This information is not intended to replace advice given to you by your health care provider. Make sure you discuss any questions you have with your health care provider.

## 2014-12-09 NOTE — ED Provider Notes (Signed)
CSN: [id number]     Arrival date & time 12/09/14  0816 History   First MD Initiated Contact with Patient 12/09/14 [phone]     Chief Complaint  Patient presents with  . Abdominal Pain     (Consider location/radiation/quality/duration/timing/severity/associated sxs/prior Treatment) HPI Comments: Patient is a 34 year old female with history of diverticulitis. This was diagnosed several months ago and she has been having intermittent flareups of it since. She was put on Cipro and Flagyl approximately 2 weeks ago, seemed to improve, but now her symptoms are again worsening. She reports to me she has felt fever but denies chills.  Patient is a 34 y.o. female presenting with abdominal pain. The history is provided by the patient.  Abdominal Pain Pain location:  LUQ and LLQ Pain quality: cramping   Pain radiates to:  Does not radiate Pain severity:  Moderate Onset quality:  Sudden Duration:  3 days Timing:  Constant Progression:  Worsening Chronicity:  Recurrent Relieved by:  Nothing Worsened by:  Nothing tried Ineffective treatments:  None tried Associated symptoms: chills, fever, nausea and vomiting   Associated symptoms: no diarrhea, no hematemesis and no hematochezia     Past Medical History  Diagnosis Date  . Sciatica   . Hypertension   . Breast cancer     left lumpectomy 2006  . Migraines   . Breast CA   . Uterine cancer   . Stroke 2013  . Diverticulitis    Past Surgical History  Procedure Laterality Date  . Abdominal hysterectomy    . Breast lumpectomy Left   . Carpal tunnel release Right    No family history on file. History  Substance Use Topics  . Smoking status: Current Every Day Smoker -- 0.25 packs/day for 20 years    Types: Cigarettes  . Smokeless tobacco: Not on file  . Alcohol Use: Yes     Comment: rare   OB History    No data available     Review of Systems  Constitutional: Positive for fever and chills.  Gastrointestinal: Positive for nausea,  vomiting and abdominal pain. Negative for diarrhea, hematochezia and hematemesis.  All other systems reviewed and are negative.     Allergies  Reglan; Toradol; and Zofran  Home Medications   Prior to Admission medications   Medication Sig Start Date End Date Taking? Authorizing Provider  albuterol (PROVENTIL HFA;VENTOLIN HFA) 108 (90 BASE) MCG/ACT inhaler Inhale 1-2 puffs into the lungs every 6 (six) hours as needed for wheezing or shortness of breath.    Historical Provider, MD  Aspirin-Acetaminophen-Caffeine (EXCEDRIN PO) Take 2 tablets by mouth daily as needed (general pain).    Historical Provider, MD  carvedilol (COREG) 6.25 MG tablet Take 1 tablet (6.25 mg total) by mouth 2 (two) times daily with a meal. 10/18/14   Billy Fischer, MD  ciprofloxacin (CIPRO) 500 MG tablet Take 1 tablet (500 mg total) by mouth every 12 (twelve) hours. 11/16/14   Virgel Manifold, MD  HYDROcodone-acetaminophen (NORCO) 5-325 MG per tablet Take 1 tablet by mouth every 6 (six) hours as needed. 11/19/14   Tatyana Kirichenko, PA-C  lisinopril-hydrochlorothiazide (PRINZIDE,ZESTORETIC) 20-12.5 MG per tablet Take 1 tablet by mouth daily. 10/18/14   Billy Fischer, MD  loratadine (CLARITIN) 10 MG tablet Take 1 tablet (10 mg total) by mouth daily. 09/05/14   Clayton Bibles, PA-C  metroNIDAZOLE (FLAGYL) 500 MG tablet Take 1 tablet (500 mg total) by mouth 3 (three) times daily. 11/16/14   Virgel Manifold, MD  promethazine (  PHENERGAN) 12.5 MG tablet Take 1 tablet (12.5 mg total) by mouth every 6 (six) hours as needed for nausea or vomiting. 11/19/14   [name], PA-C  traMADol (ULTRAM) 50 MG tablet Take 1 tablet (50 mg total) by mouth every 6 (six) hours as needed. 11/16/14   Virgel Manifold, MD   BP 150/99 mmHg  Pulse 83  Temp(Src) 98.7 F (37.1 C) (Oral)  Resp 18  Ht 5\' 4"  (1.626 m)  Wt 150 lb (68.04 kg)  BMI 25.73 kg/m2  SpO2 100% Physical Exam  Constitutional: She is oriented to person, place, and time. She appears  well-developed and well-nourished. No distress.  HENT:  Head: Normocephalic and atraumatic.  Neck: Normal range of motion. Neck supple.  Cardiovascular: Normal rate and regular rhythm.  Exam reveals no gallop and no friction rub.   No murmur heard. Pulmonary/Chest: Effort normal and breath sounds normal. No respiratory distress. She has no wheezes.  Abdominal: Soft. Bowel sounds are normal. She exhibits no distension. There is tenderness. There is no rebound and no guarding.  There is tenderness to palpation in the left upper and lower quadrant.  Musculoskeletal: Normal range of motion.  Neurological: She is alert and oriented to person, place, and time.  Skin: Skin is warm and dry. She is not diaphoretic.  Nursing note and vitals reviewed.   ED Course  Procedures (including critical care time) Labs Review Labs Reviewed - No data to display  Imaging Review No results found.   EKG Interpretation None      MDM   Final diagnoses:  None    Patient presents with left-sided abdominal pain and believing she has diverticulitis. Workup reveals no fever, no white count, and CT scan which is negative for acute intra-abdominal process. I will recommend she take ibuprofen and follow-up with the GI doctor as has been previously recommended.    Veryl Speak, MD 12/09/14 1048

## 2014-12-09 NOTE — ED Notes (Signed)
Pt presents with abd pain, n/v, x 3 days, states she feels bloated, abd pain is diffuse and generalized, not localized to one quadrant.  She states she has not been tolerating liquids and has feelings that she is not emptying her bowels completely.  Last bm was this morning but she states it was only a little bit.  Was on cipro and flagyl for diverticulitis, finished those a week ago.  States she has been running a fever, afebrile during triage.  VSS, nad.

## 2014-12-09 NOTE — ED Notes (Signed)
Pt to ct 

## 2014-12-28 ENCOUNTER — Emergency Department (HOSPITAL_COMMUNITY): Payer: Medicaid Other

## 2014-12-28 ENCOUNTER — Encounter (HOSPITAL_COMMUNITY): Payer: Self-pay | Admitting: Emergency Medicine

## 2014-12-28 ENCOUNTER — Emergency Department (HOSPITAL_COMMUNITY)
Admission: EM | Admit: 2014-12-28 | Discharge: 2014-12-28 | Disposition: A | Discharge status: Home / Self Care | Payer: Medicaid Other | Attending: Emergency Medicine | Admitting: Emergency Medicine

## 2014-12-28 DIAGNOSIS — Y9289 Other specified places as the place of occurrence of the external cause: Secondary | ICD-10-CM | POA: Diagnosis not present

## 2014-12-28 DIAGNOSIS — W208XXA Other cause of strike by thrown, projected or falling object, initial encounter: Secondary | ICD-10-CM | POA: Diagnosis not present

## 2014-12-28 DIAGNOSIS — Z720 Tobacco use: Secondary | ICD-10-CM | POA: Diagnosis not present

## 2014-12-28 DIAGNOSIS — Z853 Personal history of malignant neoplasm of breast: Secondary | ICD-10-CM | POA: Diagnosis not present

## 2014-12-28 DIAGNOSIS — Y9389 Activity, other specified: Secondary | ICD-10-CM | POA: Insufficient documentation

## 2014-12-28 DIAGNOSIS — Z79899 Other long term (current) drug therapy: Secondary | ICD-10-CM | POA: Insufficient documentation

## 2014-12-28 DIAGNOSIS — Z8541 Personal history of malignant neoplasm of cervix uteri: Secondary | ICD-10-CM | POA: Diagnosis not present

## 2014-12-28 DIAGNOSIS — S9031XA Contusion of right foot, initial encounter: Secondary | ICD-10-CM

## 2014-12-28 DIAGNOSIS — Z8673 Personal history of transient ischemic attack (TIA), and cerebral infarction without residual deficits: Secondary | ICD-10-CM | POA: Insufficient documentation

## 2014-12-28 DIAGNOSIS — I10 Essential (primary) hypertension: Secondary | ICD-10-CM | POA: Insufficient documentation

## 2014-12-28 DIAGNOSIS — R52 Pain, unspecified: Secondary | ICD-10-CM

## 2014-12-28 DIAGNOSIS — S99921A Unspecified injury of right foot, initial encounter: Secondary | ICD-10-CM | POA: Diagnosis present

## 2014-12-28 DIAGNOSIS — Y998 Other external cause status: Secondary | ICD-10-CM | POA: Diagnosis not present

## 2014-12-28 MED ORDER — OXYCODONE-ACETAMINOPHEN 5-325 MG PO TABS
1.0000 | ORAL_TABLET | Freq: Once | ORAL | Status: AC
  Administered 2014-12-28: 1 via ORAL
  Filled 2014-12-28: qty 1

## 2014-12-28 NOTE — Discharge Instructions (Signed)
Cryotherapy °Cryotherapy means treatment with cold. Ice or gel packs can be used to reduce both pain and swelling. Ice is the most helpful within the first 24 to 48 hours after an injury or flare-up from overusing a muscle or joint. Sprains, strains, spasms, burning pain, shooting pain, and aches can all be eased with ice. Ice can also be used when recovering from surgery. Ice is effective, has very few side effects, and is safe for most people to use. °PRECAUTIONS  °Ice is not a safe treatment option for people with: °· Raynaud phenomenon. This is a condition affecting small blood vessels in the extremities. Exposure to cold may cause your problems to return. °· Cold hypersensitivity. There are many forms of cold hypersensitivity, including: °¨ Cold urticaria. Red, itchy hives appear on the skin when the tissues begin to warm after being iced. °¨ Cold erythema. This is a red, itchy rash caused by exposure to cold. °¨ Cold hemoglobinuria. Red blood cells break down when the tissues begin to warm after being iced. The hemoglobin that carry oxygen are passed into the urine because they cannot combine with blood proteins fast enough. °· Numbness or altered sensitivity in the area being iced. °If you have any of the following conditions, do not use ice until you have discussed cryotherapy with your caregiver: °· Heart conditions, such as arrhythmia, angina, or chronic heart disease. °· High blood pressure. °· Healing wounds or open skin in the area being iced. °· Current infections. °· Rheumatoid arthritis. °· Poor circulation. °· Diabetes. °Ice slows the blood flow in the region it is applied. This is beneficial when trying to stop inflamed tissues from spreading irritating chemicals to surrounding tissues. However, if you expose your skin to cold temperatures for too long or without the proper protection, you can damage your skin or nerves. Watch for signs of skin damage due to cold. °HOME CARE INSTRUCTIONS °Follow  these tips to use ice and cold packs safely. °· Place a dry or damp towel between the ice and skin. A damp towel will cool the skin more quickly, so you may need to shorten the time that the ice is used. °· For a more rapid response, add gentle compression to the ice. °· Ice for no more than 10 to 20 minutes at a time. The bonier the area you are icing, the less time it will take to get the benefits of ice. °· Check your skin after 5 minutes to make sure there are no signs of a poor response to cold or skin damage. °· Rest 20 minutes or more between uses. °· Once your skin is numb, you can end your treatment. You can test numbness by very lightly touching your skin. The touch should be so light that you do not see the skin dimple from the pressure of your fingertip. When using ice, most people will feel these normal sensations in this order: cold, burning, aching, and numbness. °· Do not use ice on someone who cannot communicate their responses to pain, such as small children or people with dementia. °HOW TO MAKE AN ICE PACK °Ice packs are the most common way to use ice therapy. Other methods include ice massage, ice baths, and cryosprays. Muscle creams that cause a cold, tingly feeling do not offer the same benefits that ice offers and should not be used as a substitute unless recommended by your caregiver. °To make an ice pack, do one of the following: °· Place crushed ice or a   bag of frozen vegetables in a sealable plastic bag. Squeeze out the excess air. Place this bag inside another plastic bag. Slide the bag into a pillowcase or place a damp towel between your skin and the bag.  Mix 3 parts water with 1 part rubbing alcohol. Freeze the mixture in a sealable plastic bag. When you remove the mixture from the freezer, it will be slushy. Squeeze out the excess air. Place this bag inside another plastic bag. Slide the bag into a pillowcase or place a damp towel between your skin and the bag. SEEK MEDICAL CARE  IF:  You develop white spots on your skin. This may give the skin a blotchy (mottled) appearance.  Your skin turns blue or pale.  Your skin becomes waxy or hard.  Your swelling gets worse. MAKE SURE YOU:   Understand these instructions.  Will watch your condition.  Will get help right away if you are not doing well or get worse. Document Released: 01/16/2011 Document Revised: 10/06/2013 Document Reviewed: 01/16/2011 [facility] Patient Information 2015 [place], [place]. This information is not intended to replace advice given to you by your health care provider. Make sure you discuss any questions you have with your health care provider. Contusion A contusion is a deep bruise. Contusions are the result of an injury that caused bleeding under the skin. The contusion may turn blue, purple, or yellow. Minor injuries will give you a painless contusion, but more severe contusions may stay painful and swollen for a few weeks.  CAUSES  A contusion is usually caused by a blow, trauma, or direct force to an area of the body. SYMPTOMS   Swelling and redness of the injured area.  Bruising of the injured area.  Tenderness and soreness of the injured area.  Pain. DIAGNOSIS  The diagnosis can be made by taking a history and physical exam. An X-ray, CT scan, or MRI may be needed to determine if there were any associated injuries, such as fractures. TREATMENT  Specific treatment will depend on what area of the body was injured. In general, the best treatment for a contusion is resting, icing, elevating, and applying cold compresses to the injured area. Over-the-counter medicines may also be recommended for pain control. Ask your caregiver what the best treatment is for your contusion. HOME CARE INSTRUCTIONS   Put ice on the injured area.  Put ice in a plastic bag.  Place a towel between your skin and the bag.  Leave the ice on for 15-20 minutes, 3-4 times a day, or as directed by your health  care provider.  Only take over-the-counter or prescription medicines for pain, discomfort, or fever as directed by your caregiver. Your caregiver may recommend avoiding anti-inflammatory medicines (aspirin, ibuprofen, and naproxen) for 48 hours because these medicines may increase bruising.  Rest the injured area.  If possible, elevate the injured area to reduce swelling. SEEK IMMEDIATE MEDICAL CARE IF:   You have increased bruising or swelling.  You have pain that is getting worse.  Your swelling or pain is not relieved with medicines. MAKE SURE YOU:   Understand these instructions.  Will watch your condition.  Will get help right away if you are not doing well or get worse. Document Released: 03/01/2005 Document Revised: 05/27/2013 Document Reviewed: 03/27/2011 [facility] Patient Information 2015 [place], [place]. This information is not intended to replace advice given to you by your health care provider. Make sure you discuss any questions you have with your health care provider.

## 2014-12-28 NOTE — ED Notes (Signed)
[facility] presents with a 34 yo female that dropped a 42 inch older model TV on her right foot approximately 45 to 50 minutes ago..  Minimal bruising on top of right foot with no obvious deformities.  [facility] applied ice to the affected area.  Good pedal pulses and capillary refill with pain upon palpation.  7 out of 10 pain.

## 2014-12-28 NOTE — ED Provider Notes (Signed)
CSN: [id number]     Arrival date & time 12/28/14  0019 History   First MD Initiated Contact with Patient 12/28/14 0110     Chief Complaint  Patient presents with  . Foot Injury  . Foot Pain     (Consider location/radiation/quality/duration/timing/severity/associated sxs/prior Treatment) Patient is a 34 y.o. female presenting with foot injury and lower extremity pain. The history is provided by the patient. No language interpreter was used.  Foot Injury Location:  Foot Associated symptoms: no fever   Associated symptoms comment:  Right foot pain after dropping a TV on it earlier tonight. No other injury. Foot Pain Pertinent negatives include no chills or fever.    Past Medical History  Diagnosis Date  . Sciatica   . Hypertension   . Breast cancer     left lumpectomy 2006  . Migraines   . Breast CA   . Uterine cancer   . Stroke 2013  . Diverticulitis    Past Surgical History  Procedure Laterality Date  . Abdominal hysterectomy    . Breast lumpectomy Left   . Carpal tunnel release Right   . Cholecystectomy     History reviewed. No pertinent family history. History  Substance Use Topics  . Smoking status: Current Every Day Smoker -- 0.50 packs/day for 20 years    Types: Cigarettes  . Smokeless tobacco: Never Used  . Alcohol Use: Yes     Comment: rare   OB History    No data available     Review of Systems  Constitutional: Negative for fever and chills.  Musculoskeletal:       See HPI.  Skin: Positive for color change. Negative for wound.  Neurological: Negative.       Allergies  Reglan; Toradol; and Zofran  Home Medications   Prior to Admission medications   Medication Sig Start Date End Date Taking? Authorizing Provider  Aspirin-Acetaminophen-Caffeine (EXCEDRIN PO) Take 2 tablets by mouth daily as needed (general pain).   Yes Historical Provider, MD  carvedilol (COREG) 6.25 MG tablet Take 1 tablet (6.25 mg total) by mouth 2 (two) times daily with a  meal. 10/18/14  Yes Billy Fischer, MD  lisinopril-hydrochlorothiazide (PRINZIDE,ZESTORETIC) 20-12.5 MG per tablet Take 1 tablet by mouth daily. 10/18/14  Yes Billy Fischer, MD  loratadine (CLARITIN) 10 MG tablet Take 1 tablet (10 mg total) by mouth daily. 09/05/14  Yes Clayton Bibles, PA-C  albuterol (PROVENTIL HFA;VENTOLIN HFA) 108 (90 BASE) MCG/ACT inhaler Inhale 1-2 puffs into the lungs every 6 (six) hours as needed for wheezing or shortness of breath.    Historical Provider, MD  traMADol (ULTRAM) 50 MG tablet Take 1 tablet (50 mg total) by mouth every 6 (six) hours as needed. Patient not taking: Reported on 12/28/2014 11/16/14   Virgel Manifold, MD   BP 140/92 mmHg  Pulse 81  Temp(Src) 98.6 F (37 C) (Oral)  Resp 19  SpO2 97% Physical Exam  Constitutional: She is oriented to person, place, and time. She appears well-developed and well-nourished.  Neck: Normal range of motion.  Pulmonary/Chest: Effort normal.  Musculoskeletal:  Right foot without bony deformity. Area of bruising to medial forefoot without significant swelling. FROM toes distally.   Neurological: She is alert and oriented to person, place, and time.  Skin: Skin is warm and dry.    ED Course  Procedures (including critical care time) Labs Review Labs Reviewed - No data to display  Imaging Review Dg Foot Complete Right  12/28/2014  CLINICAL DATA:  Dorsal right foot pain after dropping TV on it 1 hr prior to presentation.  EXAM: RIGHT FOOT COMPLETE - 3+ VIEW  COMPARISON:  None.  FINDINGS: There is no evidence of fracture or dislocation. There is no evidence of arthropathy or other focal bone abnormality. Soft tissues are unremarkable.  IMPRESSION: Negative.   Electronically Signed   By: Andreas Newport M.D.   On: 12/28/2014 01:32     EKG Interpretation None      MDM   Final diagnoses:  Pain    1. Contusion foot, right  No fracture on imaging. Pain addressed in ED. Will provide crutches and post-op shoe for comfort  and discharge home recommending Tylenol for pain.    Charlann Lange, PA-C 12/28/14 0207  April Palumbo, MD 12/28/14 [phone]

## 2014-12-28 NOTE — ED Notes (Signed)
Bed: MI19 Expected date: 12/28/14 Expected time: 12:02 AM Means of arrival: Ambulance Comments: 34 yo  F  Right foot injury

## 2015-01-11 ENCOUNTER — Emergency Department (HOSPITAL_COMMUNITY)
Admission: EM | Admit: 2015-01-11 | Discharge: 2015-01-11 | Disposition: A | Discharge status: Home / Self Care | Payer: Medicaid Other | Attending: Emergency Medicine | Admitting: Emergency Medicine

## 2015-01-11 ENCOUNTER — Emergency Department (HOSPITAL_COMMUNITY): Payer: Medicaid Other

## 2015-01-11 ENCOUNTER — Encounter (HOSPITAL_COMMUNITY): Payer: Self-pay | Admitting: *Deleted

## 2015-01-11 DIAGNOSIS — Y9389 Activity, other specified: Secondary | ICD-10-CM | POA: Insufficient documentation

## 2015-01-11 DIAGNOSIS — Z720 Tobacco use: Secondary | ICD-10-CM | POA: Diagnosis not present

## 2015-01-11 DIAGNOSIS — Z8673 Personal history of transient ischemic attack (TIA), and cerebral infarction without residual deficits: Secondary | ICD-10-CM | POA: Insufficient documentation

## 2015-01-11 DIAGNOSIS — S9032XA Contusion of left foot, initial encounter: Secondary | ICD-10-CM | POA: Insufficient documentation

## 2015-01-11 DIAGNOSIS — S5011XA Contusion of right forearm, initial encounter: Secondary | ICD-10-CM | POA: Insufficient documentation

## 2015-01-11 DIAGNOSIS — W1839XA Other fall on same level, initial encounter: Secondary | ICD-10-CM | POA: Diagnosis not present

## 2015-01-11 DIAGNOSIS — Y9289 Other specified places as the place of occurrence of the external cause: Secondary | ICD-10-CM | POA: Insufficient documentation

## 2015-01-11 DIAGNOSIS — G43909 Migraine, unspecified, not intractable, without status migrainosus: Secondary | ICD-10-CM | POA: Diagnosis not present

## 2015-01-11 DIAGNOSIS — Y998 Other external cause status: Secondary | ICD-10-CM | POA: Insufficient documentation

## 2015-01-11 DIAGNOSIS — Z7982 Long term (current) use of aspirin: Secondary | ICD-10-CM | POA: Insufficient documentation

## 2015-01-11 DIAGNOSIS — Z853 Personal history of malignant neoplasm of breast: Secondary | ICD-10-CM | POA: Diagnosis not present

## 2015-01-11 DIAGNOSIS — Z7289 Other problems related to lifestyle: Secondary | ICD-10-CM | POA: Diagnosis not present

## 2015-01-11 DIAGNOSIS — Z765 Malingerer [conscious simulation]: Secondary | ICD-10-CM

## 2015-01-11 DIAGNOSIS — Z8542 Personal history of malignant neoplasm of other parts of uterus: Secondary | ICD-10-CM | POA: Insufficient documentation

## 2015-01-11 DIAGNOSIS — I10 Essential (primary) hypertension: Secondary | ICD-10-CM | POA: Diagnosis not present

## 2015-01-11 DIAGNOSIS — Z79899 Other long term (current) drug therapy: Secondary | ICD-10-CM | POA: Diagnosis not present

## 2015-01-11 DIAGNOSIS — S99922A Unspecified injury of left foot, initial encounter: Secondary | ICD-10-CM | POA: Diagnosis present

## 2015-01-11 MED ORDER — ACETAMINOPHEN 500 MG PO TABS
1000.0000 mg | ORAL_TABLET | Freq: Once | ORAL | Status: AC
  Administered 2015-01-11: 1000 mg via ORAL
  Filled 2015-01-11: qty 2

## 2015-01-11 NOTE — ED Notes (Signed)
Scattered bruising noted to right arm, elbow, left shoulder and ankle

## 2015-01-11 NOTE — ED Notes (Signed)
Pt refused to sign dc. Pt expresses anger related to not receiving pain medication. Dr Laneta Simmers aware. Placed in sling per pt request. WC to lobby social services contacted for bus pass

## 2015-01-11 NOTE — ED Provider Notes (Signed)
CSN: [id number]     Arrival date & time 01/11/15  0920 History   First MD Initiated Contact with Patient 01/11/15 [phone]     Chief Complaint  Patient presents with  . Fall     (Consider location/radiation/quality/duration/timing/severity/associated sxs/prior Treatment) Patient is a 34 y.o. female presenting with fall. The history is provided by the patient.  Fall This is a new problem. The current episode started 1 to 2 hours ago. The problem occurs constantly. The problem has not changed since onset.Pertinent negatives include no chest pain and no abdominal pain. Associated symptoms comments: The loss of consciousness. Nothing aggravates the symptoms. Nothing relieves the symptoms. Treatments tried: NSAIDs. The treatment provided no relief.    Past Medical History  Diagnosis Date  . Sciatica   . Hypertension   . Breast cancer     left lumpectomy 2006  . Migraines   . Breast CA   . Uterine cancer   . Stroke 2013  . Diverticulitis    Past Surgical History  Procedure Laterality Date  . Abdominal hysterectomy    . Breast lumpectomy Left   . Carpal tunnel release Right   . Cholecystectomy     No family history on file. History  Substance Use Topics  . Smoking status: Current Every Day Smoker -- 0.50 packs/day for 20 years    Types: Cigarettes  . Smokeless tobacco: Never Used  . Alcohol Use: Yes     Comment: rare   OB History    No data available     Review of Systems  Cardiovascular: Negative for chest pain.  Gastrointestinal: Negative for abdominal pain.  All other systems reviewed and are negative.     Allergies  Reglan; Toradol; and Zofran  Home Medications   Prior to Admission medications   Medication Sig Start Date End Date Taking? Authorizing Provider  Aspirin-Acetaminophen-Caffeine (EXCEDRIN PO) Take 2 tablets by mouth daily as needed (general pain).   Yes Historical Provider, MD  carvedilol (COREG) 6.25 MG tablet Take 1 tablet (6.25 mg total) by mouth 2  (two) times daily with a meal. 10/18/14  Yes Billy Fischer, MD  lisinopril-hydrochlorothiazide (PRINZIDE,ZESTORETIC) 20-12.5 MG per tablet Take 1 tablet by mouth daily. 10/18/14  Yes Billy Fischer, MD  loratadine (CLARITIN) 10 MG tablet Take 1 tablet (10 mg total) by mouth daily. 09/05/14  Yes Clayton Bibles, PA-C  albuterol (PROVENTIL HFA;VENTOLIN HFA) 108 (90 BASE) MCG/ACT inhaler Inhale 1-2 puffs into the lungs every 6 (six) hours as needed for wheezing or shortness of breath.    Historical Provider, MD  traMADol (ULTRAM) 50 MG tablet Take 1 tablet (50 mg total) by mouth every 6 (six) hours as needed. Patient not taking: Reported on 12/28/2014 11/16/14   Virgel Manifold, MD   BP 142/85 mmHg  Pulse 83  Temp(Src) 98.5 F (36.9 C) (Oral)  Resp 16  Ht 5\' 4"  (1.626 m)  Wt 150 lb (68.04 kg)  BMI 25.73 kg/m2  SpO2 100% Physical Exam  Constitutional: She is oriented to person, place, and time. She appears well-developed and well-nourished. No distress.  HENT:  Head: Normocephalic and atraumatic.  Eyes: Conjunctivae are normal.  Neck: Neck supple. No tracheal deviation present.  Cardiovascular: Normal rate and regular rhythm.   Pulmonary/Chest: Effort normal. No respiratory distress.  Abdominal: Soft. She exhibits no distension.  Musculoskeletal:       Right forearm: She exhibits tenderness and swelling (and contusion of proximal volar forearm). She exhibits no deformity.  Left foot: There is tenderness (Point tender posterior to lateral malleolus with some overlying ecchymosis).  Neurological: She is alert and oriented to person, place, and time. No cranial nerve deficit. GCS eye subscore is 4. GCS verbal subscore is 5. GCS motor subscore is 6.  Skin: Skin is warm and dry.  Psychiatric: She has a normal mood and affect.    ED Course  Procedures (including critical care time) Labs Review Labs Reviewed - No data to display  Imaging Review Dg Forearm Right  01/11/2015   CLINICAL DATA:  Fall  in shower this morning with right arm pain, initial encounter  EXAM: RIGHT FOREARM - 2 VIEW  COMPARISON:  None.  FINDINGS: There is no evidence of fracture or other focal bone lesions. Soft tissues are unremarkable.  IMPRESSION: No acute abnormality noted.   Electronically Signed   By: Inez Catalina M.D.   On: 01/11/2015 11:36   I independently viewed and interpreted the above radiology studies and agree with radiologist report.    EKG Interpretation None      MDM   Final diagnoses:  Forearm contusion, right, initial encounter  Foot contusion, left, initial encounter  Drug-seeking behavior   34 year old female presents with fall from standing while in her bathtub this morning, she sustained a contusion to her right volar forearm without obvious deformity and has stiffness of her right elbow. She also has some bruising over her left posterior ankle on the lateral surface without malleolar tenderness and was able to a blade in the emergency department. No plain films are indicated for the left ankle, will shoot x-ray of right forearm to rule out fracture and offered supportive care with Tylenol and ice, patient took naproxen prior to arrival.  Patient becomes upset with ongoing pain, patient has 11 controlled substance prescriptions over the last 4 months including 60 tablets of tramadol for 15 days filled twice in the last 12 days. I explain to her that she has a very concerning pattern for narcotic abuse, she falsified her prescribing history stating that it has been several weeks since she last received a prescription for any pain medicine. When she was confronted about this she agreed to discharge.  Leo Grosser, MD 01/11/15 [phone]

## 2015-01-11 NOTE — ED Notes (Signed)
Pt states fell in shower this am and states hit left face, has pain with bruise to right forearm, bruise to left posterior upper arm, and left ankle pain

## 2015-01-11 NOTE — Discharge Instructions (Signed)
Contusion °A contusion is a deep bruise. Contusions are the result of an injury that caused bleeding under the skin. The contusion may turn blue, purple, or yellow. Minor injuries will give you a painless contusion, but more severe contusions may stay painful and swollen for a few weeks.  °CAUSES  °A contusion is usually caused by a blow, trauma, or direct force to an area of the body. °SYMPTOMS  °· Swelling and redness of the injured area. °· Bruising of the injured area. °· Tenderness and soreness of the injured area. °· Pain. °DIAGNOSIS  °The diagnosis can be made by taking a history and physical exam. An X-ray, CT scan, or MRI may be needed to determine if there were any associated injuries, such as fractures. °TREATMENT  °Specific treatment will depend on what area of the body was injured. In general, the best treatment for a contusion is resting, icing, elevating, and applying cold compresses to the injured area. Over-the-counter medicines may also be recommended for pain control. Ask your caregiver what the best treatment is for your contusion. °HOME CARE INSTRUCTIONS  °· Put ice on the injured area. °¨ Put ice in a plastic bag. °¨ Place a towel between your skin and the bag. °¨ Leave the ice on for 15-20 minutes, 3-4 times a day, or as directed by your health care provider. °· Only take over-the-counter or prescription medicines for pain, discomfort, or fever as directed by your caregiver. Your caregiver may recommend avoiding anti-inflammatory medicines (aspirin, ibuprofen, and naproxen) for 48 hours because these medicines may increase bruising. °· Rest the injured area. °· If possible, elevate the injured area to reduce swelling. °SEEK IMMEDIATE MEDICAL CARE IF:  °· You have increased bruising or swelling. °· You have pain that is getting worse. °· Your swelling or pain is not relieved with medicines. °MAKE SURE YOU:  °· Understand these instructions. °· Will watch your condition. °· Will get help right  away if you are not doing well or get worse. °Document Released: 03/01/2005 Document Revised: 05/27/2013 Document Reviewed: 03/27/2011 °[employer]® Patient Information ©2015 [employer]. This information is not intended to replace advice given to you by your health care provider. Make sure you discuss any questions you have with your health care provider. ° °

## 2015-01-11 NOTE — ED Notes (Signed)
Dr Laneta Simmers at bedside.

## 2015-01-14 ENCOUNTER — Encounter (HOSPITAL_COMMUNITY): Payer: Self-pay | Admitting: Emergency Medicine

## 2015-01-14 ENCOUNTER — Emergency Department (INDEPENDENT_AMBULATORY_CARE_PROVIDER_SITE_OTHER)
Admission: EM | Admit: 2015-01-14 | Discharge: 2015-01-14 | Disposition: A | Discharge status: Home / Self Care | Payer: Medicaid Other | Source: Home / Self Care | Attending: Family Medicine | Admitting: Family Medicine

## 2015-01-14 DIAGNOSIS — M7918 Myalgia, other site: Secondary | ICD-10-CM

## 2015-01-14 DIAGNOSIS — M25521 Pain in right elbow: Secondary | ICD-10-CM

## 2015-01-14 DIAGNOSIS — M791 Myalgia: Secondary | ICD-10-CM

## 2015-01-14 DIAGNOSIS — S9032XD Contusion of left foot, subsequent encounter: Secondary | ICD-10-CM

## 2015-01-14 DIAGNOSIS — W19XXXD Unspecified fall, subsequent encounter: Secondary | ICD-10-CM

## 2015-01-14 MED ORDER — DICLOFENAC POTASSIUM 50 MG PO TABS: 50.0000 mg | ORAL_TABLET | Freq: Three times a day (TID) | ORAL | Status: DC

## 2015-01-14 NOTE — Discharge Instructions (Signed)
Contusion °A contusion is a deep bruise. Contusions are the result of an injury that caused bleeding under the skin. The contusion may turn blue, purple, or yellow. Minor injuries will give you a painless contusion, but more severe contusions may stay painful and swollen for a few weeks.  °CAUSES  °A contusion is usually caused by a blow, trauma, or direct force to an area of the body. °SYMPTOMS  °· Swelling and redness of the injured area. °· Bruising of the injured area. °· Tenderness and soreness of the injured area. °· Pain. °DIAGNOSIS  °The diagnosis can be made by taking a history and physical exam. An X-ray, CT scan, or MRI may be needed to determine if there were any associated injuries, such as fractures. °TREATMENT  °Specific treatment will depend on what area of the body was injured. In general, the best treatment for a contusion is resting, icing, elevating, and applying cold compresses to the injured area. Over-the-counter medicines may also be recommended for pain control. Ask your caregiver what the best treatment is for your contusion. °HOME CARE INSTRUCTIONS  °· Put ice on the injured area. °¨ Put ice in a plastic bag. °¨ Place a towel between your skin and the bag. °¨ Leave the ice on for 15-20 minutes, 3-4 times a day, or as directed by your health care provider. °· Only take over-the-counter or prescription medicines for pain, discomfort, or fever as directed by your caregiver. Your caregiver may recommend avoiding anti-inflammatory medicines (aspirin, ibuprofen, and naproxen) for 48 hours because these medicines may increase bruising. °· Rest the injured area. °· If possible, elevate the injured area to reduce swelling. °SEEK IMMEDIATE MEDICAL CARE IF:  °· You have increased bruising or swelling. °· You have pain that is getting worse. °· Your swelling or pain is not relieved with medicines. °MAKE SURE YOU:  °· Understand these instructions. °· Will watch your condition. °· Will get help right  away if you are not doing well or get worse. °Document Released: 03/01/2005 Document Revised: 05/27/2013 Document Reviewed: 03/27/2011 °[employer]® Patient Information ©2015 [employer]. This information is not intended to replace advice given to you by your health care provider. Make sure you discuss any questions you have with your health care provider. ° °

## 2015-01-14 NOTE — ED Provider Notes (Signed)
CSN: [id number]     Arrival date & time 01/14/15  1921 History   First MD Initiated Contact with Patient 01/14/15 1946     Chief Complaint  Patient presents with  . Elbow Pain   (Consider location/radiation/quality/duration/timing/severity/associated sxs/prior Treatment) HPI Comments: 15 showed female states that she fell in the bathtub on Monday, August 8. She was seen in the emergency department a couple of hours after the fall and underwent evaluation for complaints of right elbow pain, left ankle pain, neck pain and other areas of muscle pain. Radiographs of the elbow and forearm are negative. Clinical exam of the left ankle and other areas did not support and need for x-rays. She presents today with persistent pain unrelieved with Aleve and tramadol. She has had several visits to the urgent care and emergency department in the past few months for various injuries and pain has received several prescriptions of tramadol, Percocet and Norco. She states the tramadol makes her sick, stomach as well has muscle relaxants.   Past Medical History  Diagnosis Date  . Sciatica   . Hypertension   . Breast cancer     left lumpectomy 2006  . Migraines   . Breast CA   . Uterine cancer   . Stroke 2013  . Diverticulitis    Past Surgical History  Procedure Laterality Date  . Abdominal hysterectomy    . Breast lumpectomy Left   . Carpal tunnel release Right   . Cholecystectomy     No family history on file. Social History  Substance Use Topics  . Smoking status: Current Every Day Smoker -- 0.50 packs/day for 20 years    Types: Cigarettes  . Smokeless tobacco: Never Used  . Alcohol Use: Yes     Comment: rare   OB History    No data available     Review of Systems  Constitutional: Negative for fever, chills and activity change.  HENT: Negative.   Respiratory: Negative.   Cardiovascular: Negative.   Musculoskeletal: Positive for myalgias and back pain.       As per HPI  Skin: Negative  for color change, pallor and rash.  Neurological: Negative.     Allergies  Reglan; Toradol; and Zofran  Home Medications   Prior to Admission medications   Medication Sig Start Date End Date Taking? Authorizing Provider  albuterol (PROVENTIL HFA;VENTOLIN HFA) 108 (90 BASE) MCG/ACT inhaler Inhale 1-2 puffs into the lungs every 6 (six) hours as needed for wheezing or shortness of breath.    Historical Provider, MD  Aspirin-Acetaminophen-Caffeine (EXCEDRIN PO) Take 2 tablets by mouth daily as needed (general pain).    Historical Provider, MD  carvedilol (COREG) 6.25 MG tablet Take 1 tablet (6.25 mg total) by mouth 2 (two) times daily with a meal. 10/18/14   Billy Fischer, MD  diclofenac (CATAFLAM) 50 MG tablet Take 1 tablet (50 mg total) by mouth 3 (three) times daily. One tablet TID with food prn pain. 01/14/15   Janne Napoleon, NP  lisinopril-hydrochlorothiazide (PRINZIDE,ZESTORETIC) 20-12.5 MG per tablet Take 1 tablet by mouth daily. 10/18/14   Billy Fischer, MD  loratadine (CLARITIN) 10 MG tablet Take 1 tablet (10 mg total) by mouth daily. 09/05/14   Clayton Bibles, PA-C  traMADol (ULTRAM) 50 MG tablet Take 1 tablet (50 mg total) by mouth every 6 (six) hours as needed. Patient not taking: Reported on 12/28/2014 11/16/14   Virgel Manifold, MD   BP 151/100 mmHg  Temp(Src) 98.8 F (37.1 C)  Resp 18  SpO2 97% Physical Exam  Constitutional: She is oriented to person, place, and time. She appears well-developed and well-nourished. No distress.  HENT:  Head: Normocephalic and atraumatic.  Eyes: EOM are normal.  Neck: Normal range of motion. Neck supple.  Cardiovascular: Normal rate.   Pulmonary/Chest: Effort normal. No respiratory distress.  Musculoskeletal:  Exam of the neck and back reveals tenderness over the bilateral trapezii muscles as well as the para lumbar muscles. She states palpation of these areas actually feel good. Palpation of the elbow reveals tenderness over the olecranon process. There  is no localized swelling, discoloration, deformity or other visible or palpable evidence of injury. She has good range of motion of the elbow and forearm. The left ankle with mild tenderness and light ecchymosis to the Achilles tendon. She has full range of motion of the ankle and full weightbearing. She had a concern about a knot in the ulnar aspect of the left hand. The knot that she was concerned about is actually the fifth metacarpal head. There is mild tenderness there but no swelling, deformity or abnormal mobility. She is able to completely make a fist, flex and extend the fourth and fifth digits, flex and extend the wrist without limitation or pain. There is no visible abnormality.  Neurological: She is alert and oriented to person, place, and time. No cranial nerve deficit. She exhibits normal muscle tone. Coordination normal.  Skin: Skin is warm and dry.  Nursing note and vitals reviewed.   ED Course  Procedures (including critical care time) Labs Review Labs Reviewed - No data to display  Imaging Review No results found.   MDM   1. Musculoskeletal pain   2. Right elbow pain   3. Contusion of heel, left, subsequent encounter   4. Fall, subsequent encounter    Continue taking your tramadol for pain.  Cataflam 50 mg 3 times a day when necessary pain. She has been taking Aleve she should stop that now. Apply ice the areas of pain. Follow-up with Dr. Percell Miller on Tuesday and keep her appointment.    Janne Napoleon, NP 01/14/15 2107

## 2015-01-14 NOTE — ED Notes (Signed)
Reports a fall on Monday 8/8.  Patient reports going to ed, treated and released.  Patient is in the department today for continued pain, ultram at home is not helping with this pain.

## 2015-02-08 ENCOUNTER — Encounter (HOSPITAL_COMMUNITY): Payer: Self-pay | Admitting: Emergency Medicine

## 2015-02-08 ENCOUNTER — Emergency Department (HOSPITAL_COMMUNITY)
Admission: EM | Admit: 2015-02-08 | Discharge: 2015-02-08 | Disposition: A | Discharge status: Home / Self Care | Payer: Medicaid Other | Attending: Emergency Medicine | Admitting: Emergency Medicine

## 2015-02-08 DIAGNOSIS — K6289 Other specified diseases of anus and rectum: Secondary | ICD-10-CM | POA: Diagnosis present

## 2015-02-08 DIAGNOSIS — K644 Residual hemorrhoidal skin tags: Secondary | ICD-10-CM | POA: Diagnosis not present

## 2015-02-08 DIAGNOSIS — Z720 Tobacco use: Secondary | ICD-10-CM | POA: Insufficient documentation

## 2015-02-08 DIAGNOSIS — Z9049 Acquired absence of other specified parts of digestive tract: Secondary | ICD-10-CM | POA: Insufficient documentation

## 2015-02-08 DIAGNOSIS — Z79899 Other long term (current) drug therapy: Secondary | ICD-10-CM | POA: Diagnosis not present

## 2015-02-08 DIAGNOSIS — K602 Anal fissure, unspecified: Secondary | ICD-10-CM | POA: Insufficient documentation

## 2015-02-08 DIAGNOSIS — I10 Essential (primary) hypertension: Secondary | ICD-10-CM | POA: Insufficient documentation

## 2015-02-08 DIAGNOSIS — Z90710 Acquired absence of both cervix and uterus: Secondary | ICD-10-CM | POA: Insufficient documentation

## 2015-02-08 DIAGNOSIS — Z791 Long term (current) use of non-steroidal anti-inflammatories (NSAID): Secondary | ICD-10-CM | POA: Insufficient documentation

## 2015-02-08 DIAGNOSIS — Z8673 Personal history of transient ischemic attack (TIA), and cerebral infarction without residual deficits: Secondary | ICD-10-CM | POA: Insufficient documentation

## 2015-02-08 DIAGNOSIS — Z853 Personal history of malignant neoplasm of breast: Secondary | ICD-10-CM | POA: Diagnosis not present

## 2015-02-08 DIAGNOSIS — Z8542 Personal history of malignant neoplasm of other parts of uterus: Secondary | ICD-10-CM | POA: Insufficient documentation

## 2015-02-08 MED ORDER — LIDOCAINE 5 % EX OINT: 1.0000 "application " | TOPICAL_OINTMENT | CUTANEOUS | Status: DC | PRN

## 2015-02-08 NOTE — ED Provider Notes (Signed)
CSN: [id number]     Arrival date & time 02/08/15  1514 History  This chart was scribed for Felicia Senior, PA-C, working with Merrily Pew, MD by Starleen Arms, ED Scribe. This patient was seen in room WTR6/WTR6 and the patient's care was started at 4:45 PM.   Chief Complaint  Patient presents with  . Hemorrhoids   The history is provided by the patient. No language interpreter was used.   HPI Comments: Felicia Moore is a 34 y.o. female with hx of hemorrhoid (post pregnancy, resolved) who presents to the Emergency Department complaining of worsening, moderate anal pain onset 4-5 days ago, worse with walking, pressure.  Associate symptoms include blood only when wiping.  She has used Preparation H and Tucks pads without relief.  She denies constipation, changes in bowel patterns, fever.    Past Medical History  Diagnosis Date  . Sciatica   . Hypertension   . Breast cancer     left lumpectomy 2006  . Migraines   . Breast CA   . Uterine cancer   . Stroke 2013  . Diverticulitis    Past Surgical History  Procedure Laterality Date  . Abdominal hysterectomy    . Breast lumpectomy Left   . Carpal tunnel release Right   . Cholecystectomy     History reviewed. No pertinent family history. Social History  Substance Use Topics  . Smoking status: Current Every Day Smoker -- 0.50 packs/day for 20 years    Types: Cigarettes  . Smokeless tobacco: Never Used  . Alcohol Use: Yes     Comment: rare   OB History    No data available     Review of Systems  Constitutional: Negative for fever.  Gastrointestinal: Positive for anal bleeding and rectal pain. Negative for constipation.      Allergies  Reglan; Toradol; and Zofran  Home Medications   Prior to Admission medications   Medication Sig Start Date End Date Taking? Authorizing Provider  albuterol (PROVENTIL HFA;VENTOLIN HFA) 108 (90 BASE) MCG/ACT inhaler Inhale 1-2 puffs into the lungs every 6 (six) hours as needed for  wheezing or shortness of breath.    Historical Provider, MD  Aspirin-Acetaminophen-Caffeine (EXCEDRIN PO) Take 2 tablets by mouth daily as needed (general pain).    Historical Provider, MD  carvedilol (COREG) 6.25 MG tablet Take 1 tablet (6.25 mg total) by mouth 2 (two) times daily with a meal. 10/18/14   Billy Fischer, MD  diclofenac (CATAFLAM) 50 MG tablet Take 1 tablet (50 mg total) by mouth 3 (three) times daily. One tablet TID with food prn pain. 01/14/15   Janne Napoleon, NP  lisinopril-hydrochlorothiazide (PRINZIDE,ZESTORETIC) 20-12.5 MG per tablet Take 1 tablet by mouth daily. 10/18/14   Billy Fischer, MD  loratadine (CLARITIN) 10 MG tablet Take 1 tablet (10 mg total) by mouth daily. 09/05/14   Clayton Bibles, PA-C  traMADol (ULTRAM) 50 MG tablet Take 1 tablet (50 mg total) by mouth every 6 (six) hours as needed. Patient not taking: Reported on 12/28/2014 11/16/14   Virgel Manifold, MD   BP 133/81 mmHg  Pulse 90  Temp(Src) 98.2 F (36.8 C) (Oral)  Resp 18  SpO2 100% Physical Exam  Constitutional: She is oriented to person, place, and time. She appears well-developed and well-nourished. No distress.  HENT:  Head: Normocephalic and atraumatic.  Eyes: Conjunctivae and EOM are normal.  Neck: Neck supple. No tracheal deviation present.  Cardiovascular: Normal rate.   Pulmonary/Chest: Effort normal. No respiratory distress.  Genitourinary:  1 small non thrombosed external hemorrhoid that is non tender. ThereR multiple small fissures to the anus.  Musculoskeletal: Normal range of motion.  Neurological: She is alert and oriented to person, place, and time.  Skin: Skin is warm and dry.  Psychiatric: She has a normal mood and affect. Her behavior is normal.  Nursing note and vitals reviewed.   ED Course  Procedures (including critical care time)  DIAGNOSTIC STUDIES: Oxygen Saturation is 100% on RA, normal by my interpretation.    COORDINATION OF CARE:  4:49 PM Discussed treatment plan with  patient at bedside.  Patient acknowledges and agrees with plan.    Labs Review Labs Reviewed - No data to display  Imaging Review No results found. I have personally reviewed and evaluated these images and lab results as part of my medical decision-making.   EKG Interpretation None      MDM   Final diagnoses:  Anal fissure   Patient with anal fissures. Will add lidocaine to her medications. Sitz baths. Stool softener. Follow-up as needed.  Filed Vitals:   02/08/15 1603 02/08/15 1711  BP: 133/81 141/97  Pulse: 90 75  Temp: 98.2 F (36.8 C)   TempSrc: Oral   Resp: 18   SpO2: 100% 100%    I personally performed the services described in this documentation, which was scribed in my presence. The recorded information has been reviewed and is accurate.   Felicia Senior, PA-C 02/08/15 1723  Merrily Pew, MD 02/09/15 1416

## 2015-02-08 NOTE — ED Notes (Signed)
Pt c/o hemorrhoid pain, states hemorrhoids bleed mildly with wiping. Denies constipation. States she has been doing heavy lifting at work.

## 2015-02-08 NOTE — Discharge Instructions (Signed)
Apply lidocaine topically. Take a stool softener. Warm baths several times a day. Please follow with primary care doctor   Anal Fissure, Adult An anal fissure is a small tear or crack in the skin around the anus. Bleeding from a fissure usually stops on its own within a few minutes. However, bleeding will often reoccur with each bowel movement until the crack heals.  CAUSES   Passing large, hard stools.  Frequent diarrheal stools.  Constipation.  Inflammatory bowel disease (Crohn's disease or ulcerative colitis).  Infections.  Anal sex. SYMPTOMS   Small amounts of blood seen on your stools, on toilet paper, or in the toilet after a bowel movement.  Rectal bleeding.  Painful bowel movements.  Itching or irritation around the anus. DIAGNOSIS Your caregiver will examine the anal area. An anal fissure can usually be seen with careful inspection. A rectal exam may be performed and a short tube (anoscope) may be used to examine the anal canal. TREATMENT   You may be instructed to take fiber supplements. These supplements can soften your stool to help make bowel movements easier.  Sitz baths may be recommended to help heal the tear. Do not use soap in the sitz baths.  A medicated cream or ointment may be prescribed to lessen discomfort. HOME CARE INSTRUCTIONS   Maintain a diet high in fruits, whole grains, and vegetables. Avoid constipating foods like bananas and dairy products.  Take sitz baths as directed by your caregiver.  Drink enough fluids to keep your urine clear or pale yellow.  Only take over-the-counter or prescription medicines for pain, discomfort, or fever as directed by your caregiver. Do not take aspirin as this may increase bleeding.  Do not use ointments containing numbing medications (anesthetics) or hydrocortisone. They could slow healing. SEEK MEDICAL CARE IF:   Your fissure is not completely healed within 3 days.  You have further bleeding.  You  have a fever.  You have diarrhea mixed with blood.  You have pain.  Your problem is getting worse rather than better. MAKE SURE YOU:   Understand these instructions.  Will watch your condition.  Will get help right away if you are not doing well or get worse. Document Released: 05/22/2005 Document Revised: 08/14/2011 Document Reviewed: 11/06/2010 [facility] Patient Information 2015 [place], [place]. This information is not intended to replace advice given to you by your health care provider. Make sure you discuss any questions you have with your health care provider.

## 2015-06-09 ENCOUNTER — Emergency Department (HOSPITAL_COMMUNITY)
Admission: EM | Admit: 2015-06-09 | Discharge: 2015-06-09 | Disposition: A | Discharge status: Home / Self Care | Payer: Medicaid Other | Attending: Emergency Medicine | Admitting: Emergency Medicine

## 2015-06-09 ENCOUNTER — Encounter (HOSPITAL_COMMUNITY): Payer: Self-pay | Admitting: Emergency Medicine

## 2015-06-09 ENCOUNTER — Emergency Department (HOSPITAL_COMMUNITY): Payer: Medicaid Other

## 2015-06-09 DIAGNOSIS — Z8673 Personal history of transient ischemic attack (TIA), and cerebral infarction without residual deficits: Secondary | ICD-10-CM | POA: Insufficient documentation

## 2015-06-09 DIAGNOSIS — Z79899 Other long term (current) drug therapy: Secondary | ICD-10-CM | POA: Diagnosis not present

## 2015-06-09 DIAGNOSIS — Z8541 Personal history of malignant neoplasm of cervix uteri: Secondary | ICD-10-CM | POA: Diagnosis not present

## 2015-06-09 DIAGNOSIS — K310 Acute dilatation of stomach: Secondary | ICD-10-CM | POA: Insufficient documentation

## 2015-06-09 DIAGNOSIS — R509 Fever, unspecified: Secondary | ICD-10-CM | POA: Diagnosis not present

## 2015-06-09 DIAGNOSIS — R112 Nausea with vomiting, unspecified: Secondary | ICD-10-CM | POA: Diagnosis not present

## 2015-06-09 DIAGNOSIS — F17210 Nicotine dependence, cigarettes, uncomplicated: Secondary | ICD-10-CM | POA: Diagnosis not present

## 2015-06-09 DIAGNOSIS — G43909 Migraine, unspecified, not intractable, without status migrainosus: Secondary | ICD-10-CM | POA: Diagnosis not present

## 2015-06-09 DIAGNOSIS — R109 Unspecified abdominal pain: Secondary | ICD-10-CM

## 2015-06-09 DIAGNOSIS — Z8739 Personal history of other diseases of the musculoskeletal system and connective tissue: Secondary | ICD-10-CM | POA: Insufficient documentation

## 2015-06-09 DIAGNOSIS — Z791 Long term (current) use of non-steroidal anti-inflammatories (NSAID): Secondary | ICD-10-CM | POA: Diagnosis not present

## 2015-06-09 DIAGNOSIS — R1032 Left lower quadrant pain: Secondary | ICD-10-CM | POA: Diagnosis not present

## 2015-06-09 DIAGNOSIS — R1012 Left upper quadrant pain: Secondary | ICD-10-CM | POA: Diagnosis present

## 2015-06-09 DIAGNOSIS — Z8719 Personal history of other diseases of the digestive system: Secondary | ICD-10-CM | POA: Insufficient documentation

## 2015-06-09 DIAGNOSIS — I10 Essential (primary) hypertension: Secondary | ICD-10-CM | POA: Diagnosis not present

## 2015-06-09 DIAGNOSIS — Z853 Personal history of malignant neoplasm of breast: Secondary | ICD-10-CM | POA: Insufficient documentation

## 2015-06-09 LAB — COMPREHENSIVE METABOLIC PANEL
ALT: 15 U/L (ref 14–54)
ANION GAP: 9 (ref 5–15)
AST: 19 U/L (ref 15–41)
Albumin: 3.9 g/dL (ref 3.5–5.0)
Alkaline Phosphatase: 60 U/L (ref 38–126)
BUN: 22 mg/dL — ABNORMAL HIGH (ref 6–20)
CO2: 25 mmol/L (ref 22–32)
Calcium: 9.3 mg/dL (ref 8.9–10.3)
Chloride: 106 mmol/L (ref 101–111)
Creatinine, Ser: 0.72 mg/dL (ref 0.44–1.00)
GFR calc non Af Amer: >60 mL/min (ref >60)
Glucose, Bld: 134 mg/dL — ABNORMAL HIGH (ref 65–99)
Potassium: 3.8 mmol/L (ref 3.5–5.1)
SODIUM: 140 mmol/L (ref 135–145)
Total Bilirubin: 0.3 mg/dL (ref 0.3–1.2)
Total Protein: 6.7 g/dL (ref 6.5–8.1)

## 2015-06-09 LAB — CBC WITH DIFFERENTIAL/PLATELET
Basophils Absolute: 0 10*3/uL (ref 0.0–0.1)
Basophils Relative: 0 %
EOS ABS: 0.3 10*3/uL (ref 0.0–0.7)
EOS PCT: 4 %
HCT: 39.7 % (ref 36.0–46.0)
Hemoglobin: 13 g/dL (ref 12.0–15.0)
LYMPHS ABS: 2.1 10*3/uL (ref 0.7–4.0)
Lymphocytes Relative: 36 %
MCH: 33.3 pg (ref 26.0–34.0)
MCHC: 32.7 g/dL (ref 30.0–36.0)
MCV: 101.8 fL — ABNORMAL HIGH (ref 78.0–100.0)
Monocytes Absolute: 0.3 10*3/uL (ref 0.1–1.0)
Monocytes Relative: 5 %
Neutro Abs: 3.2 10*3/uL (ref 1.7–7.7)
Neutrophils Relative %: 55 %
Platelets: 356 10*3/uL (ref 150–400)
RBC: 3.9 MIL/uL (ref 3.87–5.11)
RDW: 12.8 % (ref 11.5–15.5)
WBC: 5.9 10*3/uL (ref 4.0–10.5)

## 2015-06-09 LAB — LIPASE, BLOOD: Lipase: 23 U/L (ref 11–51)

## 2015-06-09 MED ORDER — SODIUM CHLORIDE 0.9 % IV BOLUS (SEPSIS)
2000.0000 mL | Freq: Once | INTRAVENOUS | Status: AC
  Administered 2015-06-09: 2000 mL via INTRAVENOUS

## 2015-06-09 MED ORDER — PROMETHAZINE HCL 50 MG PO TABS: 25.0000 mg | ORAL_TABLET | Freq: Four times a day (QID) | ORAL | Status: DC | PRN

## 2015-06-09 MED ORDER — IOHEXOL 300 MG/ML  SOLN
25.0000 mL | Freq: Once | INTRAMUSCULAR | Status: AC | PRN
  Administered 2015-06-09: 25 mL via ORAL

## 2015-06-09 MED ORDER — HYDROCODONE-ACETAMINOPHEN 5-325 MG PO TABS: 1.0000 | ORAL_TABLET | Freq: Four times a day (QID) | ORAL | Status: DC | PRN

## 2015-06-09 MED ORDER — IOHEXOL 300 MG/ML  SOLN
100.0000 mL | Freq: Once | INTRAMUSCULAR | Status: AC | PRN
  Administered 2015-06-09: 100 mL via INTRAVENOUS

## 2015-06-09 MED ORDER — MORPHINE SULFATE (PF) 4 MG/ML IV SOLN
6.0000 mg | Freq: Once | INTRAVENOUS | Status: AC
  Administered 2015-06-09: 6 mg via INTRAVENOUS
  Filled 2015-06-09: qty 2

## 2015-06-09 MED ORDER — PROMETHAZINE HCL 25 MG/ML IJ SOLN
12.5000 mg | Freq: Once | INTRAMUSCULAR | Status: AC
  Administered 2015-06-09: 12.5 mg via INTRAVENOUS
  Filled 2015-06-09: qty 1

## 2015-06-09 MED ORDER — HYDROCODONE-ACETAMINOPHEN 5-325 MG PO TABS
1.0000 | ORAL_TABLET | Freq: Once | ORAL | Status: AC
  Administered 2015-06-09: 1 via ORAL
  Filled 2015-06-09: qty 1

## 2015-06-09 NOTE — ED Notes (Signed)
Pt states hx of diverticulitis.  States that she has been having lt sided abd pain and swelling to abd on Monday.  States that she has had continued pain.  States that she has the urge to defecate but can only pass a small stool.  Pt also states she has been vomiting.

## 2015-06-09 NOTE — Discharge Instructions (Signed)
Abdominal Pain, Adult Stay on clear liquids for the next 24 hours, juice, Jell-O, broth. Then you can advance your diet to whatever you like. Take the pain medicine prescribed for bad pain or Tylenol for mild pain. Don't take the pain medicine prescribed together with Tylenol as the combination could be dangerous to your liver. See Dr. Jimmye Norman in the office if you have continued pain in 48 hours or are not improving. Return if your condition worsens for any reason. Many things can cause abdominal pain. Usually, abdominal pain is not caused by a disease and will improve without treatment. It can often be observed and treated at home. Your health care provider will do a physical exam and possibly order blood tests and X-rays to help determine the seriousness of your pain. However, in many cases, more time must pass before a clear cause of the pain can be found. Before that point, your health care provider may not know if you need more testing or further treatment. HOME CARE INSTRUCTIONS Monitor your abdominal pain for any changes. The following actions may help to alleviate any discomfort you are experiencing:  Only take over-the-counter or prescription medicines as directed by your health care provider.  Do not take laxatives unless directed to do so by your health care provider.  Try a clear liquid diet (broth, tea, or water) as directed by your health care provider. Slowly move to a bland diet as tolerated. SEEK MEDICAL CARE IF:  You have unexplained abdominal pain.  You have abdominal pain associated with nausea or diarrhea.  You have pain when you urinate or have a bowel movement.  You experience abdominal pain that wakes you in the night.  You have abdominal pain that is worsened or improved by eating food.  You have abdominal pain that is worsened with eating fatty foods.  You have a fever. SEEK IMMEDIATE MEDICAL CARE IF:  Your pain does not go away within 2 hours.  You keep  throwing up (vomiting).  Your pain is felt only in portions of the abdomen, such as the right side or the left lower portion of the abdomen.  You pass bloody or black tarry stools. MAKE SURE YOU:  Understand these instructions.  Will watch your condition.  Will get help right away if you are not doing well or get worse.   This information is not intended to replace advice given to you by your health care provider. Make sure you discuss any questions you have with your health care provider.   Document Released: 03/01/2005 Document Revised: 02/10/2015 Document Reviewed: 01/29/2013 Elsevier Interactive Patient Education [employer].

## 2015-06-09 NOTE — ED Provider Notes (Signed)
CSN: [id number]     Arrival date & time 06/09/15  0746 History   First MD Initiated Contact with Patient 06/09/15 0800     Chief Complaint  Patient presents with  . Abdominal Pain     (Consider location/radiation/quality/duration/timing/severity/associated sxs/prior Treatment) HPI Complete of left-sided abdominal pain at left upper and left lower quadrants onset 4 days ago, gradual feels like diverticulitis that she's had in the past she's vomited 45 times since onset of pain. Presently complains of nausea. Associated symptoms include fever with maximum temperature 100.8. Pain is worse with movement improved with remaining still. Bowel movements have been small since onset of symptoms. No blood per rectum. Other associated symptoms include feeling of abdomen being distended No other associated symptoms. Pain is moderate to severe present.  Past Medical History  Diagnosis Date  . Sciatica   . Hypertension   . Breast cancer ([place])     left lumpectomy 2006  . Migraines   . Breast CA ([place])   . Uterine cancer ([place])   . Stroke [facility]) 2013  . Diverticulitis    Past Surgical History  Procedure Laterality Date  . Abdominal hysterectomy    . Breast lumpectomy Left   . Carpal tunnel release Right   . Cholecystectomy     No family history on file. Social History  Substance Use Topics  . Smoking status: Current Every Day Smoker -- 0.50 packs/day for 20 years    Types: Cigarettes  . Smokeless tobacco: Never Used  . Alcohol Use: Yes     Comment: rare   OB History    No data available     Review of Systems  Constitutional: Positive for fever.  HENT: Negative.   Respiratory: Negative.   Cardiovascular: Negative.   Gastrointestinal: Positive for nausea, vomiting, abdominal pain and abdominal distention.  Musculoskeletal: Negative.   Skin: Negative.   Neurological: Negative.   Psychiatric/Behavioral: Negative.   All other systems reviewed and are negative.     Allergies  Reglan;  Toradol; and Zofran  Home Medications   Prior to Admission medications   Medication Sig Start Date End Date Taking? Authorizing Provider  albuterol (PROVENTIL HFA;VENTOLIN HFA) 108 (90 BASE) MCG/ACT inhaler Inhale 1-2 puffs into the lungs every 6 (six) hours as needed for wheezing or shortness of breath.    Historical Provider, MD  Aspirin-Acetaminophen-Caffeine (EXCEDRIN PO) Take 2 tablets by mouth daily as needed (general pain).    Historical Provider, MD  carvedilol (COREG) 6.25 MG tablet Take 1 tablet (6.25 mg total) by mouth 2 (two) times daily with a meal. 10/18/14   Billy Fischer, MD  diclofenac (CATAFLAM) 50 MG tablet Take 1 tablet (50 mg total) by mouth 3 (three) times daily. One tablet TID with food prn pain. 01/14/15   Janne Napoleon, NP  lidocaine (XYLOCAINE) 5 % ointment Apply 1 application topically as needed. 02/08/15   Tatyana Kirichenko, PA-C  lisinopril-hydrochlorothiazide (PRINZIDE,ZESTORETIC) 20-12.5 MG per tablet Take 1 tablet by mouth daily. 10/18/14   Billy Fischer, MD  loratadine (CLARITIN) 10 MG tablet Take 1 tablet (10 mg total) by mouth daily. 09/05/14   Clayton Bibles, PA-C  traMADol (ULTRAM) 50 MG tablet Take 1 tablet (50 mg total) by mouth every 6 (six) hours as needed. Patient not taking: Reported on 12/28/2014 11/16/14   Virgel Manifold, MD   BP 135/91 mmHg  Pulse 79  Temp(Src) 98 F (36.7 C) (Oral)  Resp 16  SpO2 99% Physical Exam  Constitutional: She appears well-developed  and well-nourished.  HENT:  Head: Normocephalic and atraumatic.  Eyes: Conjunctivae are normal. Pupils are equal, round, and reactive to light.  Neck: Neck supple. No tracheal deviation present. No thyromegaly present.  Cardiovascular: Normal rate and regular rhythm.   No murmur heard. Pulmonary/Chest: Effort normal and breath sounds normal.  Abdominal: Soft. Bowel sounds are normal. She exhibits no distension. There is tenderness. There is guarding.  Tender at left upper quadrant and left lower  quadrants  Musculoskeletal: Normal range of motion. She exhibits no edema or tenderness.  Neurological: She is alert. Coordination normal.  Skin: Skin is warm and dry. No rash noted.  Psychiatric: She has a normal mood and affect.  Nursing note and vitals reviewed.   ED Course  Procedures (including critical care time) Labs Review Labs Reviewed - No data to display  Imaging Review No results found. I have personally reviewed and evaluated these images and lab results as part of my medical decision-making.   EKG Interpretation None     11 AM pain was partially improved after treatment with intravenous morphine and Phenergan. Nausea has resolved after treatment with intravenous Phenergan. She is now requesting more pain medicine. Nausea is under control. Norco ordered Results for orders placed or performed during the hospital encounter of 06/09/15  CBC with Differential/Platelet  Result Value Ref Range   WBC 5.9 4.0 - 10.5 K/uL   RBC 3.90 3.87 - 5.11 MIL/uL   Hemoglobin 13.0 12.0 - 15.0 g/dL   HCT 39.7 36.0 - 46.0 %   MCV 101.8 (H) 78.0 - 100.0 fL   MCH 33.3 26.0 - 34.0 pg   MCHC 32.7 30.0 - 36.0 g/dL   RDW 12.8 11.5 - 15.5 %   Platelets 356 150 - 400 K/uL   Neutrophils Relative % 55 %   Neutro Abs 3.2 1.7 - 7.7 K/uL   Lymphocytes Relative 36 %   Lymphs Abs 2.1 0.7 - 4.0 K/uL   Monocytes Relative 5 %   Monocytes Absolute 0.3 0.1 - 1.0 K/uL   Eosinophils Relative 4 %   Eosinophils Absolute 0.3 0.0 - 0.7 K/uL   Basophils Relative 0 %   Basophils Absolute 0.0 0.0 - 0.1 K/uL  Comprehensive metabolic panel  Result Value Ref Range   Sodium 140 135 - 145 mmol/L   Potassium 3.8 3.5 - 5.1 mmol/L   Chloride 106 101 - 111 mmol/L   CO2 25 22 - 32 mmol/L   Glucose, Bld 134 (H) 65 - 99 mg/dL   BUN 22 (H) 6 - 20 mg/dL   Creatinine, Ser 0.72 0.44 - 1.00 mg/dL   Calcium 9.3 8.9 - 10.3 mg/dL   Total Protein 6.7 6.5 - 8.1 g/dL   Albumin 3.9 3.5 - 5.0 g/dL   AST 19 15 - 41 U/L    ALT 15 14 - 54 U/L   Alkaline Phosphatase 60 38 - 126 U/L   Total Bilirubin 0.3 0.3 - 1.2 mg/dL   GFR calc non Af Amer >60 >60 mL/min   GFR calc Af Amer >60 >60 mL/min   Anion gap 9 5 - 15  Lipase, blood  Result Value Ref Range   Lipase 23 11 - 51 U/L   Ct Abdomen Pelvis W Contrast  06/09/2015  CLINICAL DATA:  Left upper and left lower quadrant pain and tenderness. Symptoms began 2 days ago. History of diverticulitis, breast cancer, and uterine cancer. EXAM: CT ABDOMEN AND PELVIS WITH CONTRAST TECHNIQUE: Multidetector CT imaging of the abdomen  and pelvis was performed using the standard protocol following bolus administration of intravenous contrast. CONTRAST:  100 mL Omnipaque 300 IV COMPARISON:  12/09/2014 FINDINGS: Subsegmental atelectasis is noted in the lung bases. The gallbladder is surgically absent. Mild intrahepatic and extrahepatic biliary dilatation is unchanged, likely related to prior cholecystectomy. No focal liver lesion is identified. The spleen, adrenal glands, and kidneys are unremarkable. A small calcification in the uncinate process of the pancreas is unchanged. There is also minimal prominence of the pancreatic duct in the head and body which is similar to the prior study. There is likely a small sliding hiatal hernia. Oral contrast is present in multiple nondilated loops of proximal and mid small bowel without evidence of bowel obstruction. Mild prominence of the wall of the descending and sigmoid colon is likely due to underdistention without surrounding inflammatory change. Scattered colonic diverticula are noted without evidence of diverticulitis. Appendix is unremarkable. No free fluid or enlarged lymph nodes are identified. The uterus is absent. Ovaries are unremarkable. Bladder is unremarkable. No acute osseous abnormality is identified. Abdominal aorta is normal in caliber. IMPRESSION: No acute abnormality identified in the abdomen or pelvis. Underdistention of the left colon  without surrounding inflammatory change. Electronically Signed   By: Logan Bores M.D.   On: 06/09/2015 10:05    MDM  No evidence of diverticulitis or acute process on abdominal CT scan. Plan prescription Norco, Phenergan. Follow-up with Dr. Jimmye Norman if significant discomfort 48 hours Final diagnoses:  None   Diagnosis abdominal pain     Orlie Dakin, MD 06/09/15 1134

## 2015-06-30 ENCOUNTER — Emergency Department (HOSPITAL_COMMUNITY): Payer: Medicaid Other

## 2015-06-30 ENCOUNTER — Encounter (HOSPITAL_COMMUNITY): Payer: Self-pay | Admitting: Emergency Medicine

## 2015-06-30 ENCOUNTER — Emergency Department (HOSPITAL_COMMUNITY)
Admission: EM | Admit: 2015-06-30 | Discharge: 2015-06-30 | Disposition: A | Discharge status: Home / Self Care | Payer: Medicaid Other | Attending: Emergency Medicine | Admitting: Emergency Medicine

## 2015-06-30 DIAGNOSIS — Z8673 Personal history of transient ischemic attack (TIA), and cerebral infarction without residual deficits: Secondary | ICD-10-CM | POA: Diagnosis not present

## 2015-06-30 DIAGNOSIS — Z3202 Encounter for pregnancy test, result negative: Secondary | ICD-10-CM | POA: Insufficient documentation

## 2015-06-30 DIAGNOSIS — Z90710 Acquired absence of both cervix and uterus: Secondary | ICD-10-CM | POA: Insufficient documentation

## 2015-06-30 DIAGNOSIS — Z8739 Personal history of other diseases of the musculoskeletal system and connective tissue: Secondary | ICD-10-CM | POA: Diagnosis not present

## 2015-06-30 DIAGNOSIS — G43909 Migraine, unspecified, not intractable, without status migrainosus: Secondary | ICD-10-CM | POA: Insufficient documentation

## 2015-06-30 DIAGNOSIS — N390 Urinary tract infection, site not specified: Secondary | ICD-10-CM | POA: Diagnosis not present

## 2015-06-30 DIAGNOSIS — R109 Unspecified abdominal pain: Secondary | ICD-10-CM

## 2015-06-30 DIAGNOSIS — Z853 Personal history of malignant neoplasm of breast: Secondary | ICD-10-CM | POA: Diagnosis not present

## 2015-06-30 DIAGNOSIS — F17210 Nicotine dependence, cigarettes, uncomplicated: Secondary | ICD-10-CM | POA: Insufficient documentation

## 2015-06-30 DIAGNOSIS — I10 Essential (primary) hypertension: Secondary | ICD-10-CM | POA: Insufficient documentation

## 2015-06-30 DIAGNOSIS — R1030 Lower abdominal pain, unspecified: Secondary | ICD-10-CM | POA: Diagnosis present

## 2015-06-30 DIAGNOSIS — Z8541 Personal history of malignant neoplasm of cervix uteri: Secondary | ICD-10-CM | POA: Diagnosis not present

## 2015-06-30 DIAGNOSIS — Z79899 Other long term (current) drug therapy: Secondary | ICD-10-CM | POA: Diagnosis not present

## 2015-06-30 DIAGNOSIS — Z8719 Personal history of other diseases of the digestive system: Secondary | ICD-10-CM | POA: Insufficient documentation

## 2015-06-30 DIAGNOSIS — Z9049 Acquired absence of other specified parts of digestive tract: Secondary | ICD-10-CM | POA: Insufficient documentation

## 2015-06-30 LAB — CBC
HCT: 39.5 % (ref 36.0–46.0)
Hemoglobin: 13 g/dL (ref 12.0–15.0)
MCH: 33.3 pg (ref 26.0–34.0)
MCHC: 32.9 g/dL (ref 30.0–36.0)
MCV: 101.3 fL — AB (ref 78.0–100.0)
PLATELETS: 367 10*3/uL (ref 150–400)
RBC: 3.9 MIL/uL (ref 3.87–5.11)
RDW: 12.6 % (ref 11.5–15.5)
WBC: 7 10*3/uL (ref 4.0–10.5)

## 2015-06-30 LAB — BASIC METABOLIC PANEL
ANION GAP: 9 (ref 5–15)
BUN: 22 mg/dL — AB (ref 6–20)
CALCIUM: 9 mg/dL (ref 8.9–10.3)
CO2: 22 mmol/L (ref 22–32)
CREATININE: 0.63 mg/dL (ref 0.44–1.00)
Chloride: 108 mmol/L (ref 101–111)
GFR calc Af Amer: >60 mL/min (ref >60)
GLUCOSE: 87 mg/dL (ref 65–99)
Potassium: 3.9 mmol/L (ref 3.5–5.1)
Sodium: 139 mmol/L (ref 135–145)

## 2015-06-30 LAB — URINALYSIS, ROUTINE W REFLEX MICROSCOPIC
Bilirubin Urine: NEGATIVE
GLUCOSE, UA: NEGATIVE mg/dL
KETONES UR: NEGATIVE mg/dL
Nitrite: NEGATIVE
PH: 6.5 (ref 5.0–8.0)
Protein, ur: 100 mg/dL — AB
Specific Gravity, Urine: 1.039 — ABNORMAL HIGH (ref 1.005–1.030)

## 2015-06-30 LAB — URINE MICROSCOPIC-ADD ON

## 2015-06-30 LAB — POC URINE PREG, ED: Preg Test, Ur: NEGATIVE

## 2015-06-30 MED ORDER — OXYCODONE-ACETAMINOPHEN 5-325 MG PO TABS: 1.0000 | ORAL_TABLET | Freq: Four times a day (QID) | ORAL | Status: DC | PRN

## 2015-06-30 MED ORDER — CEPHALEXIN 500 MG PO CAPS: 500.0000 mg | ORAL_CAPSULE | Freq: Four times a day (QID) | ORAL | Status: DC

## 2015-06-30 MED ORDER — OXYCODONE-ACETAMINOPHEN 5-325 MG PO TABS
1.0000 | ORAL_TABLET | Freq: Once | ORAL | Status: AC
  Administered 2015-06-30: 1 via ORAL
  Filled 2015-06-30: qty 1

## 2015-06-30 NOTE — ED Provider Notes (Signed)
CSN: [id number]     Arrival date & time 06/30/15  1750 History   First MD Initiated Contact with Patient 06/30/15 1922     Chief Complaint  Patient presents with  . Flank Pain  . Dysuria   (Consider location/radiation/quality/duration/timing/severity/associated sxs/prior Treatment) HPI 35 y.o. female with a hx of HTN, presents to the Emergency Department today complaining of L flank pain for the past 2 days. Describes the pain as a sore pressure that is a 5/10. States that when she urinates, the pain is a 10/10 and feels like razor blades when she urinates. No hematuria. Notes mild pressure supra pubically as well. No CP/SOB. She was previously admitted in [place] in February 2016 for kidney stones and had stents placed in both kidneys. She has no fever currently. No other symptoms noted.    Past Medical History  Diagnosis Date  . Sciatica   . Hypertension   . Breast cancer ([place])     left lumpectomy 2006  . Migraines   . Breast CA ([place])   . Uterine cancer ([place])   . Stroke [facility]) 2013  . Diverticulitis    Past Surgical History  Procedure Laterality Date  . Abdominal hysterectomy    . Breast lumpectomy Left   . Carpal tunnel release Right   . Cholecystectomy     History reviewed. No pertinent family history. Social History  Substance Use Topics  . Smoking status: Current Every Day Smoker -- 0.50 packs/day for 20 years    Types: Cigarettes  . Smokeless tobacco: Never Used  . Alcohol Use: Yes     Comment: rare   OB History    No data available     Review of Systems ROS reviewed and all are negative for acute change except as noted in the HPI.  Allergies  Reglan; Toradol; and Zofran  Home Medications   Prior to Admission medications   Medication Sig Start Date End Date Taking? Authorizing Provider  Aspirin-Acetaminophen-Caffeine (EXCEDRIN PO) Take 2 tablets by mouth daily as needed (general pain).    Historical Provider, MD  carvedilol (COREG) 6.25 MG tablet Take 1  tablet (6.25 mg total) by mouth 2 (two) times daily with a meal. 10/18/14   Billy Fischer, MD  diclofenac (CATAFLAM) 50 MG tablet Take 1 tablet (50 mg total) by mouth 3 (three) times daily. One tablet TID with food prn pain. Patient not taking: Reported on 06/09/2015 01/14/15   Janne Napoleon, NP  HYDROcodone-acetaminophen (NORCO) 5-325 MG tablet Take 1-2 tablets by mouth every 6 (six) hours as needed for severe pain. 06/09/15   Orlie Dakin, MD  lidocaine (XYLOCAINE) 5 % ointment Apply 1 application topically as needed. Patient not taking: Reported on 06/09/2015 02/08/15   [name], PA-C  lisinopril-hydrochlorothiazide (PRINZIDE,ZESTORETIC) 20-12.5 MG per tablet Take 1 tablet by mouth daily. 10/18/14   Billy Fischer, MD  loratadine (CLARITIN) 10 MG tablet Take 1 tablet (10 mg total) by mouth daily. Patient not taking: Reported on 06/09/2015 09/05/14   Clayton Bibles, PA-C  promethazine (PHENERGAN) 50 MG tablet Take 0.5 tablets (25 mg total) by mouth every 6 (six) hours as needed for nausea or vomiting. 06/09/15   Orlie Dakin, MD  traMADol (ULTRAM) 50 MG tablet Take 1 tablet (50 mg total) by mouth every 6 (six) hours as needed. Patient not taking: Reported on 12/28/2014 11/16/14   Virgel Manifold, MD   BP 130/97 mmHg  Pulse 89  Temp(Src) 98.1 F (36.7 C) (Oral)  Resp 18  SpO2 97%   Physical Exam  Constitutional: She is oriented to person, place, and time. She appears well-developed and well-nourished.  HENT:  Head: Normocephalic and atraumatic.  Eyes: EOM are normal.  Neck: Normal range of motion.  Cardiovascular: Normal rate, regular rhythm and normal heart sounds.   Pulmonary/Chest: Effort normal and breath sounds normal.  Abdominal: Soft. Normal appearance and bowel sounds are normal. There is no tenderness. There is CVA tenderness. There is no rebound, no tenderness at McBurney's point and negative Murphy's sign.  Musculoskeletal: Normal range of motion.       Lumbar back: Normal.  Neurological:  She is alert and oriented to person, place, and time.  Skin: Skin is warm and dry.  Psychiatric: She has a normal mood and affect. Her behavior is normal. Thought content normal.  Nursing note and vitals reviewed.  ED Course  Procedures (including critical care time) Labs Review Labs Reviewed  URINALYSIS, ROUTINE W REFLEX MICROSCOPIC (NOT AT [facility]) - Abnormal; Notable for the following:    APPearance TURBID (*)    Specific Gravity, Urine 1.039 (*)    Hgb urine dipstick LARGE (*)    Protein, ur 100 (*)    Leukocytes, UA LARGE (*)    All other components within normal limits  CBC - Abnormal; Notable for the following:    MCV 101.3 (*)    All other components within normal limits  BASIC METABOLIC PANEL - Abnormal; Notable for the following:    BUN 22 (*)    All other components within normal limits  URINE MICROSCOPIC-ADD ON - Abnormal; Notable for the following:    Squamous Epithelial / LPF 6-30 (*)    Bacteria, UA MANY (*)    All other components within normal limits  POC URINE PREG, ED   Imaging Review Ct Renal Stone Study  06/30/2015  CLINICAL DATA:  Dysuria and left flank pain for 2 days. History of urinary tract stones. Initial encounter. EXAM: CT ABDOMEN AND PELVIS WITHOUT CONTRAST TECHNIQUE: Multidetector CT imaging of the abdomen and pelvis was performed following the standard protocol without IV contrast. COMPARISON:  CT abdomen and pelvis 06/09/2015. FINDINGS: The lung bases are clear.  No pleural or pericardial effusion. A punctate nonobstructing stone is identified in the lower pole of the left kidney. No other urinary tract stones are identified. The kidneys are otherwise unremarkable. The gallbladder has been removed. The liver, spleen, pancreas and adrenal glands appear normal. The patient is status post hysterectomy. The stomach, small and large bowel and appendix appear normal. No lymphadenopathy or fluid. No focal bony abnormality is identified. IMPRESSION: Single punctate  nonobstructing stone lower pole left kidney. Negative for hydronephrosis or ureteral stone. No acute abnormality or finding to explain the patient's symptoms. Electronically Signed   By: Inge Rise M.D.   On: 06/30/2015 20:33   I have personally reviewed and evaluated these images and lab results as part of my medical decision-making.   EKG Interpretation None      MDM  I have reviewed relevant laboratory values. I have reviewed relevant imaging studies.  I have reviewed the relevant previous healthcare records. I obtained HPI from historian. Patient discussed with supervising physician  ED Course:  Assessment: 65y F with hx of previous admission for nephrolithiasis with stents placed in feb 2016 presents with L flank pain x2 days. Has associated dysuria. Afebrile. VSS. CT Renal Study showed non obstructing stone in L pole of kidney. No hydronephrosis or ureteral stone. UA showed Leukocytes/Hgb/bacteria.  Other labs otherwise unremarkable. Will treat as UTI w/ keflex and analgesia and have pt follow up outpatient for further management.  Patient is in no acute distress. Vital Signs are stable. Patient is able to ambulate. Patient able to tolerate PO.       Disposition/Plan:  DC Home Additional Verbal discharge instructions given and discussed with patient.  Pt Instructed to f/u with PCP in the next 48 hours for evaluation and treatment of symptoms. Return precautions given Pt acknowledges and agrees with plan   Supervising Physician Fredia Sorrow, MD    Final diagnoses:  Left flank pain  UTI (lower urinary tract infection)      Shary Decamp, PA-C 06/30/15 2108

## 2015-06-30 NOTE — ED Notes (Signed)
Pt husband at bedside, pt eating food upon entering the room.  Pt appears in no acute distress.

## 2015-06-30 NOTE — ED Provider Notes (Signed)
Medical screening examination/treatment/procedure(s) were conducted as a shared visit with non-physician practitioner(s) and myself.  I personally evaluated the patient during the encounter.   EKG Interpretation None      Results for orders placed or performed during the hospital encounter of 06/30/15  Urinalysis, Routine w reflex microscopic-may I&O cath if menses (not at [facility])  Result Value Ref Range   Color, Urine YELLOW YELLOW   APPearance TURBID (A) CLEAR   Specific Gravity, Urine 1.039 (H) 1.005 - 1.030   pH 6.5 5.0 - 8.0   Glucose, UA NEGATIVE NEGATIVE mg/dL   Hgb urine dipstick LARGE (A) NEGATIVE   Bilirubin Urine NEGATIVE NEGATIVE   Ketones, ur NEGATIVE NEGATIVE mg/dL   Protein, ur 100 (A) NEGATIVE mg/dL   Nitrite NEGATIVE NEGATIVE   Leukocytes, UA LARGE (A) NEGATIVE  CBC  Result Value Ref Range   WBC 7.0 4.0 - 10.5 K/uL   RBC 3.90 3.87 - 5.11 MIL/uL   Hemoglobin 13.0 12.0 - 15.0 g/dL   HCT 39.5 36.0 - 46.0 %   MCV 101.3 (H) 78.0 - 100.0 fL   MCH 33.3 26.0 - 34.0 pg   MCHC 32.9 30.0 - 36.0 g/dL   RDW 12.6 11.5 - 15.5 %   Platelets 367 150 - 400 K/uL  Basic metabolic panel  Result Value Ref Range   Sodium 139 135 - 145 mmol/L   Potassium 3.9 3.5 - 5.1 mmol/L   Chloride 108 101 - 111 mmol/L   CO2 22 22 - 32 mmol/L   Glucose, Bld 87 65 - 99 mg/dL   BUN 22 (H) 6 - 20 mg/dL   Creatinine, Ser 0.63 0.44 - 1.00 mg/dL   Calcium 9.0 8.9 - 10.3 mg/dL   GFR calc non Af Amer >60 >60 mL/min   GFR calc Af Amer >60 >60 mL/min   Anion gap 9 5 - 15  Urine microscopic-add on  Result Value Ref Range   Squamous Epithelial / LPF 6-30 (A) NONE SEEN   WBC, UA 6-30 0 - 5 WBC/hpf   RBC / HPF 6-30 0 - 5 RBC/hpf   Bacteria, UA MANY (A) NONE SEEN   Urine-Other LESS THAN 10 mL OF URINE SUBMITTED   POC urine preg, ED (not at [facility])  Result Value Ref Range   Preg Test, Ur NEGATIVE NEGATIVE   Ct Abdomen Pelvis W Contrast  06/09/2015  CLINICAL DATA:  Left upper and left lower quadrant  pain and tenderness. Symptoms began 2 days ago. History of diverticulitis, breast cancer, and uterine cancer. EXAM: CT ABDOMEN AND PELVIS WITH CONTRAST TECHNIQUE: Multidetector CT imaging of the abdomen and pelvis was performed using the standard protocol following bolus administration of intravenous contrast. CONTRAST:  100 mL Omnipaque 300 IV COMPARISON:  12/09/2014 FINDINGS: Subsegmental atelectasis is noted in the lung bases. The gallbladder is surgically absent. Mild intrahepatic and extrahepatic biliary dilatation is unchanged, likely related to prior cholecystectomy. No focal liver lesion is identified. The spleen, adrenal glands, and kidneys are unremarkable. A small calcification in the uncinate process of the pancreas is unchanged. There is also minimal prominence of the pancreatic duct in the head and body which is similar to the prior study. There is likely a small sliding hiatal hernia. Oral contrast is present in multiple nondilated loops of proximal and mid small bowel without evidence of bowel obstruction. Mild prominence of the wall of the descending and sigmoid colon is likely due to underdistention without surrounding inflammatory change. Scattered colonic diverticula are noted  without evidence of diverticulitis. Appendix is unremarkable. No free fluid or enlarged lymph nodes are identified. The uterus is absent. Ovaries are unremarkable. Bladder is unremarkable. No acute osseous abnormality is identified. Abdominal aorta is normal in caliber. IMPRESSION: No acute abnormality identified in the abdomen or pelvis. Underdistention of the left colon without surrounding inflammatory change. Electronically Signed   By: Logan Bores M.D.   On: 06/09/2015 10:05   Ct Renal Stone Study  06/30/2015  CLINICAL DATA:  Dysuria and left flank pain for 2 days. History of urinary tract stones. Initial encounter. EXAM: CT ABDOMEN AND PELVIS WITHOUT CONTRAST TECHNIQUE: Multidetector CT imaging of the abdomen and  pelvis was performed following the standard protocol without IV contrast. COMPARISON:  CT abdomen and pelvis 06/09/2015. FINDINGS: The lung bases are clear.  No pleural or pericardial effusion. A punctate nonobstructing stone is identified in the lower pole of the left kidney. No other urinary tract stones are identified. The kidneys are otherwise unremarkable. The gallbladder has been removed. The liver, spleen, pancreas and adrenal glands appear normal. The patient is status post hysterectomy. The stomach, small and large bowel and appendix appear normal. No lymphadenopathy or fluid. No focal bony abnormality is identified. IMPRESSION: Single punctate nonobstructing stone lower pole left kidney. Negative for hydronephrosis or ureteral stone. No acute abnormality or finding to explain the patient's symptoms. Electronically Signed   By: Inge Rise M.D.   On: 06/30/2015 20:33    Medical screening examination/treatment/procedure(s) were conducted as a shared visit with non-physician practitioner(s) and myself.  I personally evaluated the patient during the encounter.   EKG Interpretation None      Patient seen by me. Patient with a 2 day history of left flank pain. Patient states in the past she's had stents in her kidneys and his had a history of kidney stones before. That was last February in [place]. Patient the abdomen is soft and nontender. No flank tenderness no back tenderness. CT of stone study shows evidence of nonobstructing kidney calculi no ureteral stones. No other acute abdominal abnormalities. Patient's labs without significant abnormalities. Other than the urinalysis which raises question for early urinary tract infection but certainly nothing consistent with pyelonephritis. We'll send urine culture and treat her with Keflex. And give her a course of pain medicine for the flank pain  Fredia Sorrow, MD 06/30/15 2105

## 2015-06-30 NOTE — ED Notes (Signed)
Provider at bedside for pt eval.

## 2015-06-30 NOTE — ED Notes (Addendum)
Pt states she has had dysuria and L flank pain x 2 days. Had stents and kidney surgery last Feb in [place]. Alert and oriented.

## 2015-06-30 NOTE — Discharge Instructions (Signed)
Please read and follow all provided instructions.  Your diagnoses today include:  1. UTI (lower urinary tract infection)   2. Left flank pain    Tests performed today include:  Urine test - suggests that you have an infection in your bladder  Vital signs. See below for your results today.   Medications prescribed:   Keflex. Take as Prescribed.   Home care instructions:  Follow any educational materials contained in this packet.  Follow-up instructions: Please follow-up with your primary care provider in 3 days if symptoms are not resolved for further evaluation of your symptoms.  Return instructions:   Please return to the Emergency Department if you experience worsening symptoms.   Return with fever, worsening pain, persistent vomiting, worsening pain in your back.   Please return if you have any other emergent concerns.  Additional Information:  Your vital signs today were: BP 130/97 mmHg   Pulse 89   Temp(Src) 98.1 F (36.7 C) (Oral)   Resp 18   SpO2 97%   LMP  If your blood pressure (BP) was elevated above 135/85 this visit, please have this repeated by your doctor within one month. --------------

## 2015-06-30 NOTE — ED Notes (Signed)
Patient transported to CT 

## 2015-07-02 LAB — URINE CULTURE

## 2015-07-14 MED FILL — SUBOXONE 8 MG-2 MG SL FILM: 8-2 | 16 days supply | Qty: 40 | Fill #0

## 2015-07-30 MED FILL — SUBOXONE 8 MG-2 MG SL FILM: 8-2 | 30 days supply | Qty: 76 | Fill #0

## 2015-08-02 ENCOUNTER — Emergency Department (HOSPITAL_COMMUNITY)
Admission: EM | Admit: 2015-08-02 | Discharge: 2015-08-02 | Disposition: A | Discharge status: Home / Self Care | Payer: Medicaid Other | Attending: Emergency Medicine | Admitting: Emergency Medicine

## 2015-08-02 ENCOUNTER — Encounter (HOSPITAL_COMMUNITY): Payer: Self-pay | Admitting: *Deleted

## 2015-08-02 ENCOUNTER — Emergency Department (HOSPITAL_COMMUNITY): Payer: Medicaid Other

## 2015-08-02 DIAGNOSIS — F17210 Nicotine dependence, cigarettes, uncomplicated: Secondary | ICD-10-CM | POA: Diagnosis not present

## 2015-08-02 DIAGNOSIS — Z8739 Personal history of other diseases of the musculoskeletal system and connective tissue: Secondary | ICD-10-CM | POA: Insufficient documentation

## 2015-08-02 DIAGNOSIS — Z8673 Personal history of transient ischemic attack (TIA), and cerebral infarction without residual deficits: Secondary | ICD-10-CM | POA: Diagnosis not present

## 2015-08-02 DIAGNOSIS — M25572 Pain in left ankle and joints of left foot: Secondary | ICD-10-CM

## 2015-08-02 DIAGNOSIS — Y9289 Other specified places as the place of occurrence of the external cause: Secondary | ICD-10-CM | POA: Insufficient documentation

## 2015-08-02 DIAGNOSIS — Y998 Other external cause status: Secondary | ICD-10-CM | POA: Insufficient documentation

## 2015-08-02 DIAGNOSIS — Z8719 Personal history of other diseases of the digestive system: Secondary | ICD-10-CM | POA: Insufficient documentation

## 2015-08-02 DIAGNOSIS — Z8541 Personal history of malignant neoplasm of cervix uteri: Secondary | ICD-10-CM | POA: Diagnosis not present

## 2015-08-02 DIAGNOSIS — Z853 Personal history of malignant neoplasm of breast: Secondary | ICD-10-CM | POA: Insufficient documentation

## 2015-08-02 DIAGNOSIS — Z79899 Other long term (current) drug therapy: Secondary | ICD-10-CM | POA: Diagnosis not present

## 2015-08-02 DIAGNOSIS — Y93K1 Activity, walking an animal: Secondary | ICD-10-CM | POA: Diagnosis not present

## 2015-08-02 DIAGNOSIS — S99912A Unspecified injury of left ankle, initial encounter: Secondary | ICD-10-CM | POA: Diagnosis not present

## 2015-08-02 DIAGNOSIS — Z792 Long term (current) use of antibiotics: Secondary | ICD-10-CM | POA: Insufficient documentation

## 2015-08-02 DIAGNOSIS — W1842XA Slipping, tripping and stumbling without falling due to stepping into hole or opening, initial encounter: Secondary | ICD-10-CM | POA: Diagnosis not present

## 2015-08-02 DIAGNOSIS — I10 Essential (primary) hypertension: Secondary | ICD-10-CM | POA: Diagnosis not present

## 2015-08-02 MED ORDER — TRAMADOL-ACETAMINOPHEN 37.5-325 MG PO TABS: 1.0000 | ORAL_TABLET | Freq: Four times a day (QID) | ORAL | Status: DC | PRN

## 2015-08-02 NOTE — Progress Notes (Signed)
Orthopedic Tech Progress Note Patient Details:  Felicia Moore [birth year] [id number]  Ortho Devices Type of Ortho Device: ASO, Crutches Ortho Device/Splint Location: LLE Ortho Device/Splint Interventions: Ordered, Application   Braulio Bosch 08/02/2015, 3:57 PM

## 2015-08-02 NOTE — ED Notes (Signed)
Pt reports falling and twisting left ankle on Saturday and now has pain and swelling.able to move digits.

## 2015-08-02 NOTE — ED Provider Notes (Signed)
CSN: [id number]     Arrival date & time 08/02/15  1316 History  By signing my name below, I, Essence Howell, attest that this documentation has been prepared under the direction and in the presence of Quincy Carnes, PA-C Electronically Signed: Ladene Artist, ED Scribe 08/02/2015 at 3:50 PM.   Chief Complaint  Patient presents with  . Ankle Pain   The history is provided by the patient. No language interpreter was used.   HPI Comments: Felicia Moore is a 35 y.o. female who presents to the Emergency Department complaining of constant left ankle pain onset 2 days ago. Pt states that she was walking her dog when she stepped in a hole and twisted her ankle. She reports constant pain that is exacerbated with ambulating, moving her toes and palpation. Pt reports associated swelling to the area. She has tried an ace wrap without significant relief.  No numbness/weakness of left leg/foot.  VSS.  Past Medical History  Diagnosis Date  . Sciatica   . Hypertension   . Breast cancer ([place])     left lumpectomy 2006  . Migraines   . Breast CA ([place])   . Uterine cancer ([place])   . Stroke [facility]) 2013  . Diverticulitis    Past Surgical History  Procedure Laterality Date  . Abdominal hysterectomy    . Breast lumpectomy Left   . Carpal tunnel release Right   . Cholecystectomy     History reviewed. No pertinent family history. Social History  Substance Use Topics  . Smoking status: Current Every Day Smoker -- 0.50 packs/day for 20 years    Types: Cigarettes  . Smokeless tobacco: Never Used  . Alcohol Use: Yes     Comment: rare   OB History    No data available     Review of Systems  Musculoskeletal: Positive for joint swelling and arthralgias.  All other systems reviewed and are negative.  Allergies  Reglan; Toradol; and Zofran  Home Medications   Prior to Admission medications   Medication Sig Start Date End Date Taking? Authorizing Provider  carvedilol (COREG) 6.25 MG tablet Take 1  tablet (6.25 mg total) by mouth 2 (two) times daily with a meal. 10/18/14   Billy Fischer, MD  cephALEXin (KEFLEX) 500 MG capsule Take 1 capsule (500 mg total) by mouth 4 (four) times daily. 06/30/15   Shary Decamp, PA-C  diclofenac (CATAFLAM) 50 MG tablet Take 1 tablet (50 mg total) by mouth 3 (three) times daily. One tablet TID with food prn pain. Patient not taking: Reported on 06/09/2015 01/14/15   Janne Napoleon, NP  HYDROcodone-acetaminophen (NORCO) 5-325 MG tablet Take 1-2 tablets by mouth every 6 (six) hours as needed for severe pain. Patient not taking: Reported on 06/30/2015 06/09/15   Orlie Dakin, MD  lidocaine (XYLOCAINE) 5 % ointment Apply 1 application topically as needed. Patient not taking: Reported on 06/09/2015 02/08/15   [name], PA-C  lisinopril-hydrochlorothiazide (PRINZIDE,ZESTORETIC) 20-12.5 MG per tablet Take 1 tablet by mouth daily. 10/18/14   Billy Fischer, MD  loratadine (CLARITIN) 10 MG tablet Take 1 tablet (10 mg total) by mouth daily. Patient not taking: Reported on 06/09/2015 09/05/14   Clayton Bibles, PA-C  oxyCODONE-acetaminophen (PERCOCET/ROXICET) 5-325 MG tablet Take 1 tablet by mouth every 6 (six) hours as needed for severe pain. 06/30/15   Shary Decamp, PA-C  promethazine (PHENERGAN) 50 MG tablet Take 0.5 tablets (25 mg total) by mouth every 6 (six) hours as needed for nausea or vomiting. Patient not  taking: Reported on 06/30/2015 06/09/15   Orlie Dakin, MD  traMADol (ULTRAM) 50 MG tablet Take 1 tablet (50 mg total) by mouth every 6 (six) hours as needed. Patient not taking: Reported on 12/28/2014 11/16/14   Virgel Manifold, MD   BP 127/81 mmHg  Pulse 74  Temp(Src) 97.7 F (36.5 C) (Oral)  Resp 18  SpO2 98% Physical Exam  Constitutional: She is oriented to person, place, and time. She appears well-developed and well-nourished.  HENT:  Head: Normocephalic and atraumatic.  Mouth/Throat: Oropharynx is clear and moist.  Eyes: Conjunctivae and EOM are normal. Pupils are equal,  round, and reactive to light.  Neck: Normal range of motion.  Cardiovascular: Normal rate, regular rhythm and normal heart sounds.   Pulmonary/Chest: Effort normal and breath sounds normal.  Abdominal: Soft. Bowel sounds are normal.  Musculoskeletal: Normal range of motion.  Left lateral malleolus with swelling and bruising noted, slightly limited ROM due to pain/swelling; no acute deformities noted; DP pulse intact; foot warm, well perfused; compartments soft  Neurological: She is alert and oriented to person, place, and time.  Skin: Skin is warm and dry.  Psychiatric: She has a normal mood and affect.  Nursing note and vitals reviewed.  ED Course  ORTHOPEDIC INJURY TREATMENT Date/Time: 08/02/2015 4:11 PM Performed by: Larene Pickett Authorized by: Larene Pickett Consent: Verbal consent obtained. Risks and benefits: risks, benefits and alternatives were discussed Consent given by: patient Patient understanding: patient states understanding of the procedure being performed Required items: required blood products, implants, devices, and special equipment available Patient identity confirmed: verbally with patient Injury location: ankle Location details: left ankle Injury type: soft tissue Pre-procedure neurovascular assessment: neurovascularly intact Immobilization: splint Supplies used: aluminum splint Post-procedure neurovascular assessment: post-procedure neurovascularly intact Patient tolerance: Patient tolerated the procedure well with no immediate complications   (including critical care time) DIAGNOSTIC STUDIES: Oxygen Saturation is 98% on RA, normal by my interpretation.    COORDINATION OF CARE: 3:47 PM-Discussed treatment plan which includes XR, ankle brace and f/u with ortho with pt at bedside and pt agreed to plan.   Labs Review Labs Reviewed - No data to display  Imaging Review Dg Ankle Complete Left  08/02/2015  CLINICAL DATA:  Pain in medial malleolus after  falling. History of ankle fracture. EXAM: LEFT ANKLE COMPLETE - 3+ VIEW COMPARISON:  None. FINDINGS: Small bone fragment or ossification along the distal fibula appears chronic. Mild soft tissue prominence in the ankle. The left ankle is located without evidence for an acute fracture. Alignment of the ankle is normal. IMPRESSION: No acute abnormality in the left ankle. Electronically Signed   By: Markus Daft M.D.   On: 08/02/2015 15:40   I have personally reviewed and evaluated these images and lab results as part of my medical decision-making.   EKG Interpretation None      MDM   Final diagnoses:  Left ankle pain   35 year old female here with left ankle injury. Stepped in a hole while walking her dog last night.  No head injury or loss of consciousness.  Swelling and bruising noted to lateral left ankle.  No deformities on exam.  Foot is neurovascularly intact.  X-ray negative for acute bony findings.  Suspect sprain type injury.  Patient placed in ASO brace, given crutches.  Rx ultracet.  Encouraged RICE routine at home.  Given orthopedic follow-up if no improvement or if symptoms worsening.  Discussed plan with patient, he/she acknowledged understanding and agreed with plan of  care.  Return precautions given for new or worsening symptoms.  I personally performed the services described in this documentation, which was scribed in my presence. The recorded information has been reviewed and is accurate.  Larene Pickett, PA-C 08/02/15 [place], MD 08/06/15 Shelah Lewandowsky

## 2015-08-02 NOTE — ED Notes (Signed)
Patient transported to X-ray 

## 2015-08-02 NOTE — ED Notes (Signed)
Pt is in stable condition upon d/c and is escorted from ED via wheelchair. 

## 2015-08-02 NOTE — Discharge Instructions (Signed)
Take the prescribed medication as directed. May wish to ice and elevate foot at home to help with pain/swelling. Start non-weight bearing and progress back to full weight bearing on left foot as tolerated. Follow-up with orthopedics if no improvement or symptoms worsening over the next week. Return to the ED for new concerns.

## 2015-08-26 MED FILL — SUBOXONE 8 MG-2 MG SL FILM: 8-2 | 30 days supply | Qty: 75 | Fill #0

## 2015-09-21 MED FILL — SUBOXONE 8 MG-2 MG SL FILM: 8-2 | 30 days supply | Qty: 75 | Fill #0

## 2015-10-21 MED FILL — SUBOXONE 8 MG-2 MG SL FILM: 8-2 | 7 days supply | Qty: 18 | Fill #0

## 2015-10-28 MED FILL — SUBOXONE 8 MG-2 MG SL FILM: 8-2 | 7 days supply | Qty: 18 | Fill #0

## 2015-11-08 MED FILL — traMADol HCL 50 MG TABS: 50 | 15 days supply | Qty: 60 | Fill #0 | Status: TO

## 2015-12-27 ENCOUNTER — Emergency Department (HOSPITAL_COMMUNITY): Payer: Medicaid Other

## 2015-12-27 ENCOUNTER — Encounter (HOSPITAL_COMMUNITY): Payer: Self-pay | Admitting: Emergency Medicine

## 2015-12-27 ENCOUNTER — Emergency Department (HOSPITAL_COMMUNITY)
Admission: EM | Admit: 2015-12-27 | Discharge: 2015-12-27 | Disposition: A | Discharge status: Home / Self Care | Payer: Medicaid Other | Attending: Emergency Medicine | Admitting: Emergency Medicine

## 2015-12-27 DIAGNOSIS — Z792 Long term (current) use of antibiotics: Secondary | ICD-10-CM | POA: Insufficient documentation

## 2015-12-27 DIAGNOSIS — Y939 Activity, unspecified: Secondary | ICD-10-CM | POA: Insufficient documentation

## 2015-12-27 DIAGNOSIS — Y999 Unspecified external cause status: Secondary | ICD-10-CM | POA: Insufficient documentation

## 2015-12-27 DIAGNOSIS — W010XXA Fall on same level from slipping, tripping and stumbling without subsequent striking against object, initial encounter: Secondary | ICD-10-CM | POA: Diagnosis not present

## 2015-12-27 DIAGNOSIS — Z853 Personal history of malignant neoplasm of breast: Secondary | ICD-10-CM | POA: Insufficient documentation

## 2015-12-27 DIAGNOSIS — Z8673 Personal history of transient ischemic attack (TIA), and cerebral infarction without residual deficits: Secondary | ICD-10-CM | POA: Insufficient documentation

## 2015-12-27 DIAGNOSIS — I10 Essential (primary) hypertension: Secondary | ICD-10-CM | POA: Insufficient documentation

## 2015-12-27 DIAGNOSIS — Z79899 Other long term (current) drug therapy: Secondary | ICD-10-CM | POA: Insufficient documentation

## 2015-12-27 DIAGNOSIS — Z8542 Personal history of malignant neoplasm of other parts of uterus: Secondary | ICD-10-CM | POA: Diagnosis not present

## 2015-12-27 DIAGNOSIS — M25562 Pain in left knee: Secondary | ICD-10-CM | POA: Diagnosis present

## 2015-12-27 DIAGNOSIS — Y92000 Kitchen of unspecified non-institutional (private) residence as  the place of occurrence of the external cause: Secondary | ICD-10-CM | POA: Diagnosis not present

## 2015-12-27 DIAGNOSIS — F17210 Nicotine dependence, cigarettes, uncomplicated: Secondary | ICD-10-CM | POA: Insufficient documentation

## 2015-12-27 MED ORDER — OXYCODONE HCL 5 MG PO TABS: 5.0000 mg | ORAL_TABLET | Freq: Four times a day (QID) | ORAL | 0 refills | Status: DC | PRN

## 2015-12-27 MED ORDER — TRAMADOL HCL 50 MG PO TABS: 50.0000 mg | ORAL_TABLET | Freq: Four times a day (QID) | ORAL | 0 refills | Status: DC | PRN

## 2015-12-27 NOTE — Discharge Instructions (Signed)
Please read and follow all provided instructions.  Your diagnoses today include:  1. Left knee pain    Tests performed today include: Vital signs. See below for your results today.   Medications prescribed:  Take as prescribed   You have been prescribed a narcotic medication on an "as needed" basis. Take only as prescribed. Do not drive, operate any machinery or make any important decisions while taking this medication as it is sedating. It may cause constipation take over the counter stool softeners or add fiber to your diet to treat this (Metamucil, Psyllium Fiber, Colace, Miralax) Further refills will need to be obtained from your primary care doctor and will not be prescribed through the Emergency Department. You will test positive on most drug tests while taking this medciation.   You can use Ibuprofen 400mg  combined with Tylenol 1000mg  for pain relief every 6 hours. Do not exceed 4g of Tylenol in one 24 hour period. Use narcotics if pain uncontrolled with the aforementioned regiment. Do not exceed 10 days of this treatment  Home care instructions:  Follow any educational materials contained in this packet.  Follow-up instructions: Please follow-up with Orthopedic provider for further evaluation of symptoms and treatment   Return instructions:  Please return to the Emergency Department if you do not get better, if you get worse, or new symptoms OR  - Fever (temperature greater than 101.66F)  - Bleeding that does not stop with holding pressure to the area    -Severe pain (please note that you may be more sore the day after your accident)  - Chest Pain  - Difficulty breathing  - Severe nausea or vomiting  - Inability to tolerate food and liquids  - Passing out  - Skin becoming red around your wounds  - Change in mental status (confusion or lethargy)  - New numbness or weakness    Please return if you have any other emergent concerns.  Additional Information:  Your vital signs  today were: BP (!) 158/104 (BP Location: Right Arm)    Temp 98 F (36.7 C) (Oral)    Resp 17    SpO2 100%  If your blood pressure (BP) was elevated above 135/85 this visit, please have this repeated by your doctor within one month. ---------------

## 2015-12-27 NOTE — ED Notes (Signed)
Bed: WA27 Expected date:  Expected time:  Means of arrival:  Comments: 

## 2015-12-27 NOTE — ED Notes (Signed)
Signature cap in room not working. Patient verbalizes understanding of discharge instructions.

## 2015-12-27 NOTE — ED Provider Notes (Signed)
[facility] DEPT Provider Note   CSN: [id number] Arrival date & time: 12/27/15  1013  First Provider Contact:  First MD Initiated Contact with Patient 12/27/15 1201     History   Chief Complaint Chief Complaint  Patient presents with  . Knee Pain   HPI Felicia Moore is a 35 y.o. female.  HPI 35 y.o. female presents to the Emergency Department today complaining of left knee pain s/p fall x 1 week ago slipping on water. States pain is 5/10 at rest, but 9/10 with ambulation and weight bearing. Pt attempted to go to work and weight bear, but pain is too much. OTC remedies with minimal relief. No fevers. No other symptoms noted.   Past Medical History:  Diagnosis Date  . Breast CA ([place])   . Breast cancer ([place])    left lumpectomy 2006  . Diverticulitis   . Hypertension   . Migraines   . Sciatica   . Stroke [facility]) 2013  . Uterine cancer [facility])     Patient Active Problem List   Diagnosis Date Noted  . Low back pain 09/28/2014  . Essential hypertension 09/28/2014  . Allergic rhinitis 09/28/2014    Past Surgical History:  Procedure Laterality Date  . ABDOMINAL HYSTERECTOMY    . BREAST LUMPECTOMY Left   . CARPAL TUNNEL RELEASE Right   . CHOLECYSTECTOMY      OB History    No data available     Home Medications    Prior to Admission medications   Medication Sig Start Date End Date Taking? Authorizing Provider  carvedilol (COREG) 6.25 MG tablet Take 1 tablet (6.25 mg total) by mouth 2 (two) times daily with a meal. 10/18/14   Billy Fischer, MD  cephALEXin (KEFLEX) 500 MG capsule Take 1 capsule (500 mg total) by mouth 4 (four) times daily. 06/30/15   Shary Decamp, PA-C  diclofenac (CATAFLAM) 50 MG tablet Take 1 tablet (50 mg total) by mouth 3 (three) times daily. One tablet TID with food prn pain. Patient not taking: Reported on 06/09/2015 01/14/15   Janne Napoleon, NP  HYDROcodone-acetaminophen (NORCO) 5-325 MG tablet Take 1-2 tablets by mouth every 6 (six) hours as needed for  severe pain. Patient not taking: Reported on 06/30/2015 06/09/15   Orlie Dakin, MD  lidocaine (XYLOCAINE) 5 % ointment Apply 1 application topically as needed. Patient not taking: Reported on 06/09/2015 02/08/15   [name], PA-C  lisinopril-hydrochlorothiazide (PRINZIDE,ZESTORETIC) 20-12.5 MG per tablet Take 1 tablet by mouth daily. 10/18/14   Billy Fischer, MD  loratadine (CLARITIN) 10 MG tablet Take 1 tablet (10 mg total) by mouth daily. Patient not taking: Reported on 06/09/2015 09/05/14   Clayton Bibles, PA-C  oxyCODONE-acetaminophen (PERCOCET/ROXICET) 5-325 MG tablet Take 1 tablet by mouth every 6 (six) hours as needed for severe pain. 06/30/15   Shary Decamp, PA-C  promethazine (PHENERGAN) 50 MG tablet Take 0.5 tablets (25 mg total) by mouth every 6 (six) hours as needed for nausea or vomiting. Patient not taking: Reported on 06/30/2015 06/09/15   Orlie Dakin, MD  traMADol (ULTRAM) 50 MG tablet Take 1 tablet (50 mg total) by mouth every 6 (six) hours as needed. Patient not taking: Reported on 12/28/2014 11/16/14   Virgel Manifold, MD  traMADol-acetaminophen (ULTRACET) 37.5-325 MG tablet Take 1 tablet by mouth every 6 (six) hours as needed. 08/02/15   Larene Pickett, PA-C    Family History No family history on file.  Social History Social History  Substance Use Topics  .  Smoking status: Current Every Day Smoker    Packs/day: 0.50    Years: 20.00    Types: Cigarettes  . Smokeless tobacco: Never Used  . Alcohol use Yes     Comment: rare   Allergies   Reglan [metoclopramide]; Toradol [ketorolac tromethamine]; and Zofran [ondansetron hcl]   Review of Systems Review of Systems  Constitutional: Negative for fever.  Musculoskeletal: Positive for arthralgias and joint swelling.  Skin: Negative for wound.   Physical Exam Updated Vital Signs BP (!) 158/104 (BP Location: Right Arm)   Temp 98 F (36.7 C) (Oral)   Resp 17   SpO2 100%   Physical Exam  Constitutional: She is oriented to  person, place, and time. Vital signs are normal. She appears well-developed and well-nourished.  HENT:  Head: Normocephalic.  Right Ear: Hearing normal.  Left Ear: Hearing normal.  Eyes: Conjunctivae and EOM are normal. Pupils are equal, round, and reactive to light.  Cardiovascular: Normal rate and regular rhythm.   Pulmonary/Chest: Effort normal.  Musculoskeletal:       Left knee: She exhibits decreased range of motion and swelling. She exhibits no deformity, no laceration and no erythema. Tenderness found.  Left knee TTP along patella. Free floating fragment on palpation on patella (<1cm). Patella with good traction. Neurovascularly intact. Good distal pulses. Motor/sensory intact.   Neurological: She is alert and oriented to person, place, and time.  Skin: Skin is warm and dry.  Psychiatric: She has a normal mood and affect. Her speech is normal and behavior is normal. Thought content normal.   ED Treatments / Results  Labs (all labs ordered are listed, but only abnormal results are displayed) Labs Reviewed - No data to display  EKG  EKG Interpretation None       Radiology Dg Knee Complete 4 Views Left  Result Date: 12/27/2015 CLINICAL DATA:  35 year old female fell last week landing on knee. Pain worse with motion. Initial encounter. EXAM: LEFT KNEE - COMPLETE 4+ VIEW COMPARISON:  None. FINDINGS: No evidence of fracture, dislocation, or joint effusion. No evidence of arthropathy or other focal bone abnormality. Soft tissues are unremarkable. IMPRESSION: Negative. Electronically Signed   By: Genia Del M.D.   On: 12/27/2015 11:03  Procedures Procedures (including critical care time)  Medications Ordered in ED Medications - No data to display   Initial Impression / Assessment and Plan / ED Course  I have reviewed the triage vital signs and the nursing notes.  Pertinent labs & imaging results that were available during my care of the patient were reviewed by me and  considered in my medical decision making (see chart for details).  Clinical Course   Final Clinical Impressions(s) / ED Diagnoses  I have reviewed and evaluated the relevant imaging studies. I have reviewed the relevant previous healthcare records. I obtained HPI from historian.  ED Course:  Assessment: Pt is a 35yF who presents with left knee pain x 1week. Difficulty with ambulation. On exam, pt in NAD. Nontoxic/nonseptic appearing. VSS. Afebrile. left knee with mild swelling. Palpable fragment on patella. Has good traction on knee. Neurovascularly intact. Imaging shows no acute fracture. Suspect fragment from fall that is not picked up on imaging. Plan is to DC home with follow up to Ortho with knee immobilizer and crutches. Given Rx Tramadol #8. Reviewed [place] drug database. At time of discharge, Patient is in no acute distress. Vital Signs are stable. Patient is able to ambulate. Patient able to tolerate PO.    Disposition/Plan:  DC Home Additional Verbal discharge instructions given and discussed with patient.  Pt Instructed to f/u with Ortho in the next week for evaluation and treatment of symptoms. Return precautions given Pt acknowledges and agrees with plan  Supervising Physician Orlie Dakin, MD   Final diagnoses:  Left knee pain    New Prescriptions New Prescriptions   No medications on file       Shary Decamp, PA-C 12/27/15 [place], MD 12/27/15 [id number]

## 2015-12-27 NOTE — ED Triage Notes (Signed)
Patient states that she fell a week ago on water in her kitchen and landed on her left knee.  Patient has tried icing, resting knee but still very painful when weight bearing or movements involved.  Patient states that 'you can feel a crack in it right here'.

## 2016-01-16 ENCOUNTER — Emergency Department (HOSPITAL_COMMUNITY)
Admission: EM | Admit: 2016-01-16 | Discharge: 2016-01-16 | Disposition: A | Discharge status: Home / Self Care | Payer: Medicaid Other | Attending: Emergency Medicine | Admitting: Emergency Medicine

## 2016-01-16 ENCOUNTER — Encounter (HOSPITAL_COMMUNITY): Payer: Self-pay

## 2016-01-16 DIAGNOSIS — Z79899 Other long term (current) drug therapy: Secondary | ICD-10-CM | POA: Diagnosis not present

## 2016-01-16 DIAGNOSIS — Z853 Personal history of malignant neoplasm of breast: Secondary | ICD-10-CM | POA: Diagnosis not present

## 2016-01-16 DIAGNOSIS — F17210 Nicotine dependence, cigarettes, uncomplicated: Secondary | ICD-10-CM | POA: Diagnosis not present

## 2016-01-16 DIAGNOSIS — Z8673 Personal history of transient ischemic attack (TIA), and cerebral infarction without residual deficits: Secondary | ICD-10-CM | POA: Insufficient documentation

## 2016-01-16 DIAGNOSIS — R000 Tachycardia, unspecified: Secondary | ICD-10-CM | POA: Insufficient documentation

## 2016-01-16 DIAGNOSIS — E860 Dehydration: Secondary | ICD-10-CM

## 2016-01-16 DIAGNOSIS — Z8542 Personal history of malignant neoplasm of other parts of uterus: Secondary | ICD-10-CM | POA: Diagnosis not present

## 2016-01-16 DIAGNOSIS — A059 Bacterial foodborne intoxication, unspecified: Secondary | ICD-10-CM | POA: Diagnosis not present

## 2016-01-16 DIAGNOSIS — R197 Diarrhea, unspecified: Secondary | ICD-10-CM

## 2016-01-16 DIAGNOSIS — A084 Viral intestinal infection, unspecified: Secondary | ICD-10-CM | POA: Insufficient documentation

## 2016-01-16 DIAGNOSIS — R1084 Generalized abdominal pain: Secondary | ICD-10-CM | POA: Diagnosis present

## 2016-01-16 DIAGNOSIS — I10 Essential (primary) hypertension: Secondary | ICD-10-CM | POA: Diagnosis not present

## 2016-01-16 DIAGNOSIS — R109 Unspecified abdominal pain: Secondary | ICD-10-CM

## 2016-01-16 DIAGNOSIS — R112 Nausea with vomiting, unspecified: Secondary | ICD-10-CM

## 2016-01-16 LAB — COMPREHENSIVE METABOLIC PANEL
ALT: 14 U/L (ref 14–54)
AST: 17 U/L (ref 15–41)
Albumin: 4.5 g/dL (ref 3.5–5.0)
Alkaline Phosphatase: 65 U/L (ref 38–126)
Anion gap: 9 (ref 5–15)
BILIRUBIN TOTAL: 0.7 mg/dL (ref 0.3–1.2)
BUN: 25 mg/dL — AB (ref 6–20)
CHLORIDE: 107 mmol/L (ref 101–111)
CO2: 21 mmol/L — ABNORMAL LOW (ref 22–32)
CREATININE: 0.73 mg/dL (ref 0.44–1.00)
Calcium: 9.8 mg/dL (ref 8.9–10.3)
GFR calc Af Amer: >60 mL/min (ref >60)
GLUCOSE: 166 mg/dL — AB (ref 65–99)
Potassium: 4.4 mmol/L (ref 3.5–5.1)
Sodium: 137 mmol/L (ref 135–145)
Total Protein: 7.1 g/dL (ref 6.5–8.1)

## 2016-01-16 LAB — DIFFERENTIAL
BASOS PCT: 0 %
Basophils Absolute: 0 10*3/uL (ref 0.0–0.1)
EOS ABS: 0 10*3/uL (ref 0.0–0.7)
EOS PCT: 0 %
Lymphocytes Relative: 3 %
Lymphs Abs: 0.4 10*3/uL — ABNORMAL LOW (ref 0.7–4.0)
MONO ABS: 0.4 10*3/uL (ref 0.1–1.0)
MONOS PCT: 3 %
Neutro Abs: 12.9 10*3/uL — ABNORMAL HIGH (ref 1.7–7.7)
Neutrophils Relative %: 94 %

## 2016-01-16 LAB — CBC
HCT: 47.2 % — ABNORMAL HIGH (ref 36.0–46.0)
Hemoglobin: 15.7 g/dL — ABNORMAL HIGH (ref 12.0–15.0)
MCH: 34.2 pg — ABNORMAL HIGH (ref 26.0–34.0)
MCHC: 33.3 g/dL (ref 30.0–36.0)
MCV: 102.8 fL — AB (ref 78.0–100.0)
PLATELETS: 348 10*3/uL (ref 150–400)
RBC: 4.59 MIL/uL (ref 3.87–5.11)
RDW: 12.5 % (ref 11.5–15.5)
WBC: 13.6 10*3/uL — AB (ref 4.0–10.5)

## 2016-01-16 LAB — I-STAT BETA HCG BLOOD, ED (MC, WL, AP ONLY)

## 2016-01-16 LAB — URINALYSIS, ROUTINE W REFLEX MICROSCOPIC
BILIRUBIN URINE: NEGATIVE
GLUCOSE, UA: NEGATIVE mg/dL
HGB URINE DIPSTICK: NEGATIVE
KETONES UR: NEGATIVE mg/dL
LEUKOCYTES UA: NEGATIVE
Nitrite: NEGATIVE
PROTEIN: NEGATIVE mg/dL
Specific Gravity, Urine: 1.019 (ref 1.005–1.030)
pH: 5.5 (ref 5.0–8.0)

## 2016-01-16 LAB — LIPASE, BLOOD: LIPASE: 14 U/L (ref 11–51)

## 2016-01-16 MED ORDER — PROCHLORPERAZINE EDISYLATE 5 MG/ML IJ SOLN
10.0000 mg | Freq: Once | INTRAMUSCULAR | Status: AC
  Administered 2016-01-16: 10 mg via INTRAVENOUS
  Filled 2016-01-16: qty 2

## 2016-01-16 MED ORDER — MORPHINE SULFATE (PF) 4 MG/ML IV SOLN
4.0000 mg | Freq: Once | INTRAVENOUS | Status: AC
  Administered 2016-01-16: 4 mg via INTRAVENOUS
  Filled 2016-01-16: qty 1

## 2016-01-16 MED ORDER — DICYCLOMINE HCL 10 MG/ML IM SOLN
20.0000 mg | Freq: Once | INTRAMUSCULAR | Status: AC
  Administered 2016-01-16: 20 mg via INTRAMUSCULAR
  Filled 2016-01-16: qty 2

## 2016-01-16 MED ORDER — MORPHINE SULFATE (PF) 4 MG/ML IV SOLN
4.0000 mg | Freq: Once | INTRAVENOUS | Status: AC
Start: 2016-01-16 — End: 2016-01-16
  Administered 2016-01-16: 4 mg via INTRAVENOUS
  Filled 2016-01-16: qty 1

## 2016-01-16 MED ORDER — PROMETHAZINE HCL 25 MG RE SUPP: 25.0000 mg | Freq: Four times a day (QID) | RECTAL | 0 refills | Status: DC | PRN

## 2016-01-16 MED ORDER — SODIUM CHLORIDE 0.9 % IV BOLUS (SEPSIS)
1000.0000 mL | Freq: Once | INTRAVENOUS | Status: AC
  Administered 2016-01-16: 1000 mL via INTRAVENOUS

## 2016-01-16 MED ORDER — PROMETHAZINE HCL 25 MG/ML IJ SOLN
25.0000 mg | Freq: Once | INTRAMUSCULAR | Status: AC
  Administered 2016-01-16: 25 mg via INTRAVENOUS
  Filled 2016-01-16: qty 1

## 2016-01-16 MED ORDER — HYDROCODONE-ACETAMINOPHEN 5-325 MG PO TABS
1.0000 | ORAL_TABLET | Freq: Four times a day (QID) | ORAL | 0 refills | Status: DC | PRN
Start: 2016-01-16 — End: 2016-05-04

## 2016-01-16 MED ORDER — SODIUM CHLORIDE 0.9 % IV BOLUS (SEPSIS)
2000.0000 mL | Freq: Once | INTRAVENOUS | Status: AC
  Administered 2016-01-16: 2000 mL via INTRAVENOUS

## 2016-01-16 MED ORDER — DICYCLOMINE HCL 20 MG PO TABS: 20.0000 mg | ORAL_TABLET | Freq: Two times a day (BID) | ORAL | 0 refills | Status: DC | PRN

## 2016-01-16 NOTE — ED Notes (Signed)
Lab notified to add on differential

## 2016-01-16 NOTE — ED Notes (Signed)
Pt tolerating ice chips.

## 2016-01-16 NOTE — ED Provider Notes (Signed)
[facility] DEPT Provider Note   CSN: [id number] Arrival date & time: 01/16/16  1050  First Provider Contact:  None       History   Chief Complaint Chief Complaint  Patient presents with  . Abdominal Cramping  . Emesis  . Diarrhea    HPI Felicia Moore is a 35 y.o. female with a PMHx of diverticulitis, remote breast and uterine CA s/p hysterectomy, HTN, migraines, sciatica, and a PSHx of cholecystectomy, who presents to the ED with complaints of nausea, vomiting, diarrhea, body aches, and abdominal cramping that began approximately 8 hours prior to arrival. She states that she had some "bad ice cream" last night, awoke around 11 PM not feeling well, and by 1 AM she developed symptoms. She states that she has had approximately 25 episodes of nonbloody nonbilious emesis since onset, and too numerous to count episodes of nonbloody watery diarrhea. She describes her abdominal pain is 7/10 constant cramping in the generalized abdomen, radiating into her back, worse with vomiting, and improved with heat. She has been unable to take anything for her symptoms because she cannot keep anything down. She denies that this feels similar to prior episodes of diverticulitis.  She denies any fevers, chills, chest pain, shortness of breath, hematemesis, melena, hematochezia, constipation, obstipation, coffee-ground emesis, dysuria, hematuria, vaginal bleeding or discharge, numbness, tingling, focal weakness, recent travel, sick contacts, alcohol use, NSAID use, or recent antibiotics.   The history is provided by the patient and medical records. No language interpreter was used.  Abdominal Cramping  This is a new problem. The current episode started 6 to 12 hours ago. The problem occurs constantly. The problem has not changed since onset.Associated symptoms include abdominal pain. Pertinent negatives include no chest pain and no shortness of breath. Exacerbated by: vomiting. Nothing relieves the  symptoms. She has tried nothing for the symptoms. The treatment provided no relief.  Emesis   Associated symptoms include abdominal pain, diarrhea and myalgias. Pertinent negatives include no arthralgias, no chills and no fever.  Diarrhea   Associated symptoms include abdominal pain, vomiting and myalgias. Pertinent negatives include no chills and no arthralgias.    Past Medical History:  Diagnosis Date  . Breast CA ([place])   . Breast cancer ([place])    left lumpectomy 2006  . Diverticulitis   . Hypertension   . Migraines   . Sciatica   . Stroke [facility]) 2013  . Uterine cancer [facility])     Patient Active Problem List   Diagnosis Date Noted  . Low back pain 09/28/2014  . Essential hypertension 09/28/2014  . Allergic rhinitis 09/28/2014    Past Surgical History:  Procedure Laterality Date  . ABDOMINAL HYSTERECTOMY    . BREAST LUMPECTOMY Left   . CARPAL TUNNEL RELEASE Right   . CHOLECYSTECTOMY      OB History    No data available       Home Medications    Prior to Admission medications   Medication Sig Start Date End Date Taking? Authorizing Provider  carvedilol (COREG) 6.25 MG tablet Take 1 tablet (6.25 mg total) by mouth 2 (two) times daily with a meal. 10/18/14   Billy Fischer, MD  cephALEXin (KEFLEX) 500 MG capsule Take 1 capsule (500 mg total) by mouth 4 (four) times daily. 06/30/15   Shary Decamp, PA-C  diclofenac (CATAFLAM) 50 MG tablet Take 1 tablet (50 mg total) by mouth 3 (three) times daily. One tablet TID with food prn pain. Patient not taking: Reported on  06/09/2015 01/14/15   Janne Napoleon, NP  HYDROcodone-acetaminophen (NORCO) 5-325 MG tablet Take 1-2 tablets by mouth every 6 (six) hours as needed for severe pain. Patient not taking: Reported on 06/30/2015 06/09/15   Orlie Dakin, MD  lidocaine (XYLOCAINE) 5 % ointment Apply 1 application topically as needed. Patient not taking: Reported on 06/09/2015 02/08/15   [name], PA-C  lisinopril-hydrochlorothiazide  (PRINZIDE,ZESTORETIC) 20-12.5 MG per tablet Take 1 tablet by mouth daily. 10/18/14   Billy Fischer, MD  loratadine (CLARITIN) 10 MG tablet Take 1 tablet (10 mg total) by mouth daily. Patient not taking: Reported on 06/09/2015 09/05/14   Clayton Bibles, PA-C  oxyCODONE-acetaminophen (PERCOCET/ROXICET) 5-325 MG tablet Take 1 tablet by mouth every 6 (six) hours as needed for severe pain. 06/30/15   Shary Decamp, PA-C  promethazine (PHENERGAN) 50 MG tablet Take 0.5 tablets (25 mg total) by mouth every 6 (six) hours as needed for nausea or vomiting. Patient not taking: Reported on 06/30/2015 06/09/15   Orlie Dakin, MD  traMADol (ULTRAM) 50 MG tablet Take 1 tablet (50 mg total) by mouth every 6 (six) hours as needed. 12/27/15   Shary Decamp, PA-C  traMADol-acetaminophen (ULTRACET) 37.5-325 MG tablet Take 1 tablet by mouth every 6 (six) hours as needed. 08/02/15   Larene Pickett, PA-C    Family History No family history on file.  Social History Social History  Substance Use Topics  . Smoking status: Current Every Day Smoker    Packs/day: 0.50    Years: 20.00    Types: Cigarettes  . Smokeless tobacco: Never Used  . Alcohol use Yes     Comment: rare     Allergies   Reglan [metoclopramide]; Toradol [ketorolac tromethamine]; and Zofran [ondansetron hcl]   Review of Systems Review of Systems  Constitutional: Negative for chills and fever.  Respiratory: Negative for shortness of breath.   Cardiovascular: Negative for chest pain.  Gastrointestinal: Positive for abdominal pain, diarrhea, nausea and vomiting. Negative for anal bleeding, blood in stool and constipation.  Genitourinary: Negative for dysuria, hematuria, vaginal bleeding and vaginal discharge.  Musculoskeletal: Positive for myalgias. Negative for arthralgias.  Skin: Negative for color change.  Allergic/Immunologic: Negative for immunocompromised state.  Neurological: Negative for weakness and numbness.  Psychiatric/Behavioral: Negative for  confusion.   10 Systems reviewed and are negative for acute change except as noted in the HPI.   Physical Exam Updated Vital Signs BP 133/97   Pulse 76   Temp 98.4 F (36.9 C) (Oral)   Resp 18   SpO2 100%   Physical Exam  Constitutional: She is oriented to person, place, and time. Vital signs are normal. She appears well-developed and well-nourished.  Non-toxic appearance. No distress (actively vomiting).  Afebrile, nontoxic, actively vomiting  HENT:  Head: Normocephalic and atraumatic.  Mouth/Throat: Oropharynx is clear and moist. Mucous membranes are dry.  Mildly dry mucous membranes  Eyes: Conjunctivae and EOM are normal. Right eye exhibits no discharge. Left eye exhibits no discharge.  Neck: Normal range of motion. Neck supple.  Cardiovascular: Regular rhythm, normal heart sounds and intact distal pulses.  Tachycardia present.  Exam reveals no gallop and no friction rub.   No murmur heard. Mildly tachycardic during exam, but resolves once pt stops vomiting  Pulmonary/Chest: Effort normal and breath sounds normal. No respiratory distress. She has no decreased breath sounds. She has no wheezes. She has no rhonchi. She has no rales.  Abdominal: Soft. Normal appearance and bowel sounds are normal. She exhibits no distension.  There is tenderness in the right upper quadrant, epigastric area and left upper quadrant. There is no rigidity, no rebound, no guarding, no CVA tenderness, no tenderness at McBurney's point and negative Murphy's sign.    Soft, nondistended, +BS throughout, with mild upper abd TTP across all upper quadrants/regions, no r/g/r, neg murphy's, neg mcburney's, no CVA TTP   Musculoskeletal: Normal range of motion.  Neurological: She is alert and oriented to person, place, and time. She has normal strength. No sensory deficit.  Skin: Skin is warm, dry and intact. No rash noted.  Psychiatric: She has a normal mood and affect.  Nursing note and vitals reviewed.    ED  Treatments / Results  Labs (all labs ordered are listed, but only abnormal results are displayed) Labs Reviewed  COMPREHENSIVE METABOLIC PANEL - Abnormal; Notable for the following:       Result Value   CO2 21 (*)    Glucose, Bld 166 (*)    BUN 25 (*)    All other components within normal limits  CBC - Abnormal; Notable for the following:    WBC 13.6 (*)    Hemoglobin 15.7 (*)    HCT 47.2 (*)    MCV 102.8 (*)    MCH 34.2 (*)    All other components within normal limits  URINALYSIS, ROUTINE W REFLEX MICROSCOPIC (NOT AT [facility]) - Abnormal; Notable for the following:    APPearance HAZY (*)    All other components within normal limits  DIFFERENTIAL - Abnormal; Notable for the following:    Neutro Abs 12.9 (*)    Lymphs Abs 0.4 (*)    All other components within normal limits  LIPASE, BLOOD    EKG  EKG Interpretation None       Radiology No results found.  Procedures Procedures (including critical care time)  Medications Ordered in ED Medications  sodium chloride 0.9 % bolus 2,000 mL (0 mLs Intravenous Stopped 01/16/16 1413)  promethazine (PHENERGAN) injection 25 mg (25 mg Intravenous Given 01/16/16 1220)  morphine 4 MG/ML injection 4 mg (4 mg Intravenous Given 01/16/16 1216)  dicyclomine (BENTYL) injection 20 mg (20 mg Intramuscular Given 01/16/16 1216)  morphine 4 MG/ML injection 4 mg (4 mg Intravenous Given 01/16/16 1415)  sodium chloride 0.9 % bolus 1,000 mL (0 mLs Intravenous Stopped 01/16/16 1550)  morphine 4 MG/ML injection 4 mg (4 mg Intravenous Given 01/16/16 1551)  prochlorperazine (COMPAZINE) injection 10 mg (10 mg Intravenous Given 01/16/16 1551)     Initial Impression / Assessment and Plan / ED Course  I have reviewed the triage vital signs and the nursing notes.  Pertinent labs & imaging results that were available during my care of the patient were reviewed by me and considered in my medical decision making (see chart for details).  Clinical Course    35  y.o. female here with n/v/d/abd cramps x8hrs after eating "bad ice cream" last night. Thinks she has food poisoning. On exam, mildly tachycardic and actively vomiting, no coffee ground emesis or hematemesis. Some mild upper abdominal tenderness on exam, no focal area of tenderness, nonperitoneal, no lower abd tenderness or mcburney's point tenderness. No vaginal/pelvic complaints, doubt need for pelvic exam, and pt has had hysterectomy so this should not be necessary. Likely gastroenteritis from food poisoning. Pt doesn't think this feels similar to diverticulitis she's had before. Lipase WNL. CBC with WBC 13.6 but specimen is definitely hemoconcentrated with H/H much higher than prior values, and stress demargination could also play a  role. CMP with minimal bump in BUN, likely from dehydration, gluc 166 possibly from stress response. U/A not yet done. Will attempt to collect stool sample for PCR. Will give phenergan, morphine, bentyl, and fluids. Doubt need for imaging at this time, will reassess shortly   2:06 PM U/A not yet collected, will give more fluids to see if this helps produce a urine specimen. No ongoing vomiting but still having some pain, will repeat morphine order. Still haven't collected stool sample, so awaiting that as well. Will recheck shortly.   3:17 PM U/A unremarkable. Tachycardia improved. Pt feeling a little better, no ongoing vomiting, but still feels somewhat nauseated, and pain is still present although minimally improved. States that she thinks if we gave her another dose of pain med and nausea med, we could potentially try to PO challenge, thinks she'll feel up to it if we do that. Will give compazine and morphine. She hasn't had another BM since arriving, so haven't yet collected stool sample. Will reassess after compazine/morphine, attempt PO challenge, and see if she has had a BM. Will reassess shortly.  4:33 PM Pt feeling much better. No ongoing n/v and pain improved. PO  challenged and able to tolerate well. Stool samples never collected since she stopped having diarrhea here, so will cancel these. Discussed that this is likely gastroenteritis from foodborne source, doubt need for empiric abx, likely viral. Will rx phenergan for nausea, discussed staying hydrated, and discussed BRAT diet. Bentyl rx given as well since this helped here. Pain meds given. F/up with PCP in 3-5 days for recheck. I explained the diagnosis and have given explicit precautions to return to the ER including for any other new or worsening symptoms. The patient understands and accepts the medical plan as it's been dictated and I have answered their questions. Discharge instructions concerning home care and prescriptions have been given. The patient is STABLE and is discharged to home in good condition.   Final Clinical Impressions(s) / ED Diagnoses   Final diagnoses:  Nausea vomiting and diarrhea  Abdominal cramping  Foodborne gastroenteritis  Viral gastroenteritis  Dehydration    New Prescriptions New Prescriptions   DICYCLOMINE (BENTYL) 20 MG TABLET    Take 1 tablet (20 mg total) by mouth 2 (two) times daily as needed for spasms (abdominal cramping).   HYDROCODONE-ACETAMINOPHEN (NORCO) 5-325 MG TABLET    Take 1 tablet by mouth every 6 (six) hours as needed for severe pain.   PROMETHAZINE (PHENERGAN) 25 MG SUPPOSITORY    Place 1 suppository (25 mg total) rectally every 6 (six) hours as needed for nausea or vomiting.     [address] [place], PA-C 01/16/16 [place], MD 01/19/16 [phone]

## 2016-01-16 NOTE — Discharge Instructions (Signed)
Use phenergan as prescribed, as needed for nausea. Use tylenol or motrin as needed for pain, and use norco for additional relief but don't drive while taking norco. Stay well hydrated with small sips of fluids throughout the day. Use bentyl as directed for abdominal cramping and diarrhea. Follow a BRAT (banana-rice-applesauce-toast) diet as described below for the next 24-48 hours. The 'BRAT' diet is suggested, then progress to diet as tolerated as symptoms abate. Call your regular doctor if bloody stools, persistent diarrhea, vomiting, fever or abdominal pain. Follow up with your regular doctor in 3-5 days for recheck of symptoms. Return to ER for changing or worsening of symptoms.

## 2016-01-16 NOTE — ED Triage Notes (Signed)
Patient here with abdominal cramping x 8 hours. Here with nausea, vomiting, diarrhea. States she feels weak. Alert and oriented. States that she thinks that the ice cream she ate was bad

## 2016-01-16 NOTE — ED Notes (Signed)
Bedside commode in room, pt instructed to use if having more diarrhea to collect stool sample

## 2016-05-04 ENCOUNTER — Emergency Department (HOSPITAL_COMMUNITY): Payer: Medicaid Other

## 2016-05-04 ENCOUNTER — Encounter (HOSPITAL_COMMUNITY): Payer: Self-pay | Admitting: Vascular Surgery

## 2016-05-04 ENCOUNTER — Emergency Department (HOSPITAL_COMMUNITY)
Admission: EM | Admit: 2016-05-04 | Discharge: 2016-05-04 | Disposition: A | Discharge status: Home / Self Care | Payer: Medicaid Other | Attending: Emergency Medicine | Admitting: Emergency Medicine

## 2016-05-04 DIAGNOSIS — I10 Essential (primary) hypertension: Secondary | ICD-10-CM | POA: Insufficient documentation

## 2016-05-04 DIAGNOSIS — Z8673 Personal history of transient ischemic attack (TIA), and cerebral infarction without residual deficits: Secondary | ICD-10-CM | POA: Diagnosis not present

## 2016-05-04 DIAGNOSIS — R1012 Left upper quadrant pain: Secondary | ICD-10-CM | POA: Diagnosis present

## 2016-05-04 DIAGNOSIS — Z79899 Other long term (current) drug therapy: Secondary | ICD-10-CM | POA: Insufficient documentation

## 2016-05-04 DIAGNOSIS — K6289 Other specified diseases of anus and rectum: Secondary | ICD-10-CM | POA: Diagnosis not present

## 2016-05-04 DIAGNOSIS — F17210 Nicotine dependence, cigarettes, uncomplicated: Secondary | ICD-10-CM | POA: Diagnosis not present

## 2016-05-04 DIAGNOSIS — K529 Noninfective gastroenteritis and colitis, unspecified: Secondary | ICD-10-CM

## 2016-05-04 DIAGNOSIS — Z853 Personal history of malignant neoplasm of breast: Secondary | ICD-10-CM | POA: Insufficient documentation

## 2016-05-04 LAB — COMPREHENSIVE METABOLIC PANEL
ALK PHOS: 46 U/L (ref 38–126)
ALT: 10 U/L — ABNORMAL LOW (ref 14–54)
ANION GAP: 9 (ref 5–15)
AST: 20 U/L (ref 15–41)
Albumin: 3.6 g/dL (ref 3.5–5.0)
BUN: 14 mg/dL (ref 6–20)
CALCIUM: 9.2 mg/dL (ref 8.9–10.3)
CO2: 27 mmol/L (ref 22–32)
Chloride: 105 mmol/L (ref 101–111)
Creatinine, Ser: 0.93 mg/dL (ref 0.44–1.00)
GFR calc non Af Amer: >60 mL/min (ref >60)
Glucose, Bld: 86 mg/dL (ref 65–99)
Potassium: 4 mmol/L (ref 3.5–5.1)
SODIUM: 141 mmol/L (ref 135–145)
Total Bilirubin: <0.1 mg/dL — ABNORMAL LOW (ref 0.3–1.2)
Total Protein: 5.9 g/dL — ABNORMAL LOW (ref 6.5–8.1)

## 2016-05-04 LAB — URINALYSIS, ROUTINE W REFLEX MICROSCOPIC
Bilirubin Urine: NEGATIVE
Glucose, UA: NEGATIVE mg/dL
Hgb urine dipstick: NEGATIVE
Ketones, ur: NEGATIVE mg/dL
Leukocytes, UA: NEGATIVE
NITRITE: NEGATIVE
Protein, ur: NEGATIVE mg/dL
SPECIFIC GRAVITY, URINE: 1.026 (ref 1.005–1.030)
pH: 5.5 (ref 5.0–8.0)

## 2016-05-04 LAB — CBC
HCT: 39.4 % (ref 36.0–46.0)
HEMOGLOBIN: 13 g/dL (ref 12.0–15.0)
MCH: 33.5 pg (ref 26.0–34.0)
MCHC: 33 g/dL (ref 30.0–36.0)
MCV: 101.5 fL — ABNORMAL HIGH (ref 78.0–100.0)
Platelets: 377 10*3/uL (ref 150–400)
RBC: 3.88 MIL/uL (ref 3.87–5.11)
RDW: 12.4 % (ref 11.5–15.5)
WBC: 11.7 10*3/uL — ABNORMAL HIGH (ref 4.0–10.5)

## 2016-05-04 LAB — LIPASE, BLOOD: LIPASE: 16 U/L (ref 11–51)

## 2016-05-04 MED ORDER — METRONIDAZOLE 500 MG PO TABS: 500.0000 mg | ORAL_TABLET | Freq: Two times a day (BID) | ORAL | 0 refills | Status: DC

## 2016-05-04 MED ORDER — CIPROFLOXACIN HCL 500 MG PO TABS: 500.0000 mg | ORAL_TABLET | Freq: Two times a day (BID) | ORAL | 0 refills | Status: DC

## 2016-05-04 MED ORDER — METRONIDAZOLE 500 MG PO TABS
500.0000 mg | ORAL_TABLET | Freq: Once | ORAL | Status: AC
  Administered 2016-05-04: 500 mg via ORAL
  Filled 2016-05-04: qty 1

## 2016-05-04 MED ORDER — CIPROFLOXACIN HCL 500 MG PO TABS
500.0000 mg | ORAL_TABLET | Freq: Once | ORAL | Status: AC
  Administered 2016-05-04: 500 mg via ORAL
  Filled 2016-05-04: qty 1

## 2016-05-04 MED ORDER — MORPHINE SULFATE (PF) 4 MG/ML IV SOLN
4.0000 mg | Freq: Once | INTRAVENOUS | Status: AC
  Administered 2016-05-04: 4 mg via INTRAVENOUS
  Filled 2016-05-04: qty 1

## 2016-05-04 MED ORDER — IOPAMIDOL (ISOVUE-300) INJECTION 61%
INTRAVENOUS | Status: AC
  Administered 2016-05-04: 100 mL
  Filled 2016-05-04: qty 100

## 2016-05-04 MED ORDER — HYDROCODONE-ACETAMINOPHEN 5-325 MG PO TABS: ORAL_TABLET | ORAL | 0 refills | Status: DC

## 2016-05-04 NOTE — ED Notes (Signed)
Pt arrived in the room. A/O x4, c/o nausea and abdominal pain.

## 2016-05-04 NOTE — Discharge Instructions (Signed)
Please read and follow all provided instructions.  Your diagnoses today include:  1. Coloproctitis     Tests performed today include:  Blood counts and electrolytes  Blood tests to check liver and kidney function  Blood tests to check pancreas function  Urine test to look for infection'  CT scan of your abdomen - shows colitis  Vital signs. See below for your results today.   Medications prescribed:   Ciprofloxacin - antibiotic  You have been prescribed an antibiotic medicine: take the entire course of medicine even if you are feeling better. Stopping early can cause the antibiotic not to work.   Metronidazole - antibiotic  You have been prescribed an antibiotic medicine: take the entire course of medicine even if you are feeling better. Stopping early can cause the antibiotic not to work. Do not drink alcohol when taking this medication.    Vicodin (hydrocodone/acetaminophen) - narcotic pain medication  DO NOT drive or perform any activities that require you to be awake and alert because this medicine can make you drowsy. BE VERY CAREFUL not to take multiple medicines containing Tylenol (also called acetaminophen). Doing so can lead to an overdose which can damage your liver and cause liver failure and possibly death.  Take any prescribed medications only as directed.  Home care instructions:   Follow any educational materials contained in this packet.  Follow-up instructions: Please follow-up with your primary care provider in the next 3 days for further evaluation of your symptoms.    Return instructions:  SEEK IMMEDIATE MEDICAL ATTENTION IF:  The pain does not go away or becomes severe   A temperature above 101F develops   Repeated vomiting occurs (multiple episodes)   The pain becomes localized to portions of the abdomen. The right side could possibly be appendicitis. In an adult, the left lower portion of the abdomen could be colitis or diverticulitis.    Blood is being passed in stools or vomit (bright red or black tarry stools)   You develop chest pain, difficulty breathing, dizziness or fainting, or become confused, poorly responsive, or inconsolable (young children)  If you have any other emergent concerns regarding your health  Additional Information: Abdominal (belly) pain can be caused by many things. Your caregiver performed an examination and possibly ordered blood/urine tests and imaging (CT scan, x-rays, ultrasound). Many cases can be observed and treated at home after initial evaluation in the emergency department. Even though you are being discharged home, abdominal pain can be unpredictable. Therefore, you need a repeated exam if your pain does not resolve, returns, or worsens. Most patients with abdominal pain don't have to be admitted to the hospital or have surgery, but serious problems like appendicitis and gallbladder attacks can start out as nonspecific pain. Many abdominal conditions cannot be diagnosed in one visit, so follow-up evaluations are very important.  Your vital signs today were: BP 119/80    Pulse 68    Temp 98.1 F (36.7 C) (Oral)    Resp 18    SpO2 99%  If your blood pressure (bp) was elevated above 135/85 this visit, please have this repeated by your doctor within one month. --------------

## 2016-05-04 NOTE — ED Notes (Signed)
Attempted IV x2. 

## 2016-05-04 NOTE — ED Provider Notes (Signed)
[facility] DEPT Provider Note   CSN: [id number] Arrival date & time: 05/04/16  1415     History   Chief Complaint Chief Complaint  Patient presents with  . Abdominal Pain    HPI Felicia Moore is a 35 y.o. female.  Patient with history of cholecystectomy and hysterectomy with reported history of diverticulitis presents with complaints of left upper quadrant abdominal pain, similar to previous, over the past 3 days. She has had associated fever to 102F. She has had nausea and decreased appetite with 2 episodes of vomiting today. She has had loose stools but no diarrhea. No urinary symptoms. She has taken Tylenol for fever but this has not helped her pain. No blood in vomit or stool. No other treatments. 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 Patient states that she has not been on this medication "in months". The onset of this condition was acute. The course is constant. Aggravating factors: none. Alleviating factors: none.        Past Medical History:  Diagnosis Date  . Breast CA ([place])   . Breast cancer ([place])    left lumpectomy 2006  . Diverticulitis   . Hypertension   . Migraines   . Sciatica   . Stroke [facility]) 2013  . Uterine cancer [facility])     Patient Active Problem List   Diagnosis Date Noted  . Low back pain 09/28/2014  . Essential hypertension 09/28/2014  . Allergic rhinitis 09/28/2014    Past Surgical History:  Procedure Laterality Date  . ABDOMINAL HYSTERECTOMY    . BREAST LUMPECTOMY Left   . CARPAL TUNNEL RELEASE Right   . CHOLECYSTECTOMY      OB History    No data available       Home Medications    Prior to Admission medications   Medication Sig Start Date End Date Taking? Authorizing Provider  carvedilol (COREG) 6.25 MG tablet Take 1 tablet (6.25 mg total) by mouth 2 (two) times daily with a meal. 10/18/14   Billy Fischer, MD  cephALEXin (KEFLEX) 500 MG capsule Take 1 capsule (500 mg total) by mouth 4  (four) times daily. 06/30/15   Shary Decamp, PA-C  diclofenac (CATAFLAM) 50 MG tablet Take 1 tablet (50 mg total) by mouth 3 (three) times daily. One tablet TID with food prn pain. Patient not taking: Reported on 06/09/2015 01/14/15   Janne Napoleon, NP  dicyclomine (BENTYL) 20 MG tablet Take 1 tablet (20 mg total) by mouth 2 (two) times daily as needed for spasms (abdominal cramping). 01/16/16   Mercedes Camprubi-Soms, PA-C  HYDROcodone-acetaminophen (NORCO) 5-325 MG tablet Take 1-2 tablets by mouth every 6 (six) hours as needed for severe pain. Patient not taking: Reported on 06/30/2015 06/09/15   Orlie Dakin, MD  HYDROcodone-acetaminophen (NORCO) 5-325 MG tablet Take 1 tablet by mouth every 6 (six) hours as needed for severe pain. 01/16/16   Mercedes Camprubi-Soms, PA-C  lidocaine (XYLOCAINE) 5 % ointment Apply 1 application topically as needed. Patient not taking: Reported on 06/09/2015 02/08/15   [name], PA-C  lisinopril-hydrochlorothiazide (PRINZIDE,ZESTORETIC) 20-12.5 MG per tablet Take 1 tablet by mouth daily. 10/18/14   Billy Fischer, MD  loratadine (CLARITIN) 10 MG tablet Take 1 tablet (10 mg total) by mouth daily. Patient not taking: Reported on 06/09/2015 09/05/14   Clayton Bibles, PA-C  oxyCODONE-acetaminophen (PERCOCET/ROXICET) 5-325 MG tablet Take 1 tablet by mouth every 6 (six) hours as needed for severe pain. 06/30/15   Shary Decamp, PA-C  promethazine (PHENERGAN) 25 MG suppository Place 1 suppository (25 mg total) rectally every 6 (six) hours as needed for nausea or vomiting. 01/16/16   Mercedes Camprubi-Soms, PA-C  promethazine (PHENERGAN) 50 MG tablet Take 0.5 tablets (25 mg total) by mouth every 6 (six) hours as needed for nausea or vomiting. Patient not taking: Reported on 06/30/2015 06/09/15   Orlie Dakin, MD  traMADol (ULTRAM) 50 MG tablet Take 1 tablet (50 mg total) by mouth every 6 (six) hours as needed. 12/27/15   Shary Decamp, PA-C  traMADol-acetaminophen (ULTRACET) 37.5-325 MG tablet Take 1  tablet by mouth every 6 (six) hours as needed. 08/02/15   Larene Pickett, PA-C    Family History No family history on file.  Social History Social History  Substance Use Topics  . Smoking status: Current Every Day Smoker    Packs/day: 0.50    Years: 20.00    Types: Cigarettes  . Smokeless tobacco: Never Used  . Alcohol use Yes     Comment: rare     Allergies   Reglan [metoclopramide]; Toradol [ketorolac tromethamine]; and Zofran [ondansetron hcl]   Review of Systems Review of Systems  Constitutional: Positive for appetite change, chills and fever.  HENT: Negative for rhinorrhea and sore throat.   Eyes: Negative for redness.  Respiratory: Negative for cough.   Cardiovascular: Negative for chest pain.  Gastrointestinal: Positive for abdominal pain, nausea and vomiting. Negative for blood in stool and diarrhea.  Genitourinary: Negative for dysuria.  Musculoskeletal: Negative for myalgias.  Skin: Negative for rash.  Neurological: Negative for headaches.     Physical Exam Updated Vital Signs BP 120/68 (BP Location: Left Arm)   Pulse 70   Temp 98.1 F (36.7 C) (Oral)   Resp 16   SpO2 99%   Physical Exam  Constitutional: She appears well-developed and well-nourished.  HENT:  Head: Normocephalic and atraumatic.  Mouth/Throat: Oropharynx is clear and moist.  Eyes: Conjunctivae are normal. Right eye exhibits no discharge. Left eye exhibits no discharge.  Neck: Normal range of motion. Neck supple.  Cardiovascular: Normal rate, regular rhythm and normal heart sounds.   Pulmonary/Chest: Effort normal and breath sounds normal. No respiratory distress. She has no wheezes. She has no rales.  Abdominal: Soft. She exhibits no distension and no mass. There is tenderness (Mild, left upper quadrant). There is no guarding.  Neurological: She is alert.  Skin: Skin is warm and dry.  Psychiatric: She has a normal mood and affect.  Nursing note and vitals reviewed.    ED  Treatments / Results  Labs (all labs ordered are listed, but only abnormal results are displayed) Labs Reviewed  COMPREHENSIVE METABOLIC PANEL - Abnormal; Notable for the following:       Result Value   Total Protein 5.9 (*)    ALT 10 (*)    Total Bilirubin <0.1 (*)    All other components within normal limits  CBC - Abnormal; Notable for the following:    WBC 11.7 (*)    MCV 101.5 (*)    All other components within normal limits  LIPASE, BLOOD  URINALYSIS, ROUTINE W REFLEX MICROSCOPIC (NOT AT [facility])    Radiology Ct Abdomen Pelvis W Contrast  Result Date: 05/04/2016 CLINICAL DATA:  Left upper quadrant pain for 3 days. EXAM: CT ABDOMEN AND PELVIS WITH CONTRAST TECHNIQUE: Multidetector CT imaging of the abdomen and pelvis was performed using the standard protocol following bolus administration of intravenous contrast. CONTRAST:  151mL ISOVUE-300 IOPAMIDOL (ISOVUE-300) INJECTION 61% COMPARISON:  06/30/2015 CT FINDINGS: LOWER CHEST: Lung bases are clear. There is some bibasilar dependent atelectasis. Included heart size is normal. No pericardial effusion. Small hiatal hernia. HEPATOBILIARY: The patient is status post cholecystectomy. There is mild resultant intrahepatic ductal dilatation. No space-occupying mass of the liver. PANCREAS: Normal. SPLEEN: Normal. ADRENALS/URINARY TRACT: Kidneys are orthotopic, demonstrating symmetric enhancement. No nephrolithiasis, hydronephrosis or solid renal masses. The unopacified ureters are normal in course and caliber. Delayed imaging through the kidneys demonstrates symmetric prompt contrast excretion within the proximal urinary collecting system. Urinary bladder is partially distended and unremarkable. Normal adrenal glands. STOMACH/BOWEL: The stomach is normal in appearance. There is no small bowel dilatation or inflammatory thickening. There is mild to moderate transmural thickening of large bowel from the transverse colon to rectum. Normal-appearing  appendix. VASCULAR/LYMPHATIC: Aortoiliac vessels are normal in course and caliber. No lymphadenopathy by CT size criteria. REPRODUCTIVE: Hysterectomy. OTHER: No intraperitoneal free fluid or free air. MUSCULOSKELETAL: Nonacute. IMPRESSION: Diffuse inflammatory thickening of large bowel from mid transverse colon through rectum consistent with proctocolitis. Status post cholecystectomy. Electronically Signed   By: Ashley Royalty M.D.   On: 05/04/2016 19:39    Procedures Procedures (including critical care time)  Medications Ordered in ED Medications  ciprofloxacin (CIPRO) tablet 500 mg (not administered)  metroNIDAZOLE (FLAGYL) tablet 500 mg (not administered)  morphine 4 MG/ML injection 4 mg (4 mg Intravenous Given 05/04/16 1837)  iopamidol (ISOVUE-300) 61 % injection (100 mLs  Contrast Given 05/04/16 1912)     Initial Impression / Assessment and Plan / ED Course  I have reviewed the triage vital signs and the nursing notes.  Pertinent labs & imaging results that were available during my care of the patient were reviewed by me and considered in my medical decision making (see chart for details).  Clinical Course    Patient seen and examined. Labs reviewed. Given reported fevers, mild leukocytosis, will check CT to rule in/out intra-abdominal infection requiring antibiotics. Patient Has had one positive CT scan here showing right upper quadrant diverticulitis. Several other CT scans for same symptoms were unrevealing.  Vital signs reviewed and are as follows: BP 120/68 (BP Location: Left Arm)   Pulse 70   Temp 98.1 F (36.7 C) (Oral)   Resp 16   SpO2 99%   8:00 PM patient updated on results. Patient given first dose of Cipro and Flagyl here. Will discharge to home with #8 Vicodin 5/325.  Encouraged PCP follow-up for recheck in the next several days.  The patient was urged to return to the Emergency Department immediately with worsening of current symptoms, worsening abdominal pain,  persistent vomiting, blood noted in stools, fever, or any other concerns. The patient verbalized understanding.   Patient counseled on use of narcotic pain medications. Counseled not to combine these medications with others containing tylenol. Urged not to drink alcohol, drive, or perform any other activities that requires focus while taking these medications. The patient verbalizes understanding and agrees with the plan.  Final Clinical Impressions(s) / ED Diagnoses   Final diagnoses:  Coloproctitis   Patient with coloproctitis on CT scan. She is tolerating PO's and symptoms controlled. Patient appears well, feel safe for discharge with treatment at home. Return instructions as above.   New Prescriptions New Prescriptions   CIPROFLOXACIN (CIPRO) 500 MG TABLET    Take 1 tablet (500 mg total) by mouth 2 (two) times daily.   HYDROCODONE-ACETAMINOPHEN (NORCO/VICODIN) 5-325 MG TABLET    Take 1-2 tablets every 6 hours as needed for severe  pain   METRONIDAZOLE (FLAGYL) 500 MG TABLET    Take 1 tablet (500 mg total) by mouth 2 (two) times daily.     Carlisle Cater, PA-C 05/04/16 2003    Nat Christen, MD 05/15/16 0930

## 2016-05-04 NOTE — ED Notes (Signed)
Patient transported to CT 

## 2016-05-04 NOTE — ED Notes (Signed)
Pt came to Info Desk asking if her name had been called, told her it hasn't. She states that she is going to walk outside to call & check on her daughter since there is no reception in the hospital.

## 2016-05-04 NOTE — ED Triage Notes (Addendum)
Pt reports to the ED for eval of left lower abd pain, abd distention, altered bowel pattern, and N/V x several days. Has progressively worse. Denies any blood in emesis/stool. Pt has hx of diverticulitis and states this feels similar. Also reports some fevers and chills.

## 2016-05-06 IMAGING — DX DG CHEST 2V
2 series · 2 of 2 positions shown · non-contrast
Comparison: None.

CLINICAL DATA: Productive cough. Chest congestion. Dizziness for
the past 2 weeks. Smoker. Hypertension.

EXAM:
CHEST  2 VIEW

[chest pa]
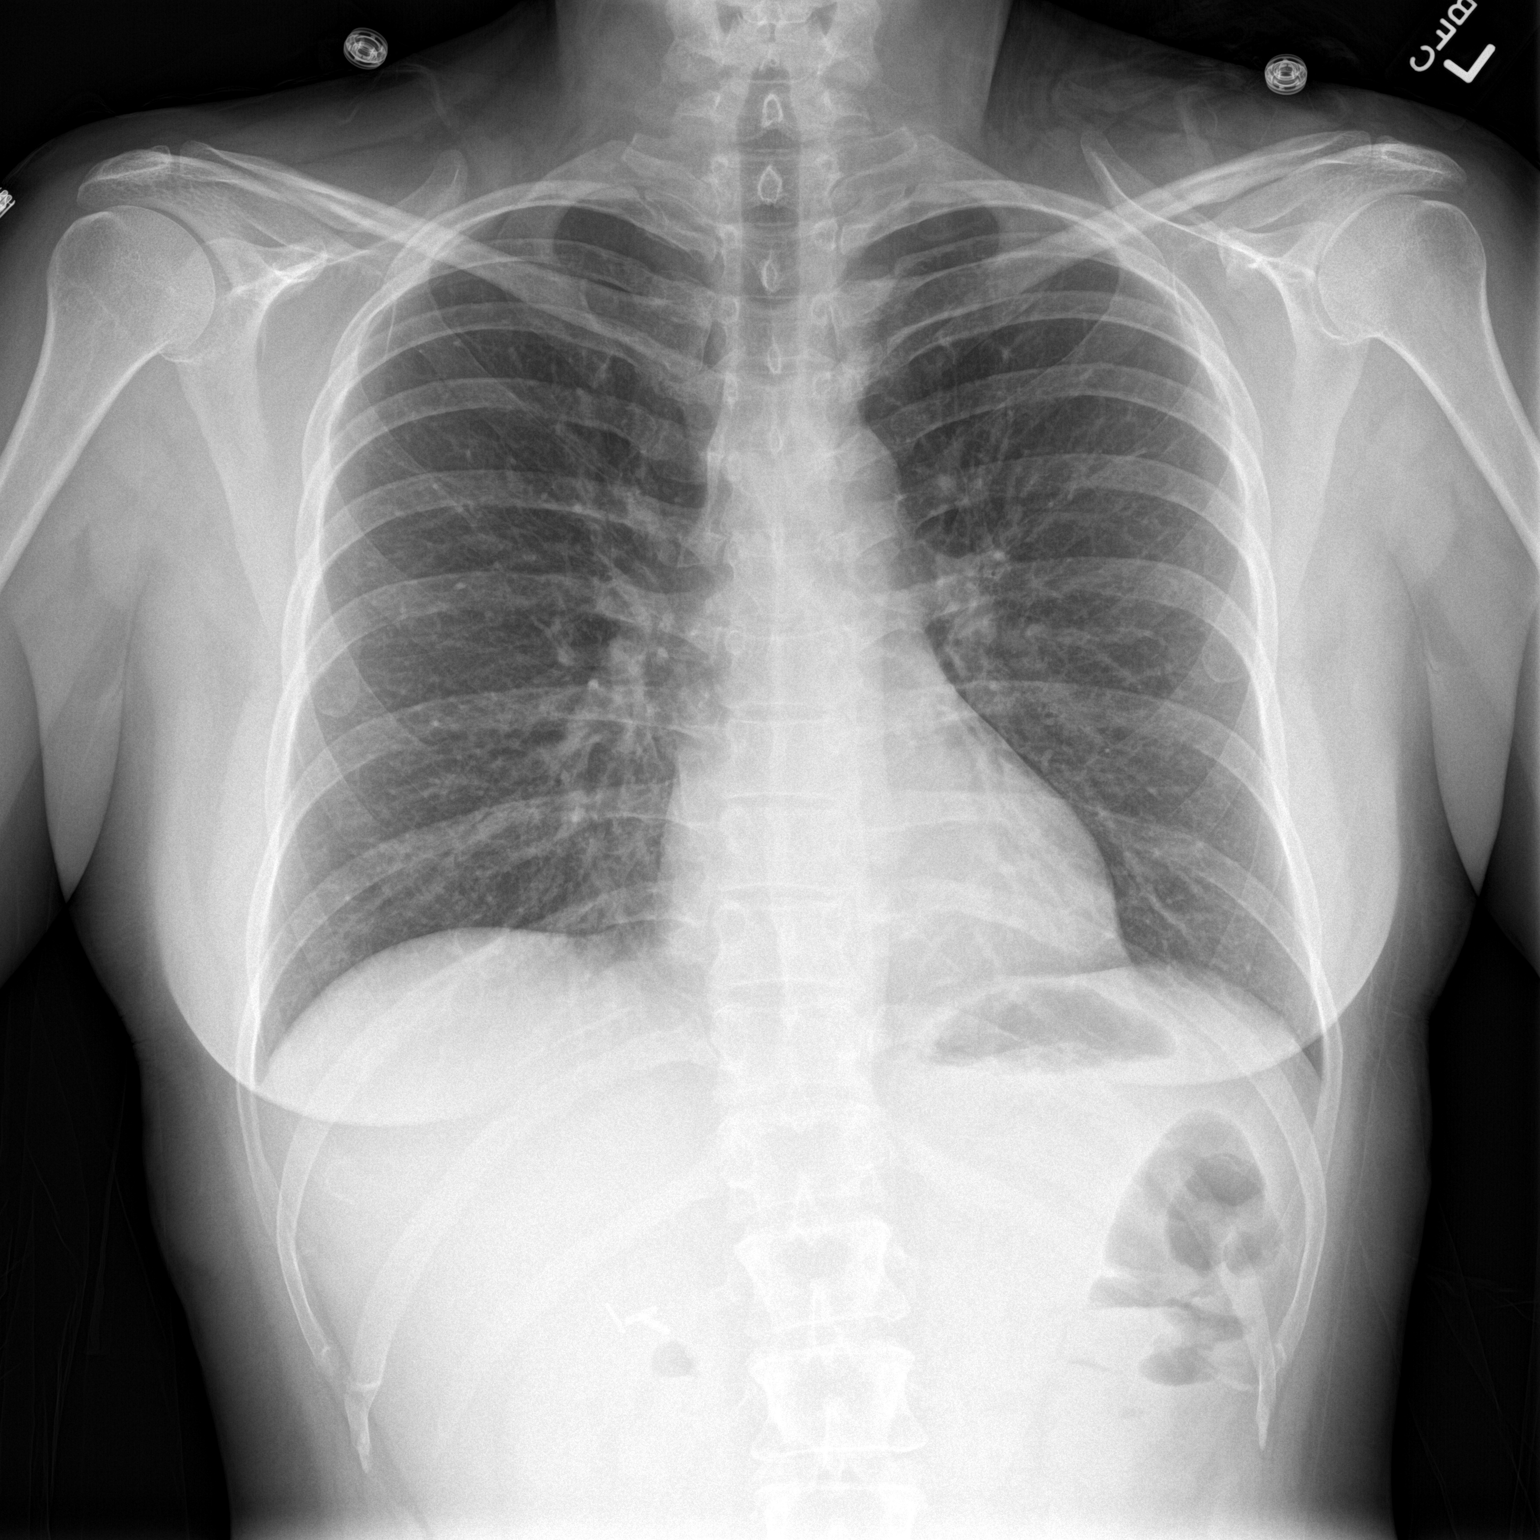

[chest lat]
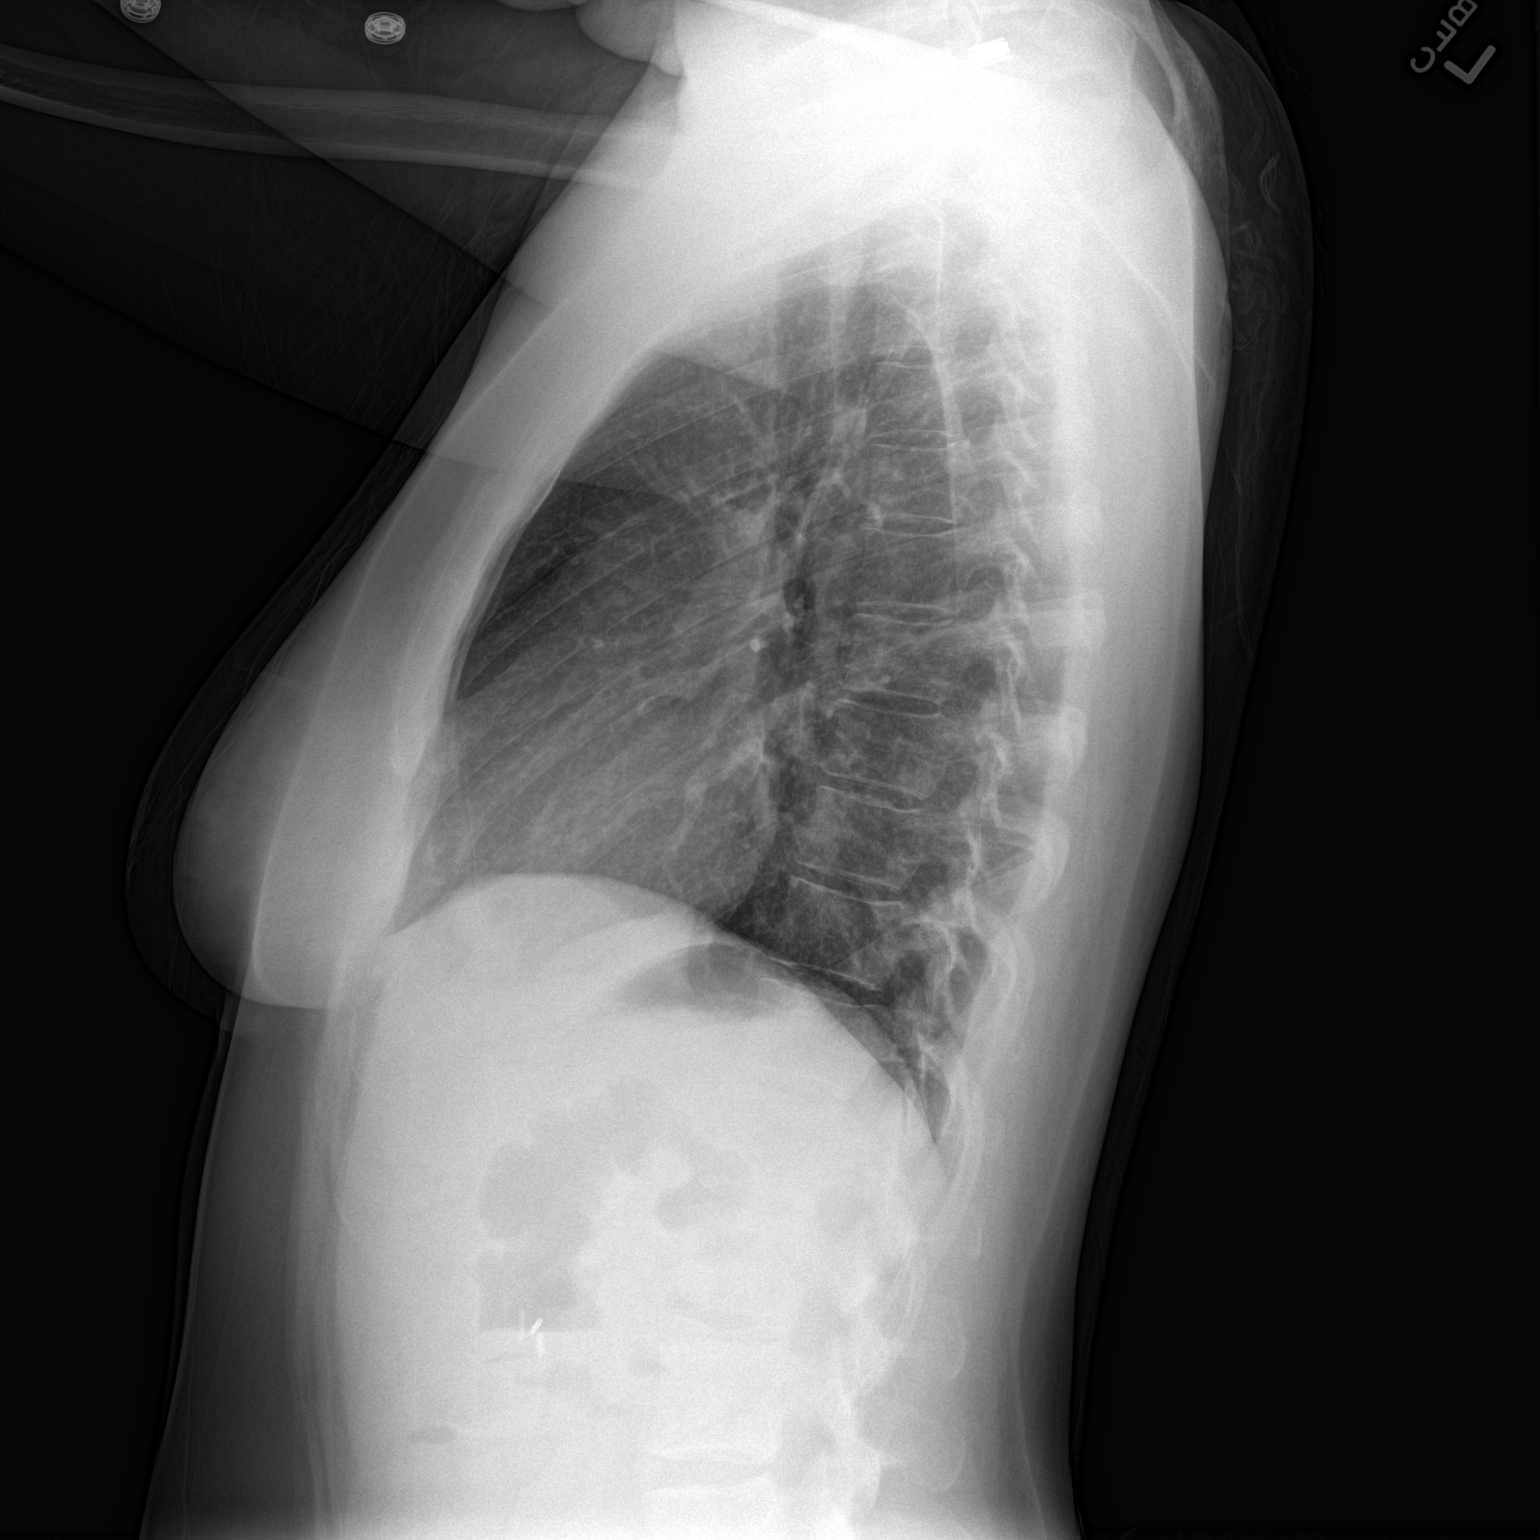

[2 of 2 positions shown; findings below may reference images not displayed]

FINDINGS: Normal sized heart. Clear lungs. Mild diffuse peribronchial
thickening and accentuation of the interstitial markings. Mild
scoliosis. Cholecystectomy clips.
IMPRESSION: No acute abnormality.  Mild chronic bronchitic changes.

## 2016-05-27 IMAGING — CR DG ABDOMEN 1V
1 series · 1 of 1 positions shown · non-contrast
Comparison: None.

CLINICAL DATA: Abdominal pain, distention, and swelling. Nausea,
vomiting, and constipation for 1 day.

EXAM:
ABDOMEN - 1 VIEW

[t abdomen supine]
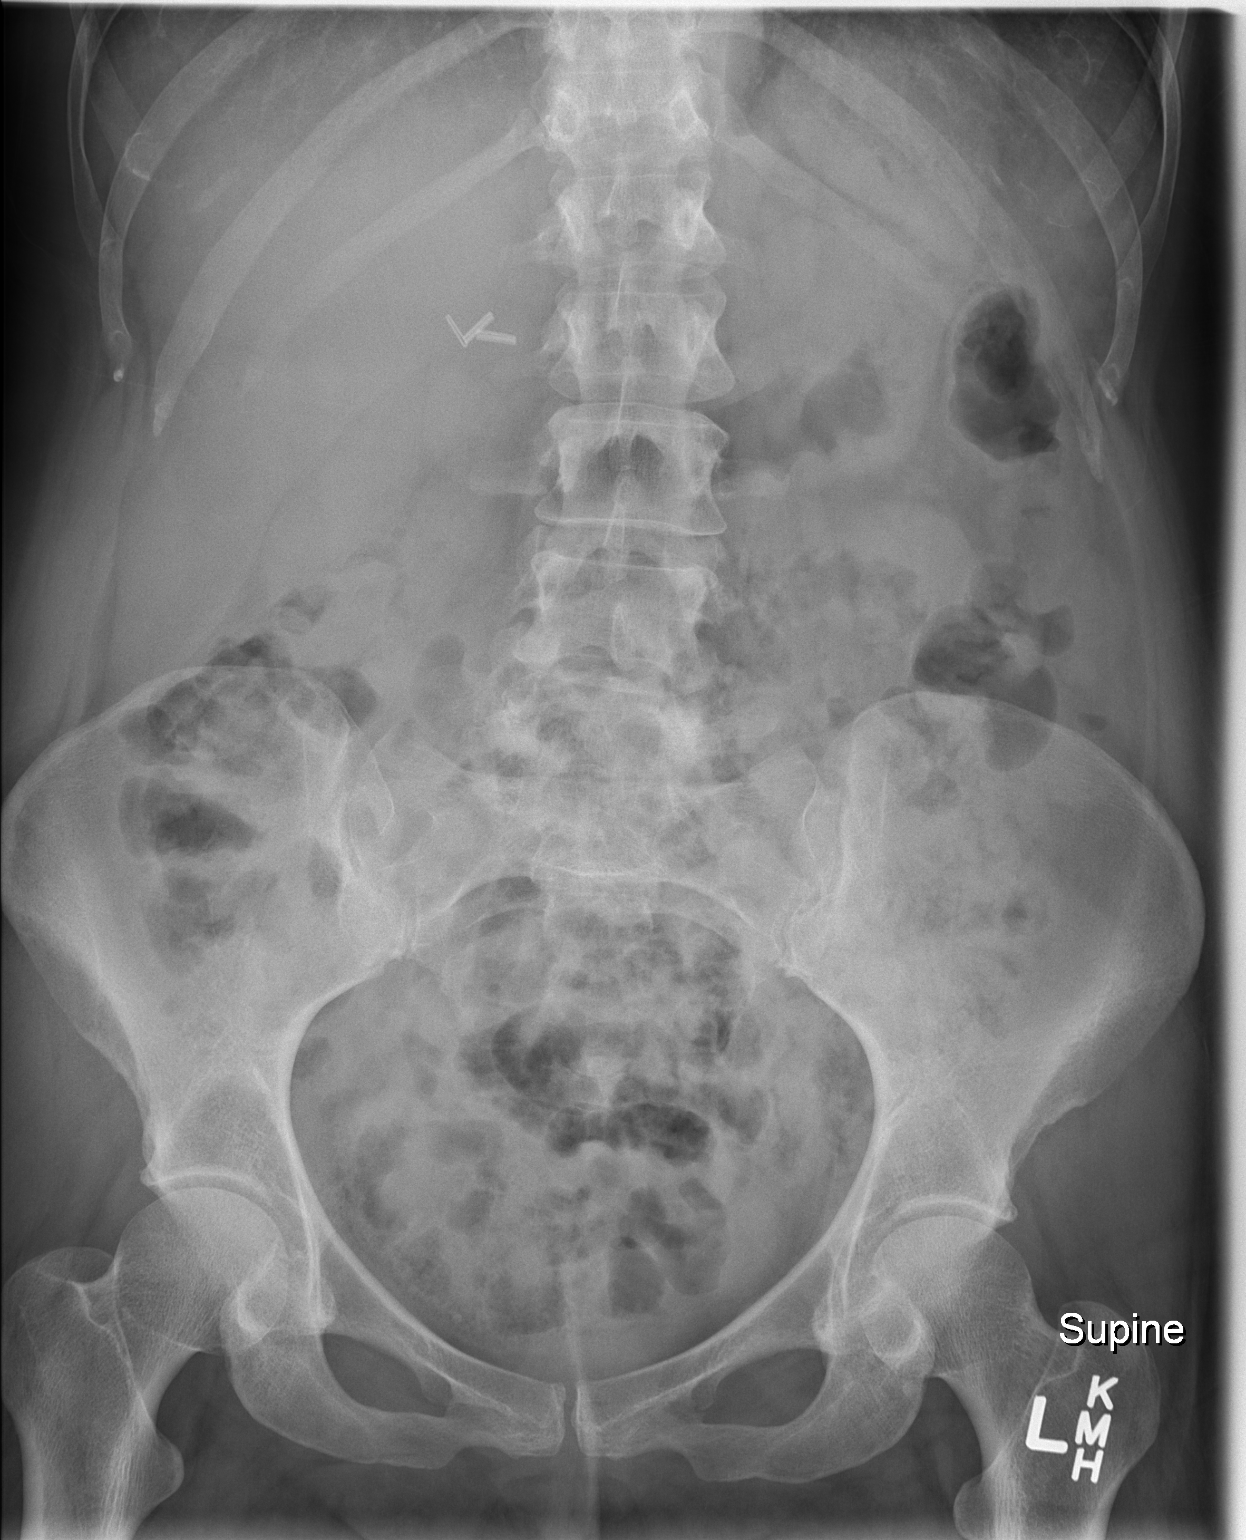

[1 of 1 positions shown; findings below may reference images not displayed]

FINDINGS: Right upper quadrant abdominal surgical clips are noted. Gas is
present in the nondilated small and large bowel without evidence of
obstruction. There is a small to moderate amount of stool throughout
the colon. No acute osseous abnormality is seen.
IMPRESSION: Nonobstructed bowel-gas pattern.

## 2016-06-14 ENCOUNTER — Emergency Department (HOSPITAL_COMMUNITY): Payer: Medicaid Other

## 2016-06-14 ENCOUNTER — Encounter (HOSPITAL_COMMUNITY): Payer: Self-pay | Admitting: *Deleted

## 2016-06-14 ENCOUNTER — Emergency Department (HOSPITAL_COMMUNITY)
Admission: EM | Admit: 2016-06-14 | Discharge: 2016-06-15 | Disposition: A | Discharge status: Home / Self Care | Payer: Medicaid Other | Attending: Emergency Medicine | Admitting: Emergency Medicine

## 2016-06-14 DIAGNOSIS — Z8673 Personal history of transient ischemic attack (TIA), and cerebral infarction without residual deficits: Secondary | ICD-10-CM | POA: Diagnosis not present

## 2016-06-14 DIAGNOSIS — F17210 Nicotine dependence, cigarettes, uncomplicated: Secondary | ICD-10-CM | POA: Diagnosis not present

## 2016-06-14 DIAGNOSIS — Z8541 Personal history of malignant neoplasm of cervix uteri: Secondary | ICD-10-CM | POA: Insufficient documentation

## 2016-06-14 DIAGNOSIS — C169 Malignant neoplasm of stomach, unspecified: Secondary | ICD-10-CM | POA: Diagnosis not present

## 2016-06-14 DIAGNOSIS — Z79899 Other long term (current) drug therapy: Secondary | ICD-10-CM | POA: Diagnosis not present

## 2016-06-14 DIAGNOSIS — Z853 Personal history of malignant neoplasm of breast: Secondary | ICD-10-CM | POA: Diagnosis not present

## 2016-06-14 DIAGNOSIS — I10 Essential (primary) hypertension: Secondary | ICD-10-CM | POA: Diagnosis not present

## 2016-06-14 DIAGNOSIS — R1012 Left upper quadrant pain: Secondary | ICD-10-CM | POA: Diagnosis present

## 2016-06-14 DIAGNOSIS — R197 Diarrhea, unspecified: Secondary | ICD-10-CM

## 2016-06-14 DIAGNOSIS — R112 Nausea with vomiting, unspecified: Secondary | ICD-10-CM | POA: Diagnosis not present

## 2016-06-14 LAB — COMPREHENSIVE METABOLIC PANEL
ALK PHOS: 46 U/L (ref 38–126)
ALT: 13 U/L — AB (ref 14–54)
AST: 17 U/L (ref 15–41)
Albumin: 3 g/dL — ABNORMAL LOW (ref 3.5–5.0)
Anion gap: 7 (ref 5–15)
BILIRUBIN TOTAL: 0.2 mg/dL — AB (ref 0.3–1.2)
BUN: 10 mg/dL (ref 6–20)
CHLORIDE: 105 mmol/L (ref 101–111)
CO2: 28 mmol/L (ref 22–32)
CREATININE: 0.73 mg/dL (ref 0.44–1.00)
Calcium: 9 mg/dL (ref 8.9–10.3)
Glucose, Bld: 92 mg/dL (ref 65–99)
Potassium: 3.6 mmol/L (ref 3.5–5.1)
Sodium: 140 mmol/L (ref 135–145)
Total Protein: 5.3 g/dL — ABNORMAL LOW (ref 6.5–8.1)

## 2016-06-14 LAB — URINALYSIS, ROUTINE W REFLEX MICROSCOPIC
Bilirubin Urine: NEGATIVE
GLUCOSE, UA: NEGATIVE mg/dL
HGB URINE DIPSTICK: NEGATIVE
KETONES UR: 5 mg/dL — AB
LEUKOCYTES UA: NEGATIVE
Nitrite: NEGATIVE
PROTEIN: NEGATIVE mg/dL
Specific Gravity, Urine: 1.026 (ref 1.005–1.030)
pH: 5 (ref 5.0–8.0)

## 2016-06-14 LAB — CBC
HCT: 35.3 % — ABNORMAL LOW (ref 36.0–46.0)
Hemoglobin: 11.8 g/dL — ABNORMAL LOW (ref 12.0–15.0)
MCH: 33.6 pg (ref 26.0–34.0)
MCHC: 33.4 g/dL (ref 30.0–36.0)
MCV: 100.6 fL — AB (ref 78.0–100.0)
PLATELETS: 352 10*3/uL (ref 150–400)
RBC: 3.51 MIL/uL — AB (ref 3.87–5.11)
RDW: 12.3 % (ref 11.5–15.5)
WBC: 9.4 10*3/uL (ref 4.0–10.5)

## 2016-06-14 LAB — I-STAT BETA HCG BLOOD, ED (MC, WL, AP ONLY): I-stat hCG, quantitative: <5 m[IU]/mL (ref <5)

## 2016-06-14 LAB — LIPASE, BLOOD: Lipase: 22 U/L (ref 11–51)

## 2016-06-14 MED ORDER — SODIUM CHLORIDE 0.9 % IV BOLUS (SEPSIS)
1000.0000 mL | Freq: Once | INTRAVENOUS | Status: AC
  Administered 2016-06-14: 1000 mL via INTRAVENOUS

## 2016-06-14 MED ORDER — MORPHINE SULFATE (PF) 4 MG/ML IV SOLN
4.0000 mg | Freq: Once | INTRAVENOUS | Status: AC
  Administered 2016-06-14: 4 mg via INTRAVENOUS
  Filled 2016-06-14: qty 1

## 2016-06-14 MED ORDER — PROMETHAZINE HCL 25 MG/ML IJ SOLN
12.5000 mg | Freq: Once | INTRAMUSCULAR | Status: AC
  Administered 2016-06-14: 12.5 mg via INTRAVENOUS
  Filled 2016-06-14: qty 1

## 2016-06-14 MED ORDER — IOPAMIDOL (ISOVUE-300) INJECTION 61%
INTRAVENOUS | Status: AC
  Administered 2016-06-14: 100 mL
  Filled 2016-06-14: qty 100

## 2016-06-14 NOTE — ED Provider Notes (Signed)
[facility] DEPT Provider Note   CSN: [id number] Arrival date & time: 06/14/16  1718     History   Chief Complaint Chief Complaint  Patient presents with  . Abdominal Pain  . Emesis    HPI Felicia Moore is a 36 y.o. female.  HPI   36 year old female with history of uterine cancer, breast cancer status post lumpectomy, diverticulosis presenting complaining of abdominal pain. Patient report a month ago she was diagnosed with diverticulitis involving her left upper quadrant. She has been on antibiotics including Cipro and Flagyl, last finished a week and half ago but states her pain has not resolved. Continued to endorse intermittent pain to the left upper margin and described as a sharp stabbing sensation, worsening with movement and sometimes improved with hot bath. Pain became more intense last night. She also endorsed associated nausea vomiting and diarrhea. Has had 4-5 bouts of nonbloody nonbilious vomit today and about of loose stool. She also endorsed having fever as high as 102. She contacted her primary care doctor today and was recommended to come to the ER for further evaluation. Patient currently denies having chest pain, shortness of breath, productive cough, dysuria, hematochezia or melena. She denies alcohol abuse. She is a smoker. Prior history of gallbladder surgery.  Past Medical History:  Diagnosis Date  . Breast CA ([place])   . Breast cancer ([place])    left lumpectomy 2006  . Diverticulitis   . Hypertension   . Migraines   . Sciatica   . Stroke [facility]) 2013  . Uterine cancer [facility])     Patient Active Problem List   Diagnosis Date Noted  . Low back pain 09/28/2014  . Essential hypertension 09/28/2014  . Allergic rhinitis 09/28/2014    Past Surgical History:  Procedure Laterality Date  . ABDOMINAL HYSTERECTOMY    . BREAST LUMPECTOMY Left   . CARPAL TUNNEL RELEASE Right   . CHOLECYSTECTOMY      OB History    No data available       Home  Medications    Prior to Admission medications   Medication Sig Start Date End Date Taking? Authorizing Provider  carvedilol (COREG) 6.25 MG tablet Take 1 tablet (6.25 mg total) by mouth 2 (two) times daily with a meal. 10/18/14   Billy Fischer, MD  cephALEXin (KEFLEX) 500 MG capsule Take 1 capsule (500 mg total) by mouth 4 (four) times daily. Patient not taking: Reported on 05/04/2016 06/30/15   Shary Decamp, PA-C  ciprofloxacin (CIPRO) 500 MG tablet Take 1 tablet (500 mg total) by mouth 2 (two) times daily. 05/04/16   Carlisle Cater, PA-C  fluticasone (FLONASE) 50 MCG/ACT nasal spray Place 1 spray into both nostrils 2 (two) times daily. 04/10/16   Historical Provider, MD  gabapentin (NEURONTIN) 400 MG capsule Take 400 mg by mouth 3 (three) times daily. 05/02/16   Historical Provider, MD  HYDROcodone-acetaminophen (NORCO/VICODIN) 5-325 MG tablet Take 1-2 tablets every 6 hours as needed for severe pain 05/04/16   Carlisle Cater, PA-C  ibuprofen (ADVIL,MOTRIN) 200 MG tablet Take 400 mg by mouth every 6 (six) hours as needed for fever or headache (or pain).    Historical Provider, MD  lisinopril-hydrochlorothiazide (PRINZIDE,ZESTORETIC) 20-12.5 MG per tablet Take 1 tablet by mouth daily. 10/18/14   Billy Fischer, MD  metroNIDAZOLE (FLAGYL) 500 MG tablet Take 1 tablet (500 mg total) by mouth 2 (two) times daily. 05/04/16   Carlisle Cater, PA-C  montelukast (SINGULAIR) 10 MG tablet Take 10 mg by  mouth daily.     Historical Provider, MD  PROAIR HFA 108 [phone] Base) MCG/ACT inhaler Inhale 2 puffs into the lungs every 4 (four) hours as needed for wheezing or shortness of breath.  04/26/16   Historical Provider, MD    Family History History reviewed. No pertinent family history.  Social History Social History  Substance Use Topics  . Smoking status: Current Every Day Smoker    Packs/day: 0.50    Years: 20.00    Types: Cigarettes  . Smokeless tobacco: Never Used  . Alcohol use Yes     Comment: rare      Allergies   Reglan [metoclopramide]; Toradol [ketorolac tromethamine]; and Zofran [ondansetron hcl]   Review of Systems Review of Systems  All other systems reviewed and are negative.    Physical Exam Updated Vital Signs BP 115/66 (BP Location: Left Arm)   Pulse (!) 59   Temp 98.1 F (36.7 C) (Oral)   Resp 18   SpO2 100%   Physical Exam  Constitutional: She appears well-developed and well-nourished. No distress.  HENT:  Head: Atraumatic.  Eyes: Conjunctivae are normal.  Neck: Neck supple.  Cardiovascular: Normal rate and regular rhythm.   Pulmonary/Chest: Effort normal and breath sounds normal. She has no wheezes.  Abdominal: Soft. She exhibits no distension. There is tenderness (Tenderness to left upper quadrant on palpation without guarding or rebound tenderness. Negative Murphy sign, no pain at McBurney's point, no peritoneal sign.).  Neurological: She is alert.  Skin: No rash noted.  Psychiatric: She has a normal mood and affect.  Nursing note and vitals reviewed.    ED Treatments / Results  Labs (all labs ordered are listed, but only abnormal results are displayed) Labs Reviewed  COMPREHENSIVE METABOLIC PANEL - Abnormal; Notable for the following:       Result Value   Total Protein 5.3 (*)    Albumin 3.0 (*)    ALT 13 (*)    Total Bilirubin 0.2 (*)    All other components within normal limits  CBC - Abnormal; Notable for the following:    RBC 3.51 (*)    Hemoglobin 11.8 (*)    HCT 35.3 (*)    MCV 100.6 (*)    All other components within normal limits  URINALYSIS, ROUTINE W REFLEX MICROSCOPIC - Abnormal; Notable for the following:    APPearance HAZY (*)    Ketones, ur 5 (*)    All other components within normal limits  LIPASE, BLOOD  I-STAT BETA HCG BLOOD, ED ([facility], [facility], [facility] ONLY)    EKG  EKG Interpretation None       Radiology Ct Abdomen Pelvis W Contrast  Result Date: 06/15/2016 CLINICAL DATA:  Chronic left upper quadrant abdominal pain,  nausea, vomiting and diarrhea. Initial encounter. EXAM: CT ABDOMEN AND PELVIS WITH CONTRAST TECHNIQUE: Multidetector CT imaging of the abdomen and pelvis was performed using the standard protocol following bolus administration of intravenous contrast. CONTRAST:  100 mL ISOVUE-300 IOPAMIDOL (ISOVUE-300) INJECTION 61% COMPARISON:  CT of the abdomen and pelvis performed 05/04/2016 FINDINGS: Lower chest: Minimal bibasilar atelectasis is noted. The visualized portions of the mediastinum are unremarkable. Hepatobiliary: The liver is unremarkable in appearance. The patient is status post cholecystectomy, with clips noted at the gallbladder fossa. The common bile duct remains normal in caliber. Pancreas: The pancreas is within normal limits. Spleen: The spleen is unremarkable in appearance. Adrenals/Urinary Tract: The adrenal glands are unremarkable in appearance. The kidneys are within normal limits. There is no  evidence of hydronephrosis. No renal or ureteral stones are identified. No perinephric stranding is seen. Stomach/Bowel: There is circumferential wall thickening at the antrum of the stomach, measuring up to 2.7 cm in thickness, with minimal associated soft tissue inflammation, and adjacent nodes measuring up to 1.0 cm in short axis. This raises concern for primary gastric malignancy. The small bowel is within normal limits. The appendix is normal in caliber, without evidence of appendicitis. There is question of minimal mucosal edema along the ascending and distal sigmoid colon, which could reflect a residual infectious or inflammatory process. Vascular/Lymphatic: The abdominal aorta is unremarkable in appearance. The inferior vena cava is grossly unremarkable. No retroperitoneal lymphadenopathy is seen. No pelvic sidewall lymphadenopathy is identified. Reproductive: The bladder is mildly distended and grossly unremarkable. The patient is status post hysterectomy. No suspicious adnexal masses are seen. Other: No  additional soft tissue abnormalities are seen. Musculoskeletal: No acute osseous abnormalities are identified. The visualized musculature is unremarkable in appearance. IMPRESSION: 1. Circumferential wall thickening at the antrum of the stomach, measuring up to 2.7 cm in thickness, with minimal associated soft tissue inflammation, and adjacent nodes measuring up to 1.0 cm in short axis. This is highly suspicious for primary gastric malignancy, somewhat more prominent than on the prior study. Endoscopy is recommended for further evaluation. 2. Question of minimal mucosal edema along the ascending and distal sigmoid colon, which could reflect a residual infectious or inflammatory process, as previously noted. These results were called by telephone at the time of interpretation on 06/15/2016 at 12:02 am to [facility] PA, who verbally acknowledged these results. Electronically Signed   By: Garald Balding M.D.   On: 06/15/2016 00:05    Procedures Procedures (including critical care time)  Medications Ordered in ED Medications  oxyCODONE-acetaminophen (PERCOCET/ROXICET) 5-325 MG per tablet 1 tablet (not administered)  promethazine (PHENERGAN) injection 12.5 mg (12.5 mg Intravenous Given 06/14/16 2319)  sodium chloride 0.9 % bolus 1,000 mL (1,000 mLs Intravenous New Bag/Given 06/14/16 2323)  morphine 4 MG/ML injection 4 mg (4 mg Intravenous Given 06/14/16 2319)  iopamidol (ISOVUE-300) 61 % injection (100 mLs  Contrast Given 06/14/16 2332)     Initial Impression / Assessment and Plan / ED Course  I have reviewed the triage vital signs and the nursing notes.  Pertinent labs & imaging results that were available during my care of the patient were reviewed by me and considered in my medical decision making (see chart for details).  Clinical Course     BP 120/73   Pulse 65   Temp 98.1 F (36.7 C) (Oral)   Resp 18   SpO2 99%    Final Clinical Impressions(s) / ED Diagnoses   Final diagnoses:  Nausea  vomiting and diarrhea  Malignant neoplasm of stomach, unspecified location [facility])    New Prescriptions New Prescriptions   OXYCODONE-ACETAMINOPHEN (PERCOCET/ROXICET) 5-325 MG TABLET    Take 1 tablet by mouth every 6 (six) hours as needed for moderate pain or severe pain.   PROMETHAZINE (PHENERGAN) 25 MG TABLET    Take 1 tablet (25 mg total) by mouth every 6 (six) hours as needed for nausea.   10:53 PM Patient report unresolved left upper quadrant abdominal pain despite being treated for diverticulitis. The pain since then location. Also endorsed nausea vomiting diarrhea. She is currently afebrile with stable normal vital sign and has a nonsurgical abdomen. Will repeat an abdominal and pelvis CT scan further evaluation. History of cholecystectomy. No history of alcohol abuse or diabetes  to suggest pancreatitis.  12:13 AM Labs are reassuring including normal UA, negative pregnancy test, normal lipase, normal comprehensive metabolic panel, and normal WBC. However, abdominal and pelvis CT scan demonstrate wall thickening at the antrum of the stomach highly suspicious for primary gastric malignancy. Patient did have remote history of breast cancer and uterine cancer.  12:26 AM Patient made aware of CT scan finding. Her last oncologist or in [place]. She does have a primary care provider that she can follow-up closely to help set up close f/u with oncologist.  Pt understand she can return if her sxs worsen or if she cannot establish care with oncologist.  Will d/c with narcotic pain medication and antinausea medication for sxs treatment of her newly diagnosed gastric cancer.  Care discussed with Dr. Claudine Mouton.   Domenic Moras, PA-C 06/15/16 [id number]    Everlene Balls, MD 06/23/16 [phone]

## 2016-06-14 NOTE — ED Triage Notes (Signed)
Pt reports sudden onset this am of LUQ pain with n/v/d and fever.

## 2016-06-15 MED ORDER — OXYCODONE-ACETAMINOPHEN 5-325 MG PO TABS
1.0000 | ORAL_TABLET | Freq: Once | ORAL | Status: AC
  Administered 2016-06-15: 1 via ORAL
  Filled 2016-06-15: qty 1

## 2016-06-15 MED ORDER — PROMETHAZINE HCL 25 MG PO TABS: 25.0000 mg | ORAL_TABLET | Freq: Four times a day (QID) | ORAL | 0 refills | Status: DC | PRN

## 2016-06-15 MED ORDER — OXYCODONE-ACETAMINOPHEN 5-325 MG PO TABS: 1.0000 | ORAL_TABLET | Freq: Four times a day (QID) | ORAL | 0 refills | Status: DC | PRN

## 2016-06-15 NOTE — ED Notes (Signed)
Patient verbalized understanding of discharge instructions and denies any further needs or questions at this time. VS stable. Patient ambulatory with steady gait.  

## 2016-06-15 NOTE — Discharge Instructions (Signed)
Please follow up closely with your primary care provider for further care of your pain and discomfort.  Your CT scan today showing sign of changes in your stomach concerning for primary gastric cancer.  You will need to follow up with an oncologist for further care.  Take percocet and Phenergan as needed.  Return to the ER if you have any concerns.

## 2016-06-16 ENCOUNTER — Encounter: Payer: Self-pay | Admitting: Physician Assistant

## 2016-06-26 ENCOUNTER — Telehealth: Payer: Self-pay | Admitting: *Deleted

## 2016-06-26 ENCOUNTER — Ambulatory Visit (INDEPENDENT_AMBULATORY_CARE_PROVIDER_SITE_OTHER): Payer: Medicaid Other | Admitting: Physician Assistant

## 2016-06-26 ENCOUNTER — Encounter: Payer: Self-pay | Admitting: Physician Assistant

## 2016-06-26 VITALS — BP 132/76 | HR 88 | Ht 63.0 in | Wt 144.0 lb

## 2016-06-26 DIAGNOSIS — R197 Diarrhea, unspecified: Secondary | ICD-10-CM | POA: Diagnosis not present

## 2016-06-26 DIAGNOSIS — R935 Abnormal findings on diagnostic imaging of other abdominal regions, including retroperitoneum: Secondary | ICD-10-CM

## 2016-06-26 DIAGNOSIS — R112 Nausea with vomiting, unspecified: Secondary | ICD-10-CM | POA: Diagnosis not present

## 2016-06-26 DIAGNOSIS — R109 Unspecified abdominal pain: Secondary | ICD-10-CM | POA: Diagnosis not present

## 2016-06-26 DIAGNOSIS — Z853 Personal history of malignant neoplasm of breast: Secondary | ICD-10-CM

## 2016-06-26 DIAGNOSIS — R634 Abnormal weight loss: Secondary | ICD-10-CM | POA: Diagnosis not present

## 2016-06-26 MED ORDER — NA SULFATE-K SULFATE-MG SULF 17.5-3.13-1.6 GM/177ML PO SOLN: 1.0000 | ORAL | 0 refills | Status: DC

## 2016-06-26 NOTE — Progress Notes (Signed)
Chief Complaint: Abdominal pain  HPI:  Felicia Moore is a 36 year old Caucasian female with a past medical history significant for uterine cancer, breast cancer status post lumpectomy and diverticulosis, who was referred to me by the ER for abdominal pain.    Per review of chart patient was recently seen in the ER on 06/14/16 and at that time described recently being diagnosed with diverticulitis in her left upper quadrant s month previously, she had been on antibiotics including Cipro and Flagyl and finished a week and a half ago and stated that her pain did not resolve. She described a sharp stabbing sensation worse with movement and sometimes improved with a hot bath. She also endorsed associated nausea vomiting and diarrhea. She also described fevers as high as 102. Patient had CT the abdomen and pelvis with contrast on that day which did show circumferential wall thickening at the antrum of the stomach, measuring up to 2.7 cm in thickness with minimal associated soft tissue inflammation and adjacent nodes measuring up to 1 cm in the short axis. This was highly suspicious for primary gastric malignancy, somewhat more prominent than on the prior study. Endoscopy was recommended. There was also question of minimal mucosal edema along the descending and distal sigmoid colon which could reflect a residual infectious or inflammatory process as previously noted.   Today, the patient presents to clinic accompanied by her husband who does assist with her history. She describes that 3 months ago she started with a left upper quadrant abdominal pain which she thought was just "my diverticulitis again", apparently she self treated at home for a few weeks/months with antibiotics, but this pain never fully went away. This pain was sharp in nature and associated with an increase in bowel movements. The patient tells me that she has at least 3 "looser" bowel movements per day but they're "not water" and don't feel like  "C. difficile". Patient also describes a history of C. difficile 3 years ago and this is "nothing like that". Patient then went to the ER was placed on antibiotics for 2 weeks, Cipro and Flagyl. She tells me this helped with her left upper quadrant pain but she has continued with epigastric abdominal pain. This feels as though she is constantly being "punched in the stomach". This is accompanied by some heartburn and reflux symptoms which are new for her as well as bloating which makes her "9 months pregnant" after eating anything. She has also has constant nausea and vomiting episodes over the past 3 months. Phenergan does help. Associated symptoms include extreme fatigue and a weight loss of 25 pounds in the past 2 months.   Patient does have history of breast cancer and lumpectomy as well as radiation more than 10 years ago. She also had a preventative hysterectomy after finding of a few "abnormal cells" in her uterus, this was 3 years ago.   Patient does admit to extreme Goody powder use. She tells me that she was taking at least 4-5 Goody powders a day for headaches that she was experiencing over the past few years, recently since starting with epigastric pain she has decreased this to maybe "one a day".   Patient denies fever, chills, blood in her stool, melena or dysphagia.  Past Medical History:  Diagnosis Date  . Breast CA ([place])   . Breast cancer ([place])    left lumpectomy 2006  . Diverticulitis   . Hypertension   . Migraines   . Sciatica   . Stroke (  [employer]) 2013  . Uterine cancer [facility])     Past Surgical History:  Procedure Laterality Date  . ABDOMINAL HYSTERECTOMY    . BREAST LUMPECTOMY Left   . CARPAL TUNNEL RELEASE Right   . CHOLECYSTECTOMY      Current Outpatient Prescriptions  Medication Sig Dispense Refill  . carvedilol (COREG) 6.25 MG tablet Take 1 tablet (6.25 mg total) by mouth 2 (two) times daily with a meal. 60 tablet 0  . fluticasone (FLONASE) 50 MCG/ACT nasal spray Place  1 spray into both nostrils 2 (two) times daily.  2  . gabapentin (NEURONTIN) 400 MG capsule Take 400 mg by mouth 3 (three) times daily.  2  . ibuprofen (ADVIL,MOTRIN) 200 MG tablet Take 400 mg by mouth every 6 (six) hours as needed for fever or headache (or pain).    Marland Kitchen lisinopril-hydrochlorothiazide (PRINZIDE,ZESTORETIC) 20-12.5 MG per tablet Take 1 tablet by mouth daily. 30 tablet 0  . metroNIDAZOLE (FLAGYL) 500 MG tablet Take 1 tablet (500 mg total) by mouth 2 (two) times daily. 14 tablet 0  . montelukast (SINGULAIR) 10 MG tablet Take 10 mg by mouth daily.     Marland Kitchen oxyCODONE-acetaminophen (PERCOCET/ROXICET) 5-325 MG tablet Take 1 tablet by mouth every 6 (six) hours as needed for moderate pain or severe pain. 15 tablet 0  . PROAIR HFA 108 (90 Base) MCG/ACT inhaler Inhale 2 puffs into the lungs every 4 (four) hours as needed for wheezing or shortness of breath.   11  . promethazine (PHENERGAN) 25 MG tablet Take 1 tablet (25 mg total) by mouth every 6 (six) hours as needed for nausea. 20 tablet 0   No current facility-administered medications for this visit.     Allergies as of 06/26/2016 - Review Complete 06/26/2016  Allergen Reaction Noted  . Reglan [metoclopramide] Anaphylaxis, Hives, and Swelling 09/05/2014  . Toradol [ketorolac tromethamine] Anaphylaxis, Hives, and Swelling 09/05/2014  . Zofran [ondansetron hcl] Anaphylaxis, Hives, and Swelling 09/05/2014   Family history: No family history of colon cancer or polyps.  Social History   Social History  . Marital status: Married    Spouse name: N/A  . Number of children: N/A  . Years of education: N/A   Occupational History  . Not on file.   Social History Main Topics  . Smoking status: Current Every Day Smoker    Packs/day: 0.50    Years: 20.00    Types: Cigarettes  . Smokeless tobacco: Never Used  . Alcohol use Yes     Comment: rare  . Drug use: No  . Sexual activity: Yes    Birth control/ protection: Surgical   Other  Topics Concern  . Not on file   Social History Narrative  . No narrative on file    Review of Systems:    Constitutional: Positive for weight loss and fatigue No weight loss, fever, chills, weakness or fatigue Skin: No rash  Cardiovascular: No chest pain Respiratory: No SOB Gastrointestinal: See HPI and otherwise negative Genitourinary: No dysuria or change in urinary frequency Neurological: Positive for frequent headaches  Musculoskeletal: No new muscle or joint pain Hematologic: No bleeding or bruising Psychiatric: Positive for depression    Physical Exam:  Vital signs: BP 132/76   Pulse 88   Ht 5\' 3"  (1.6 m)   Wt 144 lb (65.3 kg)   BMI 25.51 kg/m   Constitutional:   Pleasant Caucasian female appears to be in NAD, Well developed, Well nourished, alert and cooperative Head:  Normocephalic and atraumatic.  Eyes:   PEERL, EOMI. No icterus. Conjunctiva pink. Ears:  Normal auditory acuity. Neck:  Supple Throat: Oral cavity and pharynx without inflammation, swelling or lesion.  Respiratory: Respirations even and unlabored. Lungs clear to auscultation bilaterally.   No wheezes, crackles, or rhonchi.  Cardiovascular: Normal S1, S2. No MRG. Regular rate and rhythm. No peripheral edema, cyanosis or pallor.  Gastrointestinal:  Soft, nondistended, Moderate tenderness in the epigastrium, mild tenderness left upper quadrant No rebound or guarding. Normal bowel sounds. No appreciable masses or hepatomegaly. Rectal:  Not performed.  Msk:  Symmetrical without gross deformities. Without edema, no deformity or joint abnormality.  Neurologic:  Alert and  oriented x4;  grossly normal neurologically.  Skin:   Dry and intact without significant lesions or rashes. Psychiatric: Demonstrates good judgement and reason without abnormal affect or behaviors.  RELEVANT LABS AND IMAGING: CBC    Component Value Date/Time   WBC 9.4 06/14/2016 1754   RBC 3.51 (L) 06/14/2016 1754   HGB 11.8 (L)  06/14/2016 1754   HCT 35.3 (L) 06/14/2016 1754   PLT 352 06/14/2016 1754   MCV 100.6 (H) 06/14/2016 1754   MCH 33.6 06/14/2016 1754   MCHC 33.4 06/14/2016 1754   RDW 12.3 06/14/2016 1754   LYMPHSABS 0.4 (L) 01/16/2016 1100   MONOABS 0.4 01/16/2016 1100   EOSABS 0.0 01/16/2016 1100   BASOSABS 0.0 01/16/2016 1100    CMP     Component Value Date/Time   NA 140 06/14/2016 1754   K 3.6 06/14/2016 1754   CL 105 06/14/2016 1754   CO2 28 06/14/2016 1754   GLUCOSE 92 06/14/2016 1754   BUN 10 06/14/2016 1754   CREATININE 0.73 06/14/2016 1754   CALCIUM 9.0 06/14/2016 1754   PROT 5.3 (L) 06/14/2016 1754   ALBUMIN 3.0 (L) 06/14/2016 1754   AST 17 06/14/2016 1754   ALT 13 (L) 06/14/2016 1754   ALKPHOS 46 06/14/2016 1754   BILITOT 0.2 (L) 06/14/2016 1754   GFRNONAA >60 06/14/2016 1754   GFRAA >60 06/14/2016 1754   Result Date: 06/15/2016 CLINICAL DATA:  Chronic left upper quadrant abdominal pain, nausea, vomiting and diarrhea. Initial encounter. EXAM: CT ABDOMEN AND PELVIS WITH CONTRAST TECHNIQUE: Multidetector CT imaging of the abdomen and pelvis was performed using the standard protocol following bolus administration of intravenous contrast. CONTRAST:  100 mL ISOVUE-300 IOPAMIDOL (ISOVUE-300) INJECTION 61% COMPARISON:  CT of the abdomen and pelvis performed 05/04/2016 FINDINGS: Lower chest: Minimal bibasilar atelectasis is noted. The visualized portions of the mediastinum are unremarkable. Hepatobiliary: The liver is unremarkable in appearance. The patient is status post cholecystectomy, with clips noted at the gallbladder fossa. The common bile duct remains normal in caliber. Pancreas: The pancreas is within normal limits. Spleen: The spleen is unremarkable in appearance. Adrenals/Urinary Tract: The adrenal glands are unremarkable in appearance. The kidneys are within normal limits. There is no evidence of hydronephrosis. No renal or ureteral stones are identified. No perinephric stranding is  seen. Stomach/Bowel: There is circumferential wall thickening at the antrum of the stomach, measuring up to 2.7 cm in thickness, with minimal associated soft tissue inflammation, and adjacent nodes measuring up to 1.0 cm in short axis. This raises concern for primary gastric malignancy. The small bowel is within normal limits. The appendix is normal in caliber, without evidence of appendicitis. There is question of minimal mucosal edema along the ascending and distal sigmoid colon, which could reflect a residual infectious or inflammatory process. Vascular/Lymphatic: The abdominal aorta is unremarkable in appearance. The  inferior vena cava is grossly unremarkable. No retroperitoneal lymphadenopathy is seen. No pelvic sidewall lymphadenopathy is identified. Reproductive: The bladder is mildly distended and grossly unremarkable. The patient is status post hysterectomy. No suspicious adnexal masses are seen. Other: No additional soft tissue abnormalities are seen. Musculoskeletal: No acute osseous abnormalities are identified. The visualized musculature is unremarkable in appearance. IMPRESSION: 1. Circumferential wall thickening at the antrum of the stomach, measuring up to 2.7 cm in thickness, with minimal associated soft tissue inflammation, and adjacent nodes measuring up to 1.0 cm in short axis. This is highly suspicious for primary gastric malignancy, somewhat more prominent than on the prior study. Endoscopy is recommended for further evaluation. 2. Question of minimal mucosal edema along the ascending and distal sigmoid colon, which could reflect a residual infectious or inflammatory process, as previously noted. These results were called by telephone at the time of interpretation on 06/15/2016 at 12:02 am to [facility] PA, who verbally acknowledged these results. Electronically Signed   By: Garald Balding M.D.   On: 06/15/2016 00:05    Assessment: 1. Abnormal CT of the abdomen and pelvis: See above,  circumferential wall thickening at the antrum of the stomach as well as question of mucosal edema along the ascending and distal sigmoid colon; consider cancer vs other 2. Nausea and vomiting: Likely with above 3. Abdominal pain: Likely associated to diverticulitis and gastritis 4. Diarrhea: For the past 3 months, started with a left upper quadrant pain, patient associated this with diverticulitis, continues with 3 looser bowel movements per day; consider relation to colitis versus other 5. Weight loss: Total of 25 pounds in the past 2-3 months 6. History of breast cancer and uterine cancer: Breast cancer greater than 10 years ago with lumpectomy and radiation, and hysterectomy for abnormal cells in her uterus 3 years ago  Plan: 1. Patient recently had an abnormal CT of the abdomen and pelvis as above showing thickening of the antrum as well as the sigmoid colon. Recommend further evaluation with an EGD and colonoscopy. We did discuss risks, benefits, complications and alternatives and the patient agrees to proceed. This was scheduled tomorrow, 06/27/16 with Dr. Silverio Decamp in the [facility]. 2. Patient to continue Phenergan when necessary for nausea and vomiting. 3. Patient to follow clinic per Dr. Woodward Ku recommendations after time of procedure. Did discuss this case with Dr. Silverio Decamp at time of patient's visit today.  Ellouise Newer, PA-C [facility] 06/26/2016, 9:23 AM  Cc: Clinic, General Medical

## 2016-06-26 NOTE — Telephone Encounter (Signed)
Oncology Nurse Navigator Documentation  Oncology Nurse Navigator Flowsheets 06/26/2016  Navigator Location [facility]  Referral date to RadOnc/MedOnc 06/19/2016  Navigator Encounter Type Introductory phone call  Abnormal Finding Date 06/14/2016  Spoke with patient and provided new patient appointment for 06/30/16 AT 1:00/1:15 PM with Dr. Benay Spice. Informed of location of [place], valet service, and registration process. Reminded to bring photo ID, insurance cards and a current medication list, including supplements. Patient verbalizes understanding. Notified HIM of appointment.

## 2016-06-26 NOTE — Patient Instructions (Signed)

## 2016-06-27 ENCOUNTER — Encounter: Payer: Self-pay | Admitting: Gastroenterology

## 2016-06-27 ENCOUNTER — Other Ambulatory Visit: Payer: Self-pay | Admitting: Gastroenterology

## 2016-06-27 ENCOUNTER — Ambulatory Visit (AMBULATORY_SURGERY_CENTER): Payer: Medicaid Other | Admitting: Gastroenterology

## 2016-06-27 VITALS — BP 111/58 | HR 78 | Temp 98.6°F | Resp 14 | Ht 63.0 in | Wt 144.0 lb

## 2016-06-27 DIAGNOSIS — K253 Acute gastric ulcer without hemorrhage or perforation: Secondary | ICD-10-CM | POA: Diagnosis not present

## 2016-06-27 DIAGNOSIS — K621 Rectal polyp: Secondary | ICD-10-CM | POA: Diagnosis not present

## 2016-06-27 DIAGNOSIS — R9389 Abnormal findings on diagnostic imaging of other specified body structures: Secondary | ICD-10-CM

## 2016-06-27 DIAGNOSIS — R933 Abnormal findings on diagnostic imaging of other parts of digestive tract: Secondary | ICD-10-CM

## 2016-06-27 DIAGNOSIS — D128 Benign neoplasm of rectum: Secondary | ICD-10-CM

## 2016-06-27 MED ORDER — SODIUM CHLORIDE 0.9 % IV SOLN: 500.0000 mL | INTRAVENOUS | Status: DC

## 2016-06-27 MED ORDER — OMEPRAZOLE 40 MG PO CPDR: 40.0000 mg | DELAYED_RELEASE_CAPSULE | Freq: Two times a day (BID) | ORAL | 11 refills | Status: DC

## 2016-06-27 NOTE — Patient Instructions (Addendum)
YOU HAD AN ENDOSCOPIC PROCEDURE TODAY AT [facility]:   Refer to the procedure report that was given to you for any specific questions about what was found during the examination.  If the procedure report does not answer your questions, please call your gastroenterologist to clarify.  If you requested that your care partner not be given the details of your procedure findings, then the procedure report has been included in a sealed envelope for you to review at your convenience later.  YOU SHOULD EXPECT: Some feelings of bloating in the abdomen. Passage of more gas than usual.  Walking can help get rid of the air that was put into your GI tract during the procedure and reduce the bloating. If you had a lower endoscopy (such as a colonoscopy or flexible sigmoidoscopy) you may notice spotting of blood in your stool or on the toilet paper. If you underwent a bowel prep for your procedure, you may not have a normal bowel movement for a few days.  Please Note:  You might notice some irritation and congestion in your nose or some drainage.  This is from the oxygen used during your procedure.  There is no need for concern and it should clear up in a day or so.  SYMPTOMS TO REPORT IMMEDIATELY:   Following lower endoscopy (colonoscopy or flexible sigmoidoscopy):  Excessive amounts of blood in the stool  Significant tenderness or worsening of abdominal pains  Swelling of the abdomen that is new, acute  Fever of 100F or higher   Following upper endoscopy (EGD)  Vomiting of blood or coffee ground material  New chest pain or pain under the shoulder blades  Painful or persistently difficult swallowing  New shortness of breath  Fever of 100F or higher  Black, tarry-looking stools  For urgent or emergent issues, a gastroenterologist can be reached at any hour by calling [phone].   DIET:  We do recommend a small meal at first, but then you may proceed to your regular diet.  Drink  plenty of fluids but you should avoid alcoholic beverages for 24 hours.  ACTIVITY:  You should plan to take it easy for the rest of today and you should NOT DRIVE or use heavy machinery until tomorrow (because of the sedation medicines used during the test).    FOLLOW UP: Our staff will call the number listed on your records the next business day following your procedure to check on you and address any questions or concerns that you may have regarding the information given to you following your procedure. If we do not reach you, we will leave a message.  However, if you are feeling well and you are not experiencing any problems, there is no need to return our call.  We will assume that you have returned to your regular daily activities without incident.  If any biopsies were taken you will be contacted by phone or by letter within the next 1-3 weeks.  Please call us at [phone] if you have not heard about the biopsies in 3 weeks.    SIGNATURES/CONFIDENTIALITY: You and/or your care partner have signed paperwork which will be entered into your electronic medical record.  These signatures attest to the fact that that the information above on your After Visit Summary has been reviewed and is understood.  Full responsibility of the confidentiality of this discharge information lies with you and/or your care-partner.    Handouts were given to your care partner on gastritis,  esophagitis, polyps, and diverticulosis. Rx was sent to [facility] for OMEPRAZOLE 40 mg take two times daily. No aspirin, aspirin products,  ibuprofen, naproxen, advil, motrin, aleve, or other non-steroidal anti-inflammatory drugs. You may resume your other current medications today. Await biopsy results. Dr. Jethro Bastos nurse will call to set you up for another EGD to check for ulcer healing. Please call if any questions or concerns.

## 2016-06-27 NOTE — Progress Notes (Signed)
Pt was tearful when she was waking up from sedation.  Pt stated she was worried about her results.  I reassured pt that Dr. Silverio Decamp would be in shortly to go over her results.  I also encouraged to to pass flatus.  Pt had reservations about passing gas.  I explained why she was full of gas.  Pt did pass gas several times.  maw

## 2016-06-27 NOTE — Progress Notes (Signed)
Pt reported her ride canceled on her.  She said she needed to ride the bus.  Fara Chute, transporter, reported Maye Hides, RN siad ok to take pt to the fonrt door in a wheelchair as long as she has a care partner with her.  Pt was take to front door.  No problems noted in the recovery room. maw

## 2016-06-27 NOTE — Progress Notes (Signed)
Called to room to assist during endoscopic procedure.  Patient ID and intended procedure confirmed with present staff. Received instructions for my participation in the procedure from the performing physician.  

## 2016-06-27 NOTE — Op Note (Signed)
[facility] Patient Name: Felicia Moore Procedure Date: 06/27/2016 3:30 PM MRN: [id number] Endoscopist: Mauri Pole , MD Age: 36 Referring MD:  Date of Birth: December 05, 1980 Gender: Female Account #: [id number] Procedure:                Upper GI endoscopy Indications:              Endoscopy to confirm suspected neoplastic lesion of                            the stomach seen on previous imaging study Medicines:                Monitored Anesthesia Care Procedure:                Pre-Anesthesia Assessment:                           - Prior to the procedure, a History and Physical                            was performed, and patient medications and                            allergies were reviewed. The patient's tolerance of                            previous anesthesia was also reviewed. The risks                            and benefits of the procedure and the sedation                            options and risks were discussed with the patient.                            All questions were answered, and informed consent                            was obtained. Prior Anticoagulants: The patient has                            taken no previous anticoagulant or antiplatelet                            agents. ASA Grade Assessment: III - A patient with                            severe systemic disease. After reviewing the risks                            and benefits, the patient was deemed in                            satisfactory condition to undergo the procedure.  After obtaining informed consent, the endoscope was                            passed under direct vision. Throughout the                            procedure, the patient's blood pressure, pulse, and                            oxygen saturations were monitored continuously. The                            Model GIF-[id number]) scope was introduced   through the mouth, and advanced to the second part                            of duodenum. The upper GI endoscopy was                            accomplished without difficulty. The patient                            tolerated the procedure well. Scope In: Scope Out: Findings:                 Two superficial esophageal ulcers with no bleeding                            and stigmata of recent bleeding were found 36 to 38                            cm from the incisors. The largest lesion was 3 mm                            in largest dimension.                           LA Grade D (one or more mucosal breaks involving at                            least 75% of esophageal circumference) esophagitis                            with no bleeding was found 34 to 38 cm from the                            incisors.                           One non-bleeding cratered gastric ulcer with no                            stigmata of bleeding was found in the prepyloric  region of the stomach. The lesion was 12 mm in                            largest dimension. Biopsies were taken with a cold                            forceps for histology.                           Scattered severe inflammation with hemorrhage                            characterized by adherent blood, congestion                            (edema), erosions, erythema, friability and linear                            erosions was found in the entire examined stomach.                            Biopsies were taken with a cold forceps for                            Helicobacter pylori testing using CLOtest.                           The examined duodenum was normal. Complications:            No immediate complications. Estimated Blood Loss:     Estimated blood loss was minimal. Impression:               - Non-bleeding esophageal ulcers.                           - LA Grade D erosive esophagitis.                            - Non-bleeding gastric ulcer with no stigmata of                            bleeding. Biopsied.                           - Gastritis with hemorrhage. Biopsied.                           - Normal examined duodenum. Recommendation:           - Patient has a contact number available for                            emergencies. The signs and symptoms of potential                            delayed complications were discussed with the  patient. Return to normal activities tomorrow.                            Written discharge instructions were provided to the                            patient.                           - Resume previous diet.                           - Continue present medications.                           - No aspirin, ibuprofen, naproxen, or other                            non-steroidal anti-inflammatory drugs.                           - Await pathology results.                           - Repeat upper endoscopy in 3 months to check                            healing.                           - Return to GI office PRN.                           - Use Prilosec (omeprazole) 40 mg PO BID. Mauri Pole, MD 06/27/2016 4:14:48 PM This report has been signed electronically.

## 2016-06-27 NOTE — Progress Notes (Signed)
Dr.Nandigam came to me after speaking with pt. In recovery to inform me that she wants the specimen sent rush stated pt. Was concerned about it. Specimen removed from routine path bag and prepared for [facility] notified for pick up.[id number] [facility] in to pick up specimen.

## 2016-06-27 NOTE — Op Note (Signed)
[facility] Patient Name: Felicia Moore Procedure Date: 06/27/2016 3:30 PM MRN: [id number] Endoscopist: Mauri Pole , MD Age: 36 Referring MD:  Date of Birth: [birth year] Gender: Female Account #: [id number] Procedure:                Colonoscopy Indications:              Abnormal CT of the GI tract, Follow-up of                            diverticulitis Medicines:                Monitored Anesthesia Care Procedure:                Pre-Anesthesia Assessment:                           - Prior to the procedure, a History and Physical                            was performed, and patient medications and                            allergies were reviewed. The patient's tolerance of                            previous anesthesia was also reviewed. The risks                            and benefits of the procedure and the sedation                            options and risks were discussed with the patient.                            All questions were answered, and informed consent                            was obtained. Prior Anticoagulants: The patient has                            taken no previous anticoagulant or antiplatelet                            agents. ASA Grade Assessment: III - A patient with                            severe systemic disease. After reviewing the risks                            and benefits, the patient was deemed in                            satisfactory condition to undergo the procedure.  After obtaining informed consent, the colonoscope                            was passed under direct vision. Throughout the                            procedure, the patient's blood pressure, pulse, and                            oxygen saturations were monitored continuously. The                            Model PCF-H190L [id number]) scope was introduced                            through the anus and advanced to the the  cecum,                            identified by appendiceal orifice and ileocecal                            valve. The colonoscopy was performed without                            difficulty. The patient tolerated the procedure                            well. The quality of the bowel preparation was                            adequate to identify polyps 6 mm and larger in                            size, extensive bile stain in the right colon                            precluding visualization for flat lesions. The                            ileocecal valve, appendiceal orifice, and rectum                            were photographed. Scope In: 3:42:46 PM Scope Out: 4:05:10 PM Scope Withdrawal Time: 0 hours 9 minutes 37 seconds  Total Procedure Duration: 0 hours 22 minutes 24 seconds  Findings:                 The perianal and digital rectal examinations were                            normal.                           A 2 mm polyp was found in the rectum. The polyp was  sessile. The polyp was removed with a cold biopsy                            forceps. Resection and retrieval were complete.                           A single (solitary) two mm ulcer was found in the                            ascending colon. No bleeding was present.                           Scattered small-mouthed diverticula were found in                            the sigmoid colon, descending colon and transverse                            colon. There was no evidence of diverticular                            bleeding.                           The exam was otherwise without abnormality. Complications:            No immediate complications. Estimated Blood Loss:     Estimated blood loss was minimal. Impression:               - One 2 mm polyp in the rectum, removed with a cold                            biopsy forceps. Resected and retrieved.                           - A single  (solitary) ulcer in the ascending colon.                           - Mild diverticulosis in the sigmoid colon, in the                            descending colon and in the transverse colon. There                            was no evidence of diverticular bleeding.                           - The examination was otherwise normal. Recommendation:           - Patient has a contact number available for                            emergencies. The signs and symptoms of potential  delayed complications were discussed with the                            patient. Return to normal activities tomorrow.                            Written discharge instructions were provided to the                            patient.                           - Resume previous diet.                           - Continue present medications.                           - Await pathology results.                           - Repeat colonoscopy for surveillance based on                            pathology results.                           - Return to GI clinic PRN. Mauri Pole, MD 06/27/2016 4:19:47 PM This report has been signed electronically.

## 2016-06-27 NOTE — Progress Notes (Signed)
Dr. Silverio Decamp want pt to be set up for EGD in 3 months to check for ulcer healing.  Per Dr. Silverio Decamp, she will talk with [facility] about setting up procedure.  She wanted to get path back. maw

## 2016-06-27 NOTE — Progress Notes (Signed)
Report to PACU, RN, vss, BBS= Clear.  

## 2016-06-28 ENCOUNTER — Telehealth: Payer: Self-pay

## 2016-06-28 NOTE — Telephone Encounter (Signed)
  Follow up Call-  Call back number 06/27/2016  Post procedure Call Back phone  # [phone]  Permission to leave phone message Yes     Patient questions:  Do you have a fever, pain , or abdominal swelling? No. Pain Score  0 *  Have you tolerated food without any problems? Yes.    Have you been able to return to your normal activities? Yes.    Do you have any questions about your discharge instructions: Diet   No. Medications  No. Follow up visit  No.  Do you have questions or concerns about your Care? No.  Actions: * If pain score is 4 or above: No action needed, pain <4.

## 2016-06-29 ENCOUNTER — Telehealth: Payer: Self-pay | Admitting: *Deleted

## 2016-06-29 ENCOUNTER — Other Ambulatory Visit: Payer: Self-pay | Admitting: *Deleted

## 2016-06-29 NOTE — Telephone Encounter (Signed)
Please see note.

## 2016-06-29 NOTE — Telephone Encounter (Signed)
Patient called to request change in her new patient appointment for 1/26 with Dr. Benay Spice. She has pain management appointment at the same time and they will not change it. Made her aware that her biopsy came back negative for cancer, so she does not need to see oncology (was going to wait to allow GI to call her with these results). Suggested she see genetics counselor and she agrees to this. The endo reports and path were sent to Dr. Ailene Rud office as well. Forwarded request to set up with genetics to Seth Bake in [facility] and she will call patient. Will make GI aware of conversation with patient today.

## 2016-06-29 NOTE — Addendum Note (Signed)
Addended by: Marlou Starks on: 06/29/2016 04:49 PM   Modules accepted: Orders

## 2016-06-30 ENCOUNTER — Ambulatory Visit: Payer: Medicaid Other | Admitting: Oncology

## 2016-06-30 NOTE — Progress Notes (Signed)
Reviewed and agree with documentation and assessment and plan. K. Veena Aslan Himes , MD   

## 2016-06-30 NOTE — Telephone Encounter (Signed)
Ok, she need surveillance EGD in 3 months. Thanks

## 2016-07-03 ENCOUNTER — Telehealth: Payer: Self-pay | Admitting: Genetic Counselor

## 2016-07-03 NOTE — Telephone Encounter (Signed)
Appt has been scheduled for genetics appt on 2/6 at 3pm. Pt aware to arrive 30 minutes early.Demographics verified.

## 2016-07-04 ENCOUNTER — Other Ambulatory Visit: Payer: Medicaid Other

## 2016-07-05 ENCOUNTER — Other Ambulatory Visit (INDEPENDENT_AMBULATORY_CARE_PROVIDER_SITE_OTHER): Payer: Medicaid Other

## 2016-07-05 DIAGNOSIS — R9389 Abnormal findings on diagnostic imaging of other specified body structures: Secondary | ICD-10-CM

## 2016-07-05 DIAGNOSIS — R938 Abnormal findings on diagnostic imaging of other specified body structures: Secondary | ICD-10-CM

## 2016-07-05 LAB — HELICOBACTER PYLORI SCREEN-BIOPSY: UREASE: NEGATIVE

## 2016-07-10 ENCOUNTER — Encounter: Payer: Self-pay | Admitting: Gastroenterology

## 2016-07-11 ENCOUNTER — Other Ambulatory Visit: Payer: Medicaid Other

## 2016-07-11 ENCOUNTER — Telehealth: Payer: Self-pay | Admitting: Genetic Counselor

## 2016-07-11 NOTE — Telephone Encounter (Signed)
Pt called in to reschedule due to having the flu. 02/19

## 2016-07-24 ENCOUNTER — Other Ambulatory Visit: Payer: Medicaid Other

## 2016-08-11 ENCOUNTER — Encounter (HOSPITAL_COMMUNITY): Payer: Self-pay | Admitting: Emergency Medicine

## 2016-08-11 ENCOUNTER — Emergency Department (HOSPITAL_COMMUNITY)
Admission: EM | Admit: 2016-08-11 | Discharge: 2016-08-11 | Disposition: A | Discharge status: Home / Self Care | Payer: Medicaid Other | Attending: Dermatology | Admitting: Dermatology

## 2016-08-11 DIAGNOSIS — Z8673 Personal history of transient ischemic attack (TIA), and cerebral infarction without residual deficits: Secondary | ICD-10-CM | POA: Diagnosis not present

## 2016-08-11 DIAGNOSIS — Z8542 Personal history of malignant neoplasm of other parts of uterus: Secondary | ICD-10-CM | POA: Insufficient documentation

## 2016-08-11 DIAGNOSIS — R1110 Vomiting, unspecified: Secondary | ICD-10-CM | POA: Diagnosis not present

## 2016-08-11 DIAGNOSIS — R109 Unspecified abdominal pain: Secondary | ICD-10-CM | POA: Insufficient documentation

## 2016-08-11 DIAGNOSIS — Z853 Personal history of malignant neoplasm of breast: Secondary | ICD-10-CM | POA: Insufficient documentation

## 2016-08-11 DIAGNOSIS — Z79899 Other long term (current) drug therapy: Secondary | ICD-10-CM | POA: Diagnosis not present

## 2016-08-11 DIAGNOSIS — I10 Essential (primary) hypertension: Secondary | ICD-10-CM | POA: Diagnosis not present

## 2016-08-11 DIAGNOSIS — F17210 Nicotine dependence, cigarettes, uncomplicated: Secondary | ICD-10-CM | POA: Diagnosis not present

## 2016-08-11 DIAGNOSIS — Z5321 Procedure and treatment not carried out due to patient leaving prior to being seen by health care provider: Secondary | ICD-10-CM | POA: Insufficient documentation

## 2016-08-11 LAB — COMPREHENSIVE METABOLIC PANEL
ALT: 16 U/L (ref 14–54)
AST: 21 U/L (ref 15–41)
Albumin: 3.4 g/dL — ABNORMAL LOW (ref 3.5–5.0)
Alkaline Phosphatase: 46 U/L (ref 38–126)
Anion gap: 10 (ref 5–15)
BUN: 24 mg/dL — ABNORMAL HIGH (ref 6–20)
CHLORIDE: 103 mmol/L (ref 101–111)
CO2: 23 mmol/L (ref 22–32)
CREATININE: 0.76 mg/dL (ref 0.44–1.00)
Calcium: 8.9 mg/dL (ref 8.9–10.3)
GFR calc non Af Amer: >60 mL/min (ref >60)
Glucose, Bld: 90 mg/dL (ref 65–99)
POTASSIUM: 3.7 mmol/L (ref 3.5–5.1)
SODIUM: 136 mmol/L (ref 135–145)
Total Bilirubin: 0.4 mg/dL (ref 0.3–1.2)
Total Protein: 6.3 g/dL — ABNORMAL LOW (ref 6.5–8.1)

## 2016-08-11 LAB — URINALYSIS, ROUTINE W REFLEX MICROSCOPIC
Bilirubin Urine: NEGATIVE
GLUCOSE, UA: NEGATIVE mg/dL
HGB URINE DIPSTICK: NEGATIVE
Ketones, ur: NEGATIVE mg/dL
Leukocytes, UA: NEGATIVE
Nitrite: NEGATIVE
PH: 5 (ref 5.0–8.0)
Protein, ur: NEGATIVE mg/dL
SPECIFIC GRAVITY, URINE: 1.025 (ref 1.005–1.030)

## 2016-08-11 LAB — CBC
HEMATOCRIT: 38.8 % (ref 36.0–46.0)
Hemoglobin: 12.6 g/dL (ref 12.0–15.0)
MCH: 32 pg (ref 26.0–34.0)
MCHC: 32.5 g/dL (ref 30.0–36.0)
MCV: 98.5 fL (ref 78.0–100.0)
PLATELETS: 380 10*3/uL (ref 150–400)
RBC: 3.94 MIL/uL (ref 3.87–5.11)
RDW: 12.3 % (ref 11.5–15.5)
WBC: 8.6 10*3/uL (ref 4.0–10.5)

## 2016-08-11 LAB — LIPASE, BLOOD: Lipase: 16 U/L (ref 11–51)

## 2016-08-11 NOTE — ED Notes (Signed)
Pt walking out of ER lobby stating "this is crazy"; this RN encouraged patient to stay but pt proceeded to exit ER. Pt ambulatory with steady gait noted

## 2016-08-11 NOTE — ED Triage Notes (Signed)
Pt reports diagnosed with stomach cancer 3 months ago.  Reports LUQ pain x 3 months.  States she was taking medicine for pain prescribed by her PCP and it wasn't working and is no longer taking pain medicine because PCP wouldn't prescribe anything else.  Reports nausea and vomiting (8 times) since 3am.  States she believes n/v is because she is in so much pain. Pt has not had surgery or any treatments since being diagnosed with stomach cancer.

## 2016-08-11 NOTE — ED Notes (Signed)
Called to recheck pt's Vitals. No answer x's 3.

## 2016-08-12 ENCOUNTER — Encounter (HOSPITAL_COMMUNITY): Payer: Self-pay | Admitting: Emergency Medicine

## 2016-08-12 ENCOUNTER — Emergency Department (HOSPITAL_COMMUNITY)
Admission: EM | Admit: 2016-08-12 | Discharge: 2016-08-12 | Disposition: A | Discharge status: Home / Self Care | Payer: Medicaid Other | Attending: Emergency Medicine | Admitting: Emergency Medicine

## 2016-08-12 DIAGNOSIS — Z853 Personal history of malignant neoplasm of breast: Secondary | ICD-10-CM | POA: Insufficient documentation

## 2016-08-12 DIAGNOSIS — F17210 Nicotine dependence, cigarettes, uncomplicated: Secondary | ICD-10-CM | POA: Diagnosis not present

## 2016-08-12 DIAGNOSIS — Z79899 Other long term (current) drug therapy: Secondary | ICD-10-CM | POA: Insufficient documentation

## 2016-08-12 DIAGNOSIS — K2970 Gastritis, unspecified, without bleeding: Secondary | ICD-10-CM | POA: Diagnosis not present

## 2016-08-12 DIAGNOSIS — R1012 Left upper quadrant pain: Secondary | ICD-10-CM

## 2016-08-12 DIAGNOSIS — Z8673 Personal history of transient ischemic attack (TIA), and cerebral infarction without residual deficits: Secondary | ICD-10-CM | POA: Insufficient documentation

## 2016-08-12 DIAGNOSIS — I10 Essential (primary) hypertension: Secondary | ICD-10-CM | POA: Diagnosis not present

## 2016-08-12 DIAGNOSIS — Z8542 Personal history of malignant neoplasm of other parts of uterus: Secondary | ICD-10-CM | POA: Diagnosis not present

## 2016-08-12 MED ORDER — GI COCKTAIL ~~LOC~~
30.0000 mL | Freq: Once | ORAL | Status: AC
  Administered 2016-08-12: 30 mL via ORAL
  Filled 2016-08-12: qty 30

## 2016-08-12 MED ORDER — SUCRALFATE 1 G PO TABS: 1.0000 g | ORAL_TABLET | Freq: Three times a day (TID) | ORAL | 0 refills | Status: DC

## 2016-08-12 MED ORDER — SUCRALFATE 1 G PO TABS
1.0000 g | ORAL_TABLET | Freq: Once | ORAL | Status: AC
  Administered 2016-08-12: 1 g via ORAL
  Filled 2016-08-12: qty 1

## 2016-08-12 NOTE — ED Triage Notes (Signed)
Pt c/o chronic emesis and LUQ abdominal pain, worsened past 2 days. Diagnosed with gastric cancer 3 months ago. No chemo or radiation currently. No diarrhea, no blood in emesis or stool.

## 2016-08-12 NOTE — Discharge Instructions (Signed)
Please read and follow all provided instructions.  Your diagnoses today include:  1. LUQ pain   2. Gastritis without bleeding, unspecified chronicity, unspecified gastritis type     Tests performed today include: Vital signs. See below for your results today.   Medications prescribed:  Take as prescribed   Home care instructions:  Follow any educational materials contained in this packet.  Follow-up instructions: Please follow-up with your Gastroenterologist for further evaluation of symptoms and treatment   Return instructions:  Please return to the Emergency Department if you do not get better, if you get worse, or new symptoms OR  - Fever (temperature greater than 101.77F)  - Bleeding that does not stop with holding pressure to the area    -Severe pain (please note that you may be more sore the day after your accident)  - Chest Pain  - Difficulty breathing  - Severe nausea or vomiting  - Inability to tolerate food and liquids  - Passing out  - Skin becoming red around your wounds  - Change in mental status (confusion or lethargy)  - New numbness or weakness    Please return if you have any other emergent concerns.  Additional Information:  Your vital signs today were: BP 122/91 (BP Location: Left Arm)    Pulse 103    Temp 98.1 F (36.7 C) (Oral)    Resp 16    SpO2 99%  If your blood pressure (BP) was elevated above 135/85 this visit, please have this repeated by your doctor within one month. ---------------

## 2016-08-12 NOTE — ED Provider Notes (Signed)
[facility] DEPT Provider Note   CSN: [id number] Arrival date & time: 08/12/16  1520     History   Chief Complaint Chief Complaint  Patient presents with  . Emesis  . Abdominal Pain    HPI Felicia Moore is a 36 y.o. female.  HPI  35 y.o. female with a hx of HTN, presents to the Emergency Department today complaining of LUQ pain x 3 months that has worsened in the past 2 days. Pt was taking medications for pain Rxed by PCP, but PCP no longer prescribed narcotic pain medication. Notes N/V x 8 times. Emesis PTA. None in ED. No hematemesis. Suspect Stomach Cancer on 06-14-16 via CT Scan. EGD done on 06-27-16 with biopsies done. Negative for cancer. Negative H. Pylori. Showed esophageal and gastric ulcers. Pt on Prilosec 40mg  BID. Denies fevers. No CP/SOB. Follow up with GI at the end of the month. No other symptoms noted   Past Medical History:  Diagnosis Date  . Breast CA ([place])   . Breast cancer ([place])    left lumpectomy 2006  . Diverticulitis   . Hypertension   . Migraines   . Sciatica   . Stroke [facility]) 2013  . Uterine cancer [facility])    Patient Active Problem List   Diagnosis Date Noted  . Low back pain 09/28/2014  . Essential hypertension 09/28/2014  . Allergic rhinitis 09/28/2014   Past Surgical History:  Procedure Laterality Date  . ABDOMINAL HYSTERECTOMY    . BREAST LUMPECTOMY Left   . CARPAL TUNNEL RELEASE Right   . CHOLECYSTECTOMY     OB History    No data available     Home Medications    Prior to Admission medications   Medication Sig Start Date End Date Taking? Authorizing Provider  carvedilol (COREG) 6.25 MG tablet Take 1 tablet (6.25 mg total) by mouth 2 (two) times daily with a meal. 10/18/14   Billy Fischer, MD  fluticasone (FLONASE) 50 MCG/ACT nasal spray Place 1 spray into both nostrils 2 (two) times daily. 04/10/16   Historical Provider, MD  gabapentin (NEURONTIN) 400 MG capsule Take 400 mg by mouth 3 (three) times daily. 05/02/16   Historical  Provider, MD  ibuprofen (ADVIL,MOTRIN) 200 MG tablet Take 400 mg by mouth every 6 (six) hours as needed for fever or headache (or pain).    Historical Provider, MD  lisinopril-hydrochlorothiazide (PRINZIDE,ZESTORETIC) 20-12.5 MG per tablet Take 1 tablet by mouth daily. 10/18/14   Billy Fischer, MD  montelukast (SINGULAIR) 10 MG tablet Take 10 mg by mouth daily.     Historical Provider, MD  omeprazole (PRILOSEC) 40 MG capsule Take 1 capsule (40 mg total) by mouth 2 (two) times daily. 06/27/16   Mauri Pole, MD  oxyCODONE-acetaminophen (PERCOCET/ROXICET) 5-325 MG tablet Take 1 tablet by mouth every 6 (six) hours as needed for moderate pain or severe pain. 06/15/16   Domenic Moras, PA-C  PROAIR HFA 108 (90 Base) MCG/ACT inhaler Inhale 2 puffs into the lungs every 4 (four) hours as needed for wheezing or shortness of breath.  04/26/16   Historical Provider, MD  promethazine (PHENERGAN) 25 MG tablet Take 1 tablet (25 mg total) by mouth every 6 (six) hours as needed for nausea. 06/15/16   Domenic Moras, PA-C    Family History Family History  Problem Relation Age of Onset  . Diabetes Mother   . Heart disease Father   . Diabetes Father   . Breast cancer Maternal Grandmother   . Stomach cancer  Maternal Grandfather   . Colon cancer Paternal Grandfather     Social History Social History  Substance Use Topics  . Smoking status: Current Every Day Smoker    Packs/day: 1.00    Years: 20.00    Types: Cigarettes  . Smokeless tobacco: Never Used  . Alcohol use Yes     Comment: rare     Allergies   Reglan [metoclopramide]; Toradol [ketorolac tromethamine]; and Zofran [ondansetron hcl]   Review of Systems Review of Systems ROS reviewed and all are negative for acute change except as noted in the HPI.  Physical Exam Updated Vital Signs BP 122/91 (BP Location: Left Arm)   Pulse 103   Temp 98.1 F (36.7 C) (Oral)   Resp 16   SpO2 99%   Physical Exam  Constitutional: She is oriented to  person, place, and time. Vital signs are normal. She appears well-developed and well-nourished.  HENT:  Head: Normocephalic and atraumatic.  Right Ear: Hearing normal.  Left Ear: Hearing normal.  Eyes: Conjunctivae and EOM are normal. Pupils are equal, round, and reactive to light.  Neck: Normal range of motion. Neck supple.  Cardiovascular: Normal rate, regular rhythm, normal heart sounds and intact distal pulses.   Pulmonary/Chest: Effort normal and breath sounds normal. No respiratory distress. She has no wheezes. She has no rales. She exhibits no tenderness.  Abdominal: Soft. Normal appearance and bowel sounds are normal. There is tenderness in the left upper quadrant. There is no rigidity, no rebound, no guarding, no CVA tenderness, no tenderness at McBurney's point and negative Murphy's sign.  Abdomen soft. Non distended  Musculoskeletal: Normal range of motion.  Neurological: She is alert and oriented to person, place, and time.  Skin: Skin is warm and dry.  Psychiatric: She has a normal mood and affect. Her speech is normal and behavior is normal. Thought content normal.  Nursing note and vitals reviewed.  ED Treatments / Results  Labs (all labs ordered are listed, but only abnormal results are displayed) Labs Reviewed - No data to display  EKG  EKG Interpretation None      Radiology No results found.  Procedures Procedures (including critical care time)  Medications Ordered in ED Medications  sucralfate (CARAFATE) tablet 1 g (not administered)  gi cocktail (Maalox,Lidocaine,Donnatal) (30 mLs Oral Given 08/12/16 1603)   Initial Impression / Assessment and Plan / ED Course  I have reviewed the triage vital signs and the nursing notes.  Pertinent labs & imaging results that were available during my care of the patient were reviewed by me and considered in my medical decision making (see chart for details).  Final Clinical Impressions(s) / ED Diagnoses  {I have  reviewed and evaluated the relevant laboratory values.   {I have reviewed the relevant previous healthcare records.  {I obtained HPI from historian. {Patient discussed with supervising physician.  ED Course:  Assessment: Patient is a 36 y.o. female presents with LUQ abdominal pain x 2 days. Hx gastric ulcer Dx by EGD 06-27-16. Negative for cancer, negative for H. Pylori. Notes pain intermittent x 3 months. On exam, nontoxic, nonseptic appearing, in no apparent distress. Patient's pain and other symptoms adequately managed in emergency department. Labs reviewed from yesterday as patient did not want to wait in ED. Vitals reviewed.  Patient does not meet the SIRS or Sepsis criteria.  On repeat exam patient does not have a surgical abdomen and there are no peritoneal signs.  No sign of gastric perforation as abdomen is soft.  VSS. Pt resting comfortably. Also duration of symptoms leads to diagnosis less likely. No indication of appendicitis, bowel obstruction, bowel perforation, cholecystitis, diverticulitis, PID or ectopic pregnancy. Given GI Cocktail with moderate relief. Will Rx Carafate and follow up to GI. Patient discharged home with symptomatic treatment and given strict instructions for follow-up with their primary care physician.  I have also discussed reasons to return immediately to the ER.  Patient expresses understanding and agrees with plan.  Disposition/Plan:  DC Home Additional Verbal discharge instructions given and discussed with patient.  Pt Instructed to f/u with GI for evaluation and treatment of symptoms. Return precautions given Pt acknowledges and agrees with plan  Supervising Physician Merrily Pew, MD  Final diagnoses:  LUQ pain    New Prescriptions New Prescriptions   No medications on file     Shary Decamp, PA-C 08/12/16 [place], MD 08/14/16 0021

## 2016-10-31 ENCOUNTER — Encounter: Payer: Self-pay | Admitting: Gastroenterology

## 2016-11-10 ENCOUNTER — Emergency Department (HOSPITAL_COMMUNITY)
Admission: EM | Admit: 2016-11-10 | Discharge: 2016-11-10 | Disposition: A | Discharge status: Home / Self Care | Payer: Medicaid Other | Attending: Emergency Medicine | Admitting: Emergency Medicine

## 2016-11-10 ENCOUNTER — Encounter (HOSPITAL_COMMUNITY): Payer: Self-pay | Admitting: *Deleted

## 2016-11-10 DIAGNOSIS — Z8541 Personal history of malignant neoplasm of cervix uteri: Secondary | ICD-10-CM | POA: Diagnosis not present

## 2016-11-10 DIAGNOSIS — R509 Fever, unspecified: Secondary | ICD-10-CM | POA: Diagnosis present

## 2016-11-10 DIAGNOSIS — I10 Essential (primary) hypertension: Secondary | ICD-10-CM | POA: Insufficient documentation

## 2016-11-10 DIAGNOSIS — B349 Viral infection, unspecified: Secondary | ICD-10-CM | POA: Diagnosis not present

## 2016-11-10 DIAGNOSIS — F17210 Nicotine dependence, cigarettes, uncomplicated: Secondary | ICD-10-CM | POA: Diagnosis not present

## 2016-11-10 DIAGNOSIS — Z79899 Other long term (current) drug therapy: Secondary | ICD-10-CM | POA: Insufficient documentation

## 2016-11-10 DIAGNOSIS — Z853 Personal history of malignant neoplasm of breast: Secondary | ICD-10-CM | POA: Diagnosis not present

## 2016-11-10 LAB — COMPREHENSIVE METABOLIC PANEL
ALBUMIN: 2.9 g/dL — AB (ref 3.5–5.0)
ALT: 33 U/L (ref 14–54)
ANION GAP: 7 (ref 5–15)
AST: 34 U/L (ref 15–41)
Alkaline Phosphatase: 45 U/L (ref 38–126)
BILIRUBIN TOTAL: 0.3 mg/dL (ref 0.3–1.2)
BUN: 21 mg/dL — ABNORMAL HIGH (ref 6–20)
CALCIUM: 7.9 mg/dL — AB (ref 8.9–10.3)
CO2: 24 mmol/L (ref 22–32)
Chloride: 108 mmol/L (ref 101–111)
Creatinine, Ser: 0.81 mg/dL (ref 0.44–1.00)
GLUCOSE: 78 mg/dL (ref 65–99)
POTASSIUM: 3.7 mmol/L (ref 3.5–5.1)
Sodium: 139 mmol/L (ref 135–145)
TOTAL PROTEIN: 5 g/dL — AB (ref 6.5–8.1)

## 2016-11-10 LAB — URINALYSIS, ROUTINE W REFLEX MICROSCOPIC
BILIRUBIN URINE: NEGATIVE
GLUCOSE, UA: NEGATIVE mg/dL
HGB URINE DIPSTICK: NEGATIVE
Ketones, ur: 5 mg/dL — AB
LEUKOCYTES UA: NEGATIVE
NITRITE: NEGATIVE
PROTEIN: 30 mg/dL — AB
Specific Gravity, Urine: 1.032 — ABNORMAL HIGH (ref 1.005–1.030)
pH: 5 (ref 5.0–8.0)

## 2016-11-10 LAB — CBC
HEMATOCRIT: 38.7 % (ref 36.0–46.0)
HEMOGLOBIN: 12.9 g/dL (ref 12.0–15.0)
MCH: 32.7 pg (ref 26.0–34.0)
MCHC: 33.3 g/dL (ref 30.0–36.0)
MCV: 98.2 fL (ref 78.0–100.0)
Platelets: 290 10*3/uL (ref 150–400)
RBC: 3.94 MIL/uL (ref 3.87–5.11)
RDW: 13.1 % (ref 11.5–15.5)
WBC: 5.4 10*3/uL (ref 4.0–10.5)

## 2016-11-10 LAB — I-STAT CG4 LACTIC ACID, ED: Lactic Acid, Venous: 0.59 mmol/L (ref 0.5–1.9)

## 2016-11-10 LAB — LIPASE, BLOOD: Lipase: 23 U/L (ref 11–51)

## 2016-11-10 MED ORDER — DOXYCYCLINE HYCLATE 100 MG PO TABS
200.0000 mg | ORAL_TABLET | Freq: Once | ORAL | Status: AC
  Administered 2016-11-10: 200 mg via ORAL
  Filled 2016-11-10: qty 2

## 2016-11-10 MED ORDER — DOXYCYCLINE HYCLATE 100 MG PO CAPS: 100.0000 mg | ORAL_CAPSULE | Freq: Two times a day (BID) | ORAL | 0 refills | Status: DC

## 2016-11-10 NOTE — ED Notes (Signed)
Pt had advil one hour ago for her temp

## 2016-11-10 NOTE — ED Provider Notes (Signed)
[facility] DEPT Provider Note   CSN: [id number] Arrival date & time: 11/10/16  1956  By signing my name below, I, Norris Cross, attest that this documentation has been prepared under the direction and in the presence of Domenic Moras, PA-C. Electronically Signed: Norris Cross , ED Scribe. 11/10/16. 8:16 PM.   History   Chief Complaint Chief Complaint  Patient presents with  . Fever    HPI Felicia Moore is a 36 y.o. female with hx of partial hysterectomy, HTN who presents to the Emergency Department complaining of constant fever with onset x4 days. Pt states that she has had constant fever for the past x4 days. Her highest measured fever at home was 102. She reportedly pulled 2 ticks from her legs x4 days ago as well. She reported fever, nausea, headache, diarrhea, sore throat, myalgia, non-itchy rash. Pt has taken Ibuprofen, Benadryl with no relief. She denies vomiting. She denies environmental changes, hx of [place]. Pt denies chance of pregnancy.  The history is provided by the patient. No language interpreter was used.    Past Medical History:  Diagnosis Date  . Breast CA ([place])   . Breast cancer ([place])    left lumpectomy 2006  . Diverticulitis   . Hypertension   . Migraines   . Sciatica   . Stroke [facility]) 2013  . Uterine cancer [facility])     Patient Active Problem List   Diagnosis Date Noted  . Low back pain 09/28/2014  . Essential hypertension 09/28/2014  . Allergic rhinitis 09/28/2014    Past Surgical History:  Procedure Laterality Date  . ABDOMINAL HYSTERECTOMY    . BREAST LUMPECTOMY Left   . CARPAL TUNNEL RELEASE Right   . CHOLECYSTECTOMY      OB History    No data available       Home Medications    Prior to Admission medications   Medication Sig Start Date End Date Taking? Authorizing Provider  carvedilol (COREG) 6.25 MG tablet Take 1 tablet (6.25 mg total) by mouth 2 (two) times daily with a meal. 10/18/14   Kindl, Nelda Severe, MD    fluticasone (FLONASE) 50 MCG/ACT nasal spray Place 1 spray into both nostrils 2 (two) times daily. 04/10/16   [provider]  gabapentin (NEURONTIN) 300 MG capsule Take 600 mg by mouth 3 (three) times daily.  05/02/16   [provider]  hydrOXYzine (VISTARIL) 25 MG capsule Take 25 mg by mouth daily. 08/08/16   [provider]  lisinopril-hydrochlorothiazide (PRINZIDE,ZESTORETIC) 20-12.5 MG per tablet Take 1 tablet by mouth daily. 10/18/14   Billy Fischer, MD  montelukast (SINGULAIR) 10 MG tablet Take 10 mg by mouth daily.     [provider]  omeprazole (PRILOSEC) 40 MG capsule Take 1 capsule (40 mg total) by mouth 2 (two) times daily. 06/27/16   Mauri Pole, MD  oxyCODONE-acetaminophen (PERCOCET/ROXICET) 5-325 MG tablet Take 1 tablet by mouth every 6 (six) hours as needed for moderate pain or severe pain. Patient not taking: Reported on 08/12/2016 06/15/16   Domenic Moras, PA-C  PROAIR HFA [phone] Base) MCG/ACT inhaler Inhale 2 puffs into the lungs every 4 (four) hours as needed for wheezing or shortness of breath.  04/26/16   [provider]  promethazine (PHENERGAN) 25 MG tablet Take 1 tablet (25 mg total) by mouth every 6 (six) hours as needed for nausea. 06/15/16   Domenic Moras, PA-C  sertraline (ZOLOFT) 50 MG tablet Take 50 mg by mouth daily. 08/08/16  [provider]  sucralfate (CARAFATE) 1 g tablet Take 1 tablet (1 g total) by mouth 4 (four) times daily -  with meals and at bedtime. 08/12/16   Shary Decamp, PA-C    Family History Family History  Problem Relation Age of Onset  . Diabetes Mother   . Heart disease Father   . Diabetes Father   . Breast cancer Maternal Grandmother   . Stomach cancer Maternal Grandfather   . Colon cancer Paternal Grandfather     Social History Social History  Substance Use Topics  . Smoking status: Current Every Day Smoker    Packs/day: 1.00    Years: 20.00    Types: Cigarettes  . Smokeless  tobacco: Never Used  . Alcohol use Yes     Comment: rare     Allergies   Reglan [metoclopramide]; Toradol [ketorolac tromethamine]; and Zofran [ondansetron hcl]   Review of Systems Review of Systems  Constitutional: Positive for fever.  HENT: Positive for sore throat.   Gastrointestinal: Positive for diarrhea and nausea. Negative for vomiting.  Musculoskeletal: Positive for myalgias.  Neurological: Positive for headaches.     Physical Exam Updated Vital Signs BP 111/78 (BP Location: Left Arm)   Pulse 80   Temp 98.8 F (37.1 C) (Oral)   Resp 18   Ht 5\' 4"  (1.626 m)   Wt 155 lb (70.3 kg)   SpO2 97%   BMI 26.61 kg/m   Physical Exam  Constitutional: She appears well-developed and well-nourished. No distress.  HENT:  Head: Normocephalic and atraumatic.  Mouth/Throat: Uvula is midline.  Eyes: Conjunctivae are normal.  Neck: Trachea normal. Neck supple. No neck rigidity.  No nuchal rigidity.Uvula midline. No tonsillar exudates. Trachea midline. No cervical lymphadenopathy.   Cardiovascular: Normal rate and regular rhythm.   No murmur heard. Pulmonary/Chest: Effort normal and breath sounds normal. No respiratory distress.  Abdominal: Soft. There is no tenderness.  Musculoskeletal: She exhibits no edema.  Lymphadenopathy:    She has no cervical adenopathy.  Neurological: She is alert.  Skin: Skin is warm and dry. Rash noted. Rash is macular and papular.  Multiple papular, macular skin lesions to the bilateral upper and lower extremities as well as the trunk. There is no rash to palms of hands, soles of feet, within the oral mucosa.  Psychiatric: She has a normal mood and affect.  Nursing note and vitals reviewed.    ED Treatments / Results   DIAGNOSTIC STUDIES: Oxygen Saturation is 97% on RA, adequate by my interpretation.   COORDINATION OF CARE: 8:17 PM-Discussed next steps with pt. Pt verbalized understanding and is agreeable with the plan.    Labs (all  labs ordered are listed, but only abnormal results are displayed) Labs Reviewed  URINALYSIS, ROUTINE W REFLEX MICROSCOPIC - Abnormal; Notable for the following:       Result Value   APPearance CLOUDY (*)    Specific Gravity, Urine 1.032 (*)    Ketones, ur 5 (*)    Protein, ur 30 (*)    Bacteria, UA FEW (*)    Squamous Epithelial / LPF 6-30 (*)    All other components within normal limits  CBC  LIPASE, BLOOD  COMPREHENSIVE METABOLIC PANEL  I-STAT CG4 LACTIC ACID, ED    EKG  EKG Interpretation None       Radiology No results found.  Procedures Procedures (including critical care time)  Medications Ordered in ED Medications  doxycycline (VIBRA-TABS) tablet 200 mg (200 mg Oral Given 11/10/16 2048)  Initial Impression / Assessment and Plan / ED Course  I have reviewed the triage vital signs and the nursing notes.  Pertinent labs & imaging results that were available during my care of the patient were reviewed by me and considered in my medical decision making (see chart for details).     BP 111/78 (BP Location: Left Arm)   Pulse 80   Temp 98.8 F (37.1 C) (Oral)   Resp 18   Ht 5\' 4"  (1.626 m)   Wt 70.3 kg (155 lb)   SpO2 97%   BMI 26.61 kg/m    Final Clinical Impressions(s) / ED Diagnoses   Final diagnoses:  Viral syndrome    New Prescriptions New Prescriptions   DOXYCYCLINE (VIBRAMYCIN) 100 MG CAPSULE    Take 1 capsule (100 mg total) by mouth 2 (two) times daily. One po bid x 7 days   I personally performed the services described in this documentation, which was scribed in my presence. The recorded information has been reviewed and is accurate.   8:47 PM Pt here with report of fever, headache, joint pain, flu like sxs and non itchy non tender rash.  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  Given her presenting complaint, she will be treated for potential RMSF/Lyme/Erhlichiosis  with Doxycycline.  She does not appear toxic, no meningismus sxs and her vital sign is stable.  Pt agrees to f/u with PCP for further care.  Return precaution discussed.      Domenic Moras, PA-C 11/10/16 2053    Margette Fast, MD 11/11/16 [phone]

## 2016-11-10 NOTE — Discharge Instructions (Signed)
Due to recent tick bite, you are being treated for suspect [facility] Spotted Fever or Lyme disease with doxycycline.  Take tylenol/ibuprofen at home as needed.  Follow up with your doctor for further care.  Return if you have any concerns.

## 2016-11-10 NOTE — ED Triage Notes (Signed)
The pt is c/o joint pain rash over her body  Nausea vomiting fever since Tuesday  She had 2 ticks moved around Monday.  lmp none

## 2016-11-13 ENCOUNTER — Emergency Department (HOSPITAL_COMMUNITY)
Admission: EM | Admit: 2016-11-13 | Discharge: 2016-11-13 | Disposition: A | Discharge status: Home / Self Care | Payer: Medicaid Other | Attending: Specialist | Admitting: Specialist

## 2016-11-13 ENCOUNTER — Encounter (HOSPITAL_COMMUNITY): Payer: Self-pay

## 2016-11-13 DIAGNOSIS — Z5321 Procedure and treatment not carried out due to patient leaving prior to being seen by health care provider: Secondary | ICD-10-CM | POA: Diagnosis not present

## 2016-11-13 DIAGNOSIS — R112 Nausea with vomiting, unspecified: Secondary | ICD-10-CM | POA: Insufficient documentation

## 2016-11-13 HISTORY — DX: Spotted fever due to Rickettsia rickettsii: A77.0

## 2016-11-13 LAB — COMPREHENSIVE METABOLIC PANEL
ALBUMIN: 3.6 g/dL (ref 3.5–5.0)
ALT: 62 U/L — AB (ref 14–54)
AST: 66 U/L — AB (ref 15–41)
Alkaline Phosphatase: 54 U/L (ref 38–126)
Anion gap: 9 (ref 5–15)
BUN: 18 mg/dL (ref 6–20)
CHLORIDE: 109 mmol/L (ref 101–111)
CO2: 23 mmol/L (ref 22–32)
CREATININE: 0.86 mg/dL (ref 0.44–1.00)
Calcium: 8.6 mg/dL — ABNORMAL LOW (ref 8.9–10.3)
GFR calc non Af Amer: >60 mL/min (ref >60)
Glucose, Bld: 97 mg/dL (ref 65–99)
Potassium: 4.1 mmol/L (ref 3.5–5.1)
SODIUM: 141 mmol/L (ref 135–145)
Total Bilirubin: 0.4 mg/dL (ref 0.3–1.2)
Total Protein: 6 g/dL — ABNORMAL LOW (ref 6.5–8.1)

## 2016-11-13 LAB — CBC WITH DIFFERENTIAL/PLATELET
Basophils Absolute: 0 10*3/uL (ref 0.0–0.1)
Basophils Relative: 0 %
EOS PCT: 2 %
Eosinophils Absolute: 0.2 10*3/uL (ref 0.0–0.7)
HEMATOCRIT: 39.4 % (ref 36.0–46.0)
Hemoglobin: 12.8 g/dL (ref 12.0–15.0)
LYMPHS ABS: 2.6 10*3/uL (ref 0.7–4.0)
Lymphocytes Relative: 25 %
MCH: 32.3 pg (ref 26.0–34.0)
MCHC: 32.5 g/dL (ref 30.0–36.0)
MCV: 99.5 fL (ref 78.0–100.0)
MONOS PCT: 4 %
Monocytes Absolute: 0.4 10*3/uL (ref 0.1–1.0)
Neutro Abs: 7.1 10*3/uL (ref 1.7–7.7)
Neutrophils Relative %: 69 %
Platelets: 395 10*3/uL (ref 150–400)
RBC: 3.96 MIL/uL (ref 3.87–5.11)
RDW: 13.1 % (ref 11.5–15.5)
WBC: 10.3 10*3/uL (ref 4.0–10.5)

## 2016-11-13 LAB — LIPASE, BLOOD: LIPASE: 22 U/L (ref 11–51)

## 2016-11-13 LAB — I-STAT CG4 LACTIC ACID, ED: Lactic Acid, Venous: 0.93 mmol/L (ref 0.5–1.9)

## 2016-11-13 NOTE — ED Notes (Signed)
Pt. Did not answer x 3

## 2016-11-13 NOTE — ED Triage Notes (Signed)
Per Pt, Pt was diagnosed with [place] Spotted Fever of Friday. Pt was sent home with antibiotics, but pt reports nausea and vomiting that started yesterday. Unable to keep down medication. Worsening body aches and emesis.

## 2016-11-13 NOTE — ED Notes (Signed)
Patient name called 2 times with no response.

## 2017-03-18 ENCOUNTER — Encounter (HOSPITAL_COMMUNITY): Payer: Self-pay

## 2017-03-18 ENCOUNTER — Emergency Department (HOSPITAL_COMMUNITY): Payer: Medicaid Other

## 2017-03-18 ENCOUNTER — Emergency Department (HOSPITAL_COMMUNITY)
Admission: EM | Admit: 2017-03-18 | Discharge: 2017-03-18 | Discharge status: Against Medical Advice | Payer: Medicaid Other | Attending: Emergency Medicine | Admitting: Emergency Medicine

## 2017-03-18 DIAGNOSIS — Z79899 Other long term (current) drug therapy: Secondary | ICD-10-CM | POA: Diagnosis not present

## 2017-03-18 DIAGNOSIS — I9589 Other hypotension: Secondary | ICD-10-CM

## 2017-03-18 DIAGNOSIS — I10 Essential (primary) hypertension: Secondary | ICD-10-CM | POA: Diagnosis not present

## 2017-03-18 DIAGNOSIS — F17210 Nicotine dependence, cigarettes, uncomplicated: Secondary | ICD-10-CM | POA: Insufficient documentation

## 2017-03-18 DIAGNOSIS — E861 Hypovolemia: Secondary | ICD-10-CM | POA: Insufficient documentation

## 2017-03-18 DIAGNOSIS — R531 Weakness: Secondary | ICD-10-CM | POA: Diagnosis present

## 2017-03-18 LAB — COMPREHENSIVE METABOLIC PANEL
ALT: 16 U/L (ref 14–54)
AST: 25 U/L (ref 15–41)
Albumin: 3.4 g/dL — ABNORMAL LOW (ref 3.5–5.0)
Alkaline Phosphatase: 55 U/L (ref 38–126)
Anion gap: 10 (ref 5–15)
BUN: 15 mg/dL (ref 6–20)
CHLORIDE: 106 mmol/L (ref 101–111)
CO2: 20 mmol/L — AB (ref 22–32)
CREATININE: 0.87 mg/dL (ref 0.44–1.00)
Calcium: 8.2 mg/dL — ABNORMAL LOW (ref 8.9–10.3)
Glucose, Bld: 94 mg/dL (ref 65–99)
POTASSIUM: 4 mmol/L (ref 3.5–5.1)
SODIUM: 136 mmol/L (ref 135–145)
Total Bilirubin: 0.5 mg/dL (ref 0.3–1.2)
Total Protein: 6 g/dL — ABNORMAL LOW (ref 6.5–8.1)

## 2017-03-18 LAB — CBG MONITORING, ED: Glucose-Capillary: 105 mg/dL — ABNORMAL HIGH (ref 65–99)

## 2017-03-18 LAB — CBC WITH DIFFERENTIAL/PLATELET
BASOS ABS: 0 10*3/uL (ref 0.0–0.1)
BASOS PCT: 0 %
Eosinophils Absolute: 0.1 10*3/uL (ref 0.0–0.7)
Eosinophils Relative: 1 %
HCT: 40.9 % (ref 36.0–46.0)
Hemoglobin: 13.7 g/dL (ref 12.0–15.0)
LYMPHS ABS: 1.8 10*3/uL (ref 0.7–4.0)
LYMPHS PCT: 12 %
MCH: 34.3 pg — ABNORMAL HIGH (ref 26.0–34.0)
MCHC: 33.5 g/dL (ref 30.0–36.0)
MCV: 102.3 fL — ABNORMAL HIGH (ref 78.0–100.0)
MONOS PCT: 3 %
Monocytes Absolute: 0.4 10*3/uL (ref 0.1–1.0)
NEUTROS ABS: 12.6 10*3/uL — AB (ref 1.7–7.7)
NEUTROS PCT: 84 %
Platelets: 332 10*3/uL (ref 150–400)
RBC: 4 MIL/uL (ref 3.87–5.11)
RDW: 12.5 % (ref 11.5–15.5)
WBC: 15 10*3/uL — AB (ref 4.0–10.5)

## 2017-03-18 LAB — LIPASE, BLOOD: LIPASE: 22 U/L (ref 11–51)

## 2017-03-18 LAB — POC OCCULT BLOOD, ED: FECAL OCCULT BLD: NEGATIVE

## 2017-03-18 LAB — I-STAT TROPONIN, ED: TROPONIN I, POC: 0 ng/mL (ref 0.00–0.08)

## 2017-03-18 MED ORDER — SODIUM CHLORIDE 0.9 % IV BOLUS (SEPSIS)
1000.0000 mL | Freq: Once | INTRAVENOUS | Status: AC
  Administered 2017-03-18: 1000 mL via INTRAVENOUS

## 2017-03-18 MED ORDER — FENTANYL CITRATE (PF) 100 MCG/2ML IJ SOLN
50.0000 ug | Freq: Once | INTRAMUSCULAR | Status: AC
  Administered 2017-03-18: 50 ug via INTRAVENOUS
  Filled 2017-03-18: qty 2

## 2017-03-18 MED ORDER — FAMOTIDINE IN NACL 20-0.9 MG/50ML-% IV SOLN
20.0000 mg | Freq: Once | INTRAVENOUS | Status: AC
  Administered 2017-03-18: 20 mg via INTRAVENOUS
  Filled 2017-03-18: qty 50

## 2017-03-18 MED ORDER — PROCHLORPERAZINE EDISYLATE 5 MG/ML IJ SOLN
10.0000 mg | Freq: Once | INTRAMUSCULAR | Status: AC
  Administered 2017-03-18: 10 mg via INTRAVENOUS
  Filled 2017-03-18: qty 2

## 2017-03-18 MED ORDER — PANTOPRAZOLE SODIUM 40 MG IV SOLR
40.0000 mg | Freq: Once | INTRAVENOUS | Status: AC
  Administered 2017-03-18: 40 mg via INTRAVENOUS
  Filled 2017-03-18: qty 40

## 2017-03-18 NOTE — ED Notes (Signed)
This writer was going to check on pt when it was called over the radio that pt had walked out of room and left. IV was lying in room where pt had pulled out before leaving.

## 2017-03-18 NOTE — ED Provider Notes (Signed)
[place] DEPT Provider Note   CSN: [id number] Arrival date & time: 03/18/17  1315     History   Chief Complaint Chief Complaint  Patient presents with  . Hypotension  . Weakness    HPI Felicia Moore is a 36 y.o. female with h/o hypertension, stomach ulcer, diverticulitis, breast cancer in remission presents to ED for generalized weakness, dizziness, central chest/epigastric heaviness and syncope PTA. Pt was at plasma donation center in the process of finishing donation when she had sudden onset of dizziness, lower abdominal cramping and urge to diarrhea. She went to the bathroom and after BM she had sudden onset generalized weakness, dizziness and chest pressure ultimately she "passed out" for a few second while sitting on chair. No actual fall or head trauma. EMS was called and she was brought in for evaluation.   Has been donating plasma biweekly for several months without previous issues. States she donated around 825 cc of plasma and got almost all RBCs back but couldn't get the whole amount back bc of sudden onset of urge to diarrhea. Denies recent diarrhea, melena, hematochezia,, dysuria, hematuria, abnormal vaginal bleeding. Has chronic epigastric abdominal discomfort ("burning") from ulcer, unchanged recently. Chest/epigastric discomfort today is not similar to chronic ulcer, burning pain.  HPI  Past Medical History:  Diagnosis Date  . Breast CA ([place])   . Breast cancer ([place])    left lumpectomy 2006  . Diverticulitis   . Hypertension   . Migraines   . [facility] spotted fever   . Sciatica   . Stroke [facility]) 2013  . Uterine cancer [facility])     Patient Active Problem List   Diagnosis Date Noted  . Low back pain 09/28/2014  . Essential hypertension 09/28/2014  . Allergic rhinitis 09/28/2014    Past Surgical History:  Procedure Laterality Date  . ABDOMINAL HYSTERECTOMY    . BREAST LUMPECTOMY Left   . CARPAL TUNNEL RELEASE Right   . CHOLECYSTECTOMY       OB History    No data available       Home Medications    Prior to Admission medications   Medication Sig Start Date End Date Taking? Authorizing Provider  Aspirin-Salicylamide-Caffeine (BC FAST PAIN RELIEF) [phone] MG PACK Take 1 Package by mouth daily as needed (headache).   Yes [provider]  carvedilol (COREG) 6.25 MG tablet Take 1 tablet (6.25 mg total) by mouth 2 (two) times daily with a meal. 10/18/14  Yes Kindl, Nelda Severe, MD  fluticasone (FLONASE) 50 MCG/ACT nasal spray Place 1 spray into both nostrils 2 (two) times daily. 04/10/16  Yes [provider]  gabapentin (NEURONTIN) 300 MG capsule Take 600 mg by mouth 3 (three) times daily.  05/02/16  Yes [provider]  lisinopril-hydrochlorothiazide (PRINZIDE,ZESTORETIC) 20-12.5 MG per tablet Take 1 tablet by mouth daily. 10/18/14  Yes Kindl, Nelda Severe, MD  montelukast (SINGULAIR) 10 MG tablet Take 10 mg by mouth daily.    Yes [provider]  omeprazole (PRILOSEC) 40 MG capsule Take 1 capsule (40 mg total) by mouth 2 (two) times daily. 06/27/16  Yes Nandigam, Venia Minks, MD  PROAIR HFA 108 (90 Base) MCG/ACT inhaler Inhale 2 puffs into the lungs every 4 (four) hours as needed for wheezing or shortness of breath.  04/26/16  Yes [provider]  promethazine (PHENERGAN) 25 MG tablet Take 1 tablet (25 mg total) by mouth every 6 (six) hours as needed for nausea. 06/15/16  Yes Domenic Moras, PA-C  sucralfate ([place])  1 g tablet Take 1 tablet (1 g total) by mouth 4 (four) times daily -  with meals and at bedtime. 08/12/16  Yes Shary Decamp, PA-C  traZODone (DESYREL) 50 MG tablet Take 50 mg by mouth daily. 03/14/17  Yes [provider]  doxycycline (VIBRAMYCIN) 100 MG capsule Take 1 capsule (100 mg total) by mouth 2 (two) times daily. One po bid x 7 days Patient not taking: Reported on 03/18/2017 11/10/16   Domenic Moras, PA-C  oxyCODONE-acetaminophen (PERCOCET/ROXICET) 5-325 MG tablet Take 1  tablet by mouth every 6 (six) hours as needed for moderate pain or severe pain. Patient not taking: Reported on 08/12/2016 06/15/16   Domenic Moras, PA-C    Family History Family History  Problem Relation Age of Onset  . Diabetes Mother   . Heart disease Father   . Diabetes Father   . Breast cancer Maternal Grandmother   . Stomach cancer Maternal Grandfather   . Colon cancer Paternal Grandfather     Social History Social History  Substance Use Topics  . Smoking status: Current Every Day Smoker    Packs/day: 1.00    Years: 20.00    Types: Cigarettes  . Smokeless tobacco: Never Used  . Alcohol use Yes     Comment: rare     Allergies   Reglan [metoclopramide]; Toradol [ketorolac tromethamine]; and Zofran [ondansetron hcl]   Review of Systems Review of Systems  Constitutional: Positive for diaphoresis. Negative for chills and fever.  Eyes: Negative for visual disturbance.  Respiratory: Positive for chest tightness. Negative for cough.   Cardiovascular: Positive for chest pain. Negative for palpitations.  Gastrointestinal: Positive for abdominal pain (epigastric heaviness), diarrhea, nausea and vomiting. Negative for anal bleeding, blood in stool and constipation.  Genitourinary: Negative for difficulty urinating, dysuria, hematuria, menstrual problem and vaginal bleeding.  Musculoskeletal: Positive for myalgias.  Skin: Negative for wound.  Neurological: Positive for dizziness, syncope, weakness (generalized) and light-headedness. Negative for seizures, numbness and headaches.     Physical Exam Updated Vital Signs BP (!) 80/50 (BP Location: Left Arm)   Pulse 74   Temp (!) 97.4 F (36.3 C) (Oral)   Resp 18   Ht 5\' 4"  (1.626 m)   Wt 70.3 kg (155 lb)   SpO2 100%   BMI 26.61 kg/m   Physical Exam  Constitutional: She appears well-developed and well-nourished.  No diaphoresis. Non toxic.  HENT:  Head: Normocephalic and atraumatic.  Nose: Nose normal.  Moist mucous  membranes Tonsils and oropharynx normal  Eyes: Conjunctivae, EOM and lids are normal.  Neck: Trachea normal and normal range of motion.  Neck is supple Trachea midline No cervical adenopathy  Cardiovascular: Normal rate, regular rhythm, S1 normal, S2 normal and normal heart sounds.   Pulses:      Carotid pulses are 2+ on the right side, and 2+ on the left side.      Radial pulses are 2+ on the right side, and 2+ on the left side.       Dorsalis pedis pulses are 2+ on the right side, and 2+ on the left side.  RRR No S3 No orthopnea No LE edema No carotid bruits  Pulmonary/Chest: Effort normal and breath sounds normal. No respiratory distress. She has no decreased breath sounds. She has no wheezes. She has no rhonchi. She has no rales.  No chest wall tenderness CP not reproducible with AROM of upper extremities  Abdominal: Soft. Bowel sounds are normal. There is tenderness.  Epigastric tenderness. No  G/R/R. No suprapubic or CVA tenderness   Genitourinary: Rectal exam shows guaiac negative stool.  Genitourinary Comments: No stool or frank blood on DRE Chaperone was present.  I was able to palpate the first 2-3cm of the rectum digitally without gross abnormalities or masses. Patient able to tolerate examination.  Patient without pain in perianal/rectal area with palpation.  There are no external fissures or external hemorrhoids noted.  No induration or swelling of the perianal skin.  No signs of perirectal abscess.   DRE reveals good sphincter tone.    Lymphadenopathy:    She has no cervical adenopathy.  Neurological: She is alert. GCS eye subscore is 4. GCS verbal subscore is 5. GCS motor subscore is 6.  A&Ox4. Moving all four extremities in coordinated fashion.  Sensation intact in hands and feet.  5/5 strength with hand grip and ankle f/e  Skin: Skin is warm and dry. Capillary refill takes less than 2 seconds.  Psychiatric: She has a normal mood and affect. Her speech is normal  and behavior is normal. Judgment and thought content normal. Cognition and memory are normal.     ED Treatments / Results  Labs (all labs ordered are listed, but only abnormal results are displayed) Labs Reviewed  COMPREHENSIVE METABOLIC PANEL - Abnormal; Notable for the following:       Result Value   CO2 20 (*)    Calcium 8.2 (*)    Total Protein 6.0 (*)    Albumin 3.4 (*)    All other components within normal limits  CBC WITH DIFFERENTIAL/PLATELET - Abnormal; Notable for the following:    WBC 15.0 (*)    MCV 102.3 (*)    MCH 34.3 (*)    Neutro Abs 12.6 (*)    All other components within normal limits  CBG MONITORING, ED - Abnormal; Notable for the following:    Glucose-Capillary 105 (*)    All other components within normal limits  LIPASE, BLOOD  URINALYSIS, ROUTINE W REFLEX MICROSCOPIC  RAPID URINE DRUG SCREEN, HOSP PERFORMED  CBC WITH DIFFERENTIAL/PLATELET  I-STAT TROPONIN, ED  POC URINE PREG, ED  POC OCCULT BLOOD, ED  I-STAT TROPONIN, ED    EKG  EKG Interpretation None       Radiology Dg Chest 2 View  Result Date: 03/18/2017 CLINICAL DATA:  N/v, hypotensive, s/p syncopal episode s/p donation of plasma today. EXAM: CHEST  2 VIEW COMPARISON:  09/19/2014 FINDINGS: Normal mediastinum and cardiac silhouette. Normal pulmonary vasculature. No evidence of effusion, infiltrate, or pneumothorax. No acute bony abnormality. IMPRESSION: Normal chest radiograph Electronically Signed   By: Suzy Bouchard M.D.   On: 03/18/2017 15:21    Procedures Procedures (including critical care time)  Medications Ordered in ED Medications  sodium chloride 0.9 % bolus 1,000 mL (1,000 mLs Intravenous New Bag/Given 03/18/17 1501)  sodium chloride 0.9 % bolus 1,000 mL (1,000 mLs Intravenous New Bag/Given 03/18/17 1502)  pantoprazole (PROTONIX) injection 40 mg (40 mg Intravenous Given 03/18/17 1502)  famotidine (PEPCID) IVPB 20 mg premix (20 mg Intravenous New Bag/Given 03/18/17 1502)   fentaNYL (SUBLIMAZE) injection 50 mcg (50 mcg Intravenous Given 03/18/17 1547)  prochlorperazine (COMPAZINE) injection 10 mg (10 mg Intravenous Given 03/18/17 1546)     Initial Impression / Assessment and Plan / ED Course  I have reviewed the triage vital signs and the nursing notes.  Pertinent labs & imaging results that were available during my care of the patient were reviewed by me and considered in my medical decision  making (see chart for details).  Clinical Course as of Mar 18 1714  Sun Mar 18, 2017  1705 Went to re-evaluate pt. EMT notified me pt left AMA 5 minutes ago.   [CG]    Clinical Course User Index [CG] Kinnie Feil, PA-C   36 year old female presents to the ED with generalized weakness, central chest heaviness, dizziness followed by brief syncopal episode. One episode of sudden onset watery diarrhea, no frank blood. No head trauma. Symptoms started while she was donating plasma. Initial exam, patient is hypotensive with systolic and 80. No tachycardia. She is nontoxic.Neuro intact.   Lab work so far reassuring. Slight leukocytosis with WBC 15. No anemia. H/o gastric ulcer, however hemoccult negative today. EKG, CXR, trop x 1 negative.   Went to re-evaluate pt EMT notified me she had left AMA. Pending U/A, RUDS and urine pregnancy. She was given IVF in ED. No repeat VS obtained.   Final Clinical Impressions(s) / ED Diagnoses   Final diagnoses:  Hypotension due to hypovolemia    New Prescriptions New Prescriptions   No medications on file     Arlean Hopping 03/18/17 1715    Virgel Manifold, MD 03/21/17 1113

## 2017-03-18 NOTE — ED Notes (Signed)
This writer was walking out of another patients room and saw the patient dressed and heading out. Walked to the lobby and patient was heading out the door. IV was pulled out

## 2017-03-18 NOTE — ED Notes (Addendum)
Main lab was called, requested to come recollect lab that was clotted. Will be here in 20-25 min

## 2017-03-18 NOTE — ED Triage Notes (Signed)
Pt BIB [facility] from plasma donation center. Per EMS pt was donating plasma, got up to use the restroom, and became pale/weak/diaphoretic/nauseaus. She lost 250 mL of blood at biolife, per EMS. EMS unable to obtain BP or HR. CBG 97 with EMS. Reports pt gives plasma 2x weekly- last donation was Tuesday, with no difficulties.

## 2017-06-07 ENCOUNTER — Other Ambulatory Visit: Payer: Self-pay | Admitting: Gastroenterology

## 2017-06-07 DIAGNOSIS — K253 Acute gastric ulcer without hemorrhage or perforation: Secondary | ICD-10-CM

## 2017-07-08 ENCOUNTER — Inpatient Hospital Stay (HOSPITAL_COMMUNITY)
Admission: AD | Admit: 2017-07-08 | Discharge: 2017-07-08 | Disposition: A | Discharge status: Home / Self Care | Payer: Medicaid Other | Source: Ambulatory Visit | Attending: Obstetrics & Gynecology | Admitting: Obstetrics & Gynecology

## 2017-07-08 ENCOUNTER — Other Ambulatory Visit: Payer: Self-pay

## 2017-07-08 ENCOUNTER — Inpatient Hospital Stay (HOSPITAL_COMMUNITY): Payer: Medicaid Other

## 2017-07-08 DIAGNOSIS — Z8711 Personal history of peptic ulcer disease: Secondary | ICD-10-CM | POA: Insufficient documentation

## 2017-07-08 DIAGNOSIS — R509 Fever, unspecified: Secondary | ICD-10-CM | POA: Diagnosis not present

## 2017-07-08 DIAGNOSIS — Z79899 Other long term (current) drug therapy: Secondary | ICD-10-CM | POA: Insufficient documentation

## 2017-07-08 DIAGNOSIS — I10 Essential (primary) hypertension: Secondary | ICD-10-CM | POA: Insufficient documentation

## 2017-07-08 DIAGNOSIS — Z9049 Acquired absence of other specified parts of digestive tract: Secondary | ICD-10-CM | POA: Insufficient documentation

## 2017-07-08 DIAGNOSIS — Z90710 Acquired absence of both cervix and uterus: Secondary | ICD-10-CM | POA: Insufficient documentation

## 2017-07-08 DIAGNOSIS — Z7982 Long term (current) use of aspirin: Secondary | ICD-10-CM | POA: Diagnosis not present

## 2017-07-08 DIAGNOSIS — Z853 Personal history of malignant neoplasm of breast: Secondary | ICD-10-CM | POA: Diagnosis not present

## 2017-07-08 DIAGNOSIS — R350 Frequency of micturition: Secondary | ICD-10-CM | POA: Insufficient documentation

## 2017-07-08 DIAGNOSIS — Z888 Allergy status to other drugs, medicaments and biological substances status: Secondary | ICD-10-CM | POA: Diagnosis not present

## 2017-07-08 DIAGNOSIS — D72829 Elevated white blood cell count, unspecified: Secondary | ICD-10-CM | POA: Diagnosis not present

## 2017-07-08 DIAGNOSIS — F17210 Nicotine dependence, cigarettes, uncomplicated: Secondary | ICD-10-CM | POA: Diagnosis not present

## 2017-07-08 DIAGNOSIS — Z8542 Personal history of malignant neoplasm of other parts of uterus: Secondary | ICD-10-CM | POA: Insufficient documentation

## 2017-07-08 DIAGNOSIS — R112 Nausea with vomiting, unspecified: Secondary | ICD-10-CM | POA: Diagnosis not present

## 2017-07-08 DIAGNOSIS — R109 Unspecified abdominal pain: Secondary | ICD-10-CM | POA: Diagnosis present

## 2017-07-08 DIAGNOSIS — K5730 Diverticulosis of large intestine without perforation or abscess without bleeding: Secondary | ICD-10-CM | POA: Diagnosis not present

## 2017-07-08 DIAGNOSIS — R1084 Generalized abdominal pain: Secondary | ICD-10-CM

## 2017-07-08 DIAGNOSIS — Z8673 Personal history of transient ischemic attack (TIA), and cerebral infarction without residual deficits: Secondary | ICD-10-CM | POA: Diagnosis not present

## 2017-07-08 LAB — CBC WITH DIFFERENTIAL/PLATELET
Basophils Absolute: 0 10*3/uL (ref 0.0–0.1)
Basophils Relative: 0 %
EOS ABS: 0.4 10*3/uL (ref 0.0–0.7)
Eosinophils Relative: 3 %
HEMATOCRIT: 34.9 % — AB (ref 36.0–46.0)
HEMOGLOBIN: 11.6 g/dL — AB (ref 12.0–15.0)
LYMPHS ABS: 2.8 10*3/uL (ref 0.7–4.0)
LYMPHS PCT: 23 %
MCH: 31.8 pg (ref 26.0–34.0)
MCHC: 33.2 g/dL (ref 30.0–36.0)
MCV: 95.6 fL (ref 78.0–100.0)
Monocytes Absolute: 0.5 10*3/uL (ref 0.1–1.0)
Monocytes Relative: 4 %
NEUTROS ABS: 8.8 10*3/uL — AB (ref 1.7–7.7)
NEUTROS PCT: 70 %
Platelets: 323 10*3/uL (ref 150–400)
RBC: 3.65 MIL/uL — AB (ref 3.87–5.11)
RDW: 13.4 % (ref 11.5–15.5)
WBC: 12.6 10*3/uL — AB (ref 4.0–10.5)

## 2017-07-08 LAB — COMPREHENSIVE METABOLIC PANEL
ALK PHOS: 72 U/L (ref 38–126)
ALT: 16 U/L (ref 14–54)
ANION GAP: 10 (ref 5–15)
AST: 18 U/L (ref 15–41)
Albumin: 3.6 g/dL (ref 3.5–5.0)
BILIRUBIN TOTAL: 0.1 mg/dL — AB (ref 0.3–1.2)
BUN: 15 mg/dL (ref 6–20)
CHLORIDE: 103 mmol/L (ref 101–111)
CO2: 24 mmol/L (ref 22–32)
Calcium: 9 mg/dL (ref 8.9–10.3)
Creatinine, Ser: 0.86 mg/dL (ref 0.44–1.00)
GFR calc Af Amer: >60 mL/min (ref >60)
Glucose, Bld: 84 mg/dL (ref 65–99)
POTASSIUM: 3.7 mmol/L (ref 3.5–5.1)
SODIUM: 137 mmol/L (ref 135–145)
TOTAL PROTEIN: 6.7 g/dL (ref 6.5–8.1)

## 2017-07-08 LAB — URINALYSIS, ROUTINE W REFLEX MICROSCOPIC
Glucose, UA: NEGATIVE mg/dL
Hgb urine dipstick: NEGATIVE
Ketones, ur: 5 mg/dL — AB
Leukocytes, UA: NEGATIVE
NITRITE: NEGATIVE
Protein, ur: NEGATIVE mg/dL
SPECIFIC GRAVITY, URINE: 1.04 — AB (ref 1.005–1.030)
pH: 5 (ref 5.0–8.0)

## 2017-07-08 LAB — LIPASE, BLOOD: LIPASE: 21 U/L (ref 11–51)

## 2017-07-08 MED ORDER — PROMETHAZINE HCL 25 MG/ML IJ SOLN
25.0000 mg | Freq: Once | INTRAMUSCULAR | Status: AC
  Administered 2017-07-08: 25 mg via INTRAMUSCULAR
  Filled 2017-07-08: qty 1

## 2017-07-08 MED ORDER — IOPAMIDOL (ISOVUE-300) INJECTION 61%
100.0000 mL | Freq: Once | INTRAVENOUS | Status: AC | PRN
  Administered 2017-07-08: 100 mL via INTRAVENOUS

## 2017-07-08 MED ORDER — OXYCODONE-ACETAMINOPHEN 5-325 MG PO TABS
2.0000 | ORAL_TABLET | Freq: Once | ORAL | Status: AC
  Administered 2017-07-08: 2 via ORAL
  Filled 2017-07-08: qty 2

## 2017-07-08 NOTE — Discharge Instructions (Signed)

## 2017-07-08 NOTE — MAU Provider Note (Signed)
History  CSN: [id number] Arrival date and time: 07/08/17 1144  First Provider Initiated Contact with Patient 07/08/17 1224      Chief Complaint  Patient presents with  . Abdominal Pain    HPI: Felicia Moore is a 37 y.o. who presents to maternity admissions reporting fever and abdominal pain. She reports she started having left-sided abdominal pain, upper and lower left abdomen, and it has been unrelieved with OTC Tylenol. She also had fever of 101.36F last night, and 100.30F this morning, for which she took Tylenol for. Reports nausea and vomiting as well. No diarrhea. Had a small bowel movement this morning. No blood, no melena. She has history of gastric ulcer, and diverticulosis, and had diverticulitis in the past. Endorses urinary frequency, but denies dysuria or hematuria.  She denies any abnormal vaginal discharge, vaginal bleeding, fevers, chills, malaise, dizziness/lighreadhess, or headache.    OB History  No data available   Past Medical History:  Diagnosis Date  . Breast CA ([place])   . Breast cancer ([place])    left lumpectomy 2006  . Diverticulitis   . Hypertension   . Migraines   . [facility] spotted fever   . Sciatica   . Stroke [facility]) 2013  . Uterine cancer [facility])    Past Surgical History:  Procedure Laterality Date  . ABDOMINAL HYSTERECTOMY    . BREAST LUMPECTOMY Left   . CARPAL TUNNEL RELEASE Right   . CHOLECYSTECTOMY     Social History   Socioeconomic History  . Marital status: Married    Spouse name: Not on file  . Number of children: Not on file  . Years of education: Not on file  . Highest education level: Not on file  Social Needs  . Financial resource strain: Not on file  . Food insecurity - worry: Not on file  . Food insecurity - inability: Not on file  . Transportation needs - medical: Not on file  . Transportation needs - non-medical: Not on file  Occupational History  . Not on file  Tobacco Use  . Smoking status: Current Every Day Smoker     Packs/day: 1.00    Years: 20.00    Pack years: 20.00    Types: Cigarettes  . Smokeless tobacco: Never Used  Substance and Sexual Activity  . Alcohol use: Yes    Comment: rare  . Drug use: No  . Sexual activity: Yes    Birth control/protection: Surgical  Other Topics Concern  . Not on file  Social History Narrative  . Not on file   Allergies  Allergen Reactions  . Reglan [Metoclopramide] Anaphylaxis, Hives and Swelling    Throat swelling  . Toradol [Ketorolac Tromethamine] Anaphylaxis, Hives and Swelling    Throat swelling  . Zofran [Ondansetron Hcl] Anaphylaxis, Hives and Swelling    Throat swelling     Facility-Administered Medications Prior to Admission  Medication Dose Route Frequency Provider Last Rate Last Dose  . 0.9 %  sodium chloride infusion  500 mL Intravenous Continuous Nandigam, Kavitha V, MD       Medications Prior to Admission  Medication Sig Dispense Refill Last Dose  . acetaminophen (TYLENOL) 500 MG tablet Take 1,000 mg by mouth every 6 (six) hours as needed for mild pain or fever.   07/08/2017 at 0800  . Aspirin-Salicylamide-Caffeine (BC FAST PAIN RELIEF) 650-195-33.3 MG PACK Take 1 Package by mouth daily as needed (headache).   Past Week at Unknown time  . carvedilol (COREG) 6.25 MG tablet Take  1 tablet (6.25 mg total) by mouth 2 (two) times daily with a meal. 60 tablet 0 07/08/2017 at 0800  . fluticasone (FLONASE) 50 MCG/ACT nasal spray Place 1 spray into both nostrils 2 (two) times daily.  2 07/08/2017 at Unknown time  . gabapentin (NEURONTIN) 300 MG capsule Take 600 mg by mouth 3 (three) times daily.   2 07/08/2017 at Unknown time  . ipratropium (ATROVENT) 0.03 % nasal spray Place 1 spray into both nostrils daily.  3 07/08/2017 at Unknown time  . lisinopril-hydrochlorothiazide (PRINZIDE,ZESTORETIC) 20-12.5 MG per tablet Take 1 tablet by mouth daily. 30 tablet 0 07/08/2017 at Unknown time  . montelukast (SINGULAIR) 10 MG tablet Take 10 mg by mouth daily.    07/08/2017  at Unknown time  . omeprazole (PRILOSEC) 40 MG capsule Take 40 mg by mouth daily.   07/08/2017 at Unknown time  . PROAIR HFA 108 (90 Base) MCG/ACT inhaler Inhale 2 puffs into the lungs every 4 (four) hours as needed for wheezing or shortness of breath.   11 Past Month at Unknown time  . promethazine (PHENERGAN) 25 MG tablet Take 1 tablet (25 mg total) by mouth every 6 (six) hours as needed for nausea. 20 tablet 0 07/07/2017 at Unknown time  . sucralfate (CARAFATE) 1 g tablet Take 1 tablet (1 g total) by mouth 4 (four) times daily -  with meals and at bedtime. 30 tablet 0 07/08/2017 at Unknown time  . traZODone (DESYREL) 50 MG tablet Take 50 mg by mouth at bedtime as needed for sleep.   0 Past Month at Unknown time    I have reviewed patient's Past Medical Hx, Surgical Hx, Family Hx, Social Hx, medications and allergies.   Review of Systems: Negative except for what is mentioned in HPI.  Physical Exam   Blood pressure 104/62, pulse 95, temperature 98.5 F (36.9 C), temperature source Oral, resp. rate 16, height 5\' 4"  (1.626 m), weight 165 lb (74.8 kg).  Constitutional: Well-developed, well-nourished female in no acute distress.  HENT: [place]/AT, normal oropharynx mucosa. MMM Eyes: normal conjunctivae, no scleral icterus Cardiovascular: normal rate, regular rhythm Respiratory: normal effort, lungs CTAB.  GI: Abd soft, non-distended, moderate TTP on on LUQ and LLQ, w/o rebound or guarding; normoactive bowel sounds throughout.  GU: Neg CVAT bilaterally MSK: Extremities nontender, no edema Neurologic: Alert and oriented x 4. Psych: Normal mood and affect Skin: warm and dry  MAU Course/MDM:   Nursing notes and VS reviewed. She is afebrile here, but reports fever at home, T max 101.6. Took tylenol a couple of hours before arrival.  UA unremarkable; will send for culture given urinary frequency Labs ordered: CBC, CMP, lipase CMP and lipase wnl. CBC with mild leukocytosis. Given her history, with  get CT scan to r/o diverticulitis.   CT scan negative for diverticulitis or other acute process. Showed gastric submucosal thickening, c/w prior studies (has known gastric ulcer) w/o concern for perforation.    Assessment and Plan  Assessment: 1. Nausea and vomiting, intractability of vomiting not specified, unspecified vomiting type   2. Generalized abdominal pain    In the setting of fever at home, norma VS here, and GI symptoms, suspect viral illness.  Plan: --Discussed symptomatic treatment at home; bland diet and po fluids --Continue her gastric ulcer medications (PPI BID, carafate), and recommended follow up with GI for her known gastric ulcer.  --Discharge home in stable condition.   Rodneshia Greenhouse, Jenne Pane, MD 07/08/2017 4:17 PM

## 2017-07-08 NOTE — Progress Notes (Addendum)
Nonpregnant pt here dt fever last night 101.6 but today no fever. Pt was told to come to [facility] by her primary care provider. Now c/o of not being able to empty bladder fully. States been ongoing for past 2-3 days. Also c/o left sided abd pain that started this morning.  Medical hx and medicatons too numerous to list. Please see pt's chart.   1220: Provider at bs  D/c orders given with pt understanding to follow up with primary provider. Pt left unit via ambulatory

## 2017-07-08 NOTE — MAU Note (Addendum)
Patient has been running a fever, still has left ovary, onset yesterday morning. Has history of diverticulosis. Temperature last night 101.6

## 2017-07-10 LAB — URINE CULTURE

## 2017-09-06 ENCOUNTER — Encounter (HOSPITAL_COMMUNITY): Payer: Self-pay | Admitting: Family Medicine

## 2017-09-06 ENCOUNTER — Ambulatory Visit (HOSPITAL_COMMUNITY)
Admission: EM | Admit: 2017-09-06 | Discharge: 2017-09-06 | Disposition: A | Discharge status: Home / Self Care | Payer: Medicaid Other | Attending: Family Medicine | Admitting: Family Medicine

## 2017-09-06 DIAGNOSIS — M779 Enthesopathy, unspecified: Secondary | ICD-10-CM

## 2017-09-06 DIAGNOSIS — R69 Illness, unspecified: Secondary | ICD-10-CM

## 2017-09-06 DIAGNOSIS — J111 Influenza due to unidentified influenza virus with other respiratory manifestations: Secondary | ICD-10-CM

## 2017-09-06 MED ORDER — PREDNISONE 20 MG PO TABS: 40.0000 mg | ORAL_TABLET | Freq: Every day | ORAL | 0 refills | Status: AC

## 2017-09-06 MED ORDER — OSELTAMIVIR PHOSPHATE 75 MG PO CAPS: 75.0000 mg | ORAL_CAPSULE | Freq: Two times a day (BID) | ORAL | 0 refills | Status: AC

## 2017-09-06 NOTE — ED Provider Notes (Signed)
[facility]    CSN: [id number] Arrival date & time: 09/06/17  1035     History   Chief Complaint Chief Complaint  Patient presents with  . URI    HPI Felicia Moore is a 37 y.o. female.   Felicia Moore presents with complaints of fever, tmax of 101.8 which started last night as well as general fatigue, cough, congestion, sore throat, and ear pain. Tylenol has been helpful, last 2 hours ago. Has had coworkers ill. Has had some diarrhea. History of ulcers so attributes diarrhea to this. Did not get a flu vaccine. Body aches and headache. Has had asthma seasonally, has been using her inhaler which helps. Decreased appetite. Urinating.    Also complaints of right forearm and elbow pain which has been ongoing for the past week. Occasional swelling and numbness sensation to her middle three fingers. Full rom to wrist and elbow. No specific known injury. 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 She is right handed. Tylenol has not helped with pain. Due to ulcers does not take NSAIDS.   ROS per HPI.      Past Medical History:  Diagnosis Date  . Breast CA ([place])   . Breast cancer ([place])    left lumpectomy 2006  . Diverticulitis   . Hypertension   . Migraines   . [facility] spotted fever   . Sciatica   . Stroke [facility]) 2013  . Uterine cancer [facility])     Patient Active Problem List   Diagnosis Date Noted  . Low back pain 09/28/2014  . Essential hypertension 09/28/2014  . Allergic rhinitis 09/28/2014    Past Surgical History:  Procedure Laterality Date  . ABDOMINAL HYSTERECTOMY    . BREAST LUMPECTOMY Left   . CARPAL TUNNEL RELEASE Right   . CHOLECYSTECTOMY      OB History   None      Home Medications    Prior to Admission medications   Medication Sig Start Date End Date Taking? Authorizing Provider  acetaminophen (TYLENOL) 500 MG tablet Take 1,000 mg by mouth every 6 (six) hours as needed for mild pain or fever.    [provider]  Aspirin-Salicylamide-Caffeine (BC FAST PAIN RELIEF) 650-195-33.3 MG PACK Take 1 Package by mouth daily as needed (headache).    [provider]  carvedilol (COREG) 6.25 MG tablet Take 1 tablet (6.25 mg total) by mouth 2 (two) times daily with a meal. 10/18/14   Kindl, Nelda Severe, MD  fluticasone (FLONASE) 50 MCG/ACT nasal spray Place 1 spray into both nostrils 2 (two) times daily. 04/10/16   [provider]  gabapentin (NEURONTIN) 300 MG capsule Take 600 mg by mouth 3 (three) times daily.  05/02/16   [provider]  ipratropium (ATROVENT) 0.03 % nasal spray Place 1 spray into both nostrils daily. 06/11/17   [provider]  lisinopril-hydrochlorothiazide (PRINZIDE,ZESTORETIC) 20-12.5 MG per tablet Take 1 tablet by mouth daily. 10/18/14   Billy Fischer, MD  montelukast (SINGULAIR) 10 MG tablet Take 10 mg by mouth daily.     [provider]  omeprazole (PRILOSEC) 40 MG capsule Take 40 mg by mouth daily.    [provider]  oseltamivir (TAMIFLU) 75 MG capsule Take 1 capsule (75 mg total) by mouth every 12 (twelve) hours for 5 days. 09/06/17 09/11/17  Zigmund Gottron, NP  predniSONE (DELTASONE) 20 MG tablet Take 2 tablets (40 mg total) by mouth daily with breakfast for 5 days.  09/06/17 09/11/17  Zigmund Gottron, NP  PROAIR HFA 108 (90 Base) MCG/ACT inhaler Inhale 2 puffs into the lungs every 4 (four) hours as needed for wheezing or shortness of breath.  04/26/16   [provider]  promethazine (PHENERGAN) 25 MG tablet Take 1 tablet (25 mg total) by mouth every 6 (six) hours as needed for nausea. 06/15/16   Domenic Moras, PA-C  sucralfate (CARAFATE) 1 g tablet Take 1 tablet (1 g total) by mouth 4 (four) times daily -  with meals and at bedtime. 08/12/16   Shary Decamp, PA-C  traZODone (DESYREL) 50 MG tablet Take 50 mg by mouth at bedtime as needed for sleep.  03/14/17   [provider]    Family History Family History  Problem  Relation Age of Onset  . Diabetes Mother   . Heart disease Father   . Diabetes Father   . Breast cancer Maternal Grandmother   . Stomach cancer Maternal Grandfather   . Colon cancer Paternal Grandfather     Social History Social History   Tobacco Use  . Smoking status: Current Every Day Smoker    Packs/day: 1.00    Years: 20.00    Pack years: 20.00    Types: Cigarettes  . Smokeless tobacco: Never Used  Substance Use Topics  . Alcohol use: Yes    Comment: rare  . Drug use: No     Allergies   Reglan [metoclopramide]; Toradol [ketorolac tromethamine]; and Zofran [ondansetron hcl]   Review of Systems Review of Systems   Physical Exam Triage Vital Signs ED Triage Vitals  Enc Vitals Group     BP 09/06/17 1104 124/79     Pulse Rate 09/06/17 1104 70     Resp 09/06/17 1104 18     Temp 09/06/17 1104 98.5 F (36.9 C)     Temp src --      SpO2 09/06/17 1104 100 %     Weight --      Height --      Head Circumference --      Peak Flow --      Pain Score 09/06/17 1102 4     Pain Loc --      Pain Edu? --      Excl. in [place]? --    No data found.  Updated Vital Signs BP 124/79   Pulse 70   Temp 98.5 F (36.9 C)   Resp 18   SpO2 100%   Visual Acuity Right Eye Distance:   Left Eye Distance:   Bilateral Distance:    Right Eye Near:   Left Eye Near:    Bilateral Near:     Physical Exam  Constitutional: She is oriented to person, place, and time. She appears well-developed and well-nourished. No distress.  HENT:  Head: Normocephalic and atraumatic.  Right Ear: Tympanic membrane, external ear and ear canal normal.  Left Ear: Tympanic membrane, external ear and ear canal normal.  Nose: Rhinorrhea present. Right sinus exhibits no maxillary sinus tenderness and no frontal sinus tenderness. Left sinus exhibits no maxillary sinus tenderness and no frontal sinus tenderness.  Mouth/Throat: Uvula is midline, oropharynx is clear and moist and mucous membranes are normal.  No tonsillar exudate.  Eyes: Pupils are equal, round, and reactive to light. Conjunctivae and EOM are normal.  Cardiovascular: Normal rate, regular rhythm and normal heart sounds.  Pulmonary/Chest: Effort normal and breath sounds normal.  Abdominal: Soft. She exhibits no distension.  Musculoskeletal:  Right forearm: She exhibits tenderness. She exhibits no bony tenderness, no swelling, no edema, no deformity and no laceration.       Arms: Wrist and elbow with FULL; forearm with mild tenderness; gross sensation intact; strong radial pulse; without obvious swelling noted; without redness  Neurological: She is alert and oriented to person, place, and time.  Skin: Skin is warm and dry.     UC Treatments / Results  Labs (all labs ordered are listed, but only abnormal results are displayed) Labs Reviewed - No data to display  EKG None Radiology No results found.  Procedures Procedures (including critical care time)  Medications Ordered in UC Medications - No data to display   Initial Impression / Assessment and Plan / UC Course  I have reviewed the triage vital signs and the nursing notes.  Pertinent labs & imaging results that were available during my care of the patient were reviewed by me and considered in my medical decision making (see chart for details).    Afebrile. Without significant findings on exam. Had taken tylenol prior to arrival which has helped. Flu exposure at work and with fevers at home, history of asthma. Opted to cover for influenza at this time. Prednisone for right arm pain. Ice, elevation. Activity as tolerated. Return precautions provided. Patient verbalized understanding and agreeable to plan.    Final Clinical Impressions(s) / UC Diagnoses   Final diagnoses:  Influenza-like illness  Tendonitis    ED Discharge Orders        Ordered    predniSONE (DELTASONE) 20 MG tablet  Daily with breakfast     09/06/17 1120    oseltamivir (TAMIFLU) 75 MG  capsule  Every 12 hours     09/06/17 1120       Controlled Substance Prescriptions [place] Controlled Substance Registry consulted? Not Applicable   Zigmund Gottron, NP 09/06/17 1130

## 2017-09-06 NOTE — Discharge Instructions (Signed)
Ice elevation to right forearm. 5 days of prednisone. Tylenol as needed for pain or fevers.  Push fluids to ensure adequate hydration and keep secretions thin.  Complete course of tamiflu. May use over the counter treatments as needed for symptoms. If symptoms worsen or do not improve in the next week to return to be seen or to follow up with your PCP.

## 2017-09-06 NOTE — ED Triage Notes (Signed)
Pt here for URI symptoms since yesterday. sts also right hand redness, pain and swelling. sts this has been intermittent x 1 week. She does a lot of lifting at work. Using ice and tylenol.

## 2017-10-04 ENCOUNTER — Other Ambulatory Visit: Payer: Self-pay

## 2017-10-04 ENCOUNTER — Encounter (HOSPITAL_COMMUNITY): Payer: Self-pay

## 2017-10-04 ENCOUNTER — Ambulatory Visit (HOSPITAL_COMMUNITY)
Admission: EM | Admit: 2017-10-04 | Discharge: 2017-10-04 | Disposition: A | Discharge status: Home / Self Care | Payer: Medicaid Other | Attending: Family Medicine | Admitting: Family Medicine

## 2017-10-04 DIAGNOSIS — Z760 Encounter for issue of repeat prescription: Secondary | ICD-10-CM | POA: Diagnosis not present

## 2017-10-04 DIAGNOSIS — R109 Unspecified abdominal pain: Secondary | ICD-10-CM

## 2017-10-04 DIAGNOSIS — I10 Essential (primary) hypertension: Secondary | ICD-10-CM | POA: Diagnosis not present

## 2017-10-04 MED ORDER — PROMETHAZINE HCL 25 MG PO TABS: 25.0000 mg | ORAL_TABLET | Freq: Four times a day (QID) | ORAL | 0 refills | Status: DC | PRN

## 2017-10-04 MED ORDER — SUCRALFATE 1 G PO TABS: 1.0000 g | ORAL_TABLET | Freq: Three times a day (TID) | ORAL | 0 refills | Status: DC

## 2017-10-04 MED ORDER — LISINOPRIL-HYDROCHLOROTHIAZIDE 20-12.5 MG PO TABS: 1.0000 | ORAL_TABLET | Freq: Every day | ORAL | 0 refills | Status: DC

## 2017-10-04 MED ORDER — OMEPRAZOLE 40 MG PO CPDR: 40.0000 mg | DELAYED_RELEASE_CAPSULE | Freq: Two times a day (BID) | ORAL | 0 refills | Status: DC

## 2017-10-04 MED ORDER — GABAPENTIN 300 MG PO CAPS: 600.0000 mg | ORAL_CAPSULE | Freq: Three times a day (TID) | ORAL | 0 refills | Status: DC

## 2017-10-04 MED ORDER — PROMETHAZINE HCL 25 MG PO TABS: 25.0000 mg | ORAL_TABLET | Freq: Four times a day (QID) | ORAL | 0 refills | Status: AC | PRN

## 2017-10-04 NOTE — ED Provider Notes (Signed)
[place]    CSN: [id number] Arrival date & time: 10/04/17  1926     History   Chief Complaint Chief Complaint  Patient presents with  . Medication Refill    HPI Felicia Moore is a 38 y.o. female history of hypertension, gastritis/gastric ulcers presenting today for medication refill.  Patient states that her insurance recently changed and her PCP that she has been going to for a while is no longer accepting her insurance.  She has an appointment at the end of May to establish care with a new PCP.  She is requesting refill on her hypertension medication as well as her medications for her stomach ulcers.  Patient takes lisinopril-HCTZ daily for her blood pressure, 2 months ago had CMP that revealed normal potassium and kidney function.  Patient takes Carafate and omeprazole daily to prevent ulcers as well as Phenergan as needed.  States she was previously having a flareup of her symptoms and wants to make sure that she has continued medication.  HPI  Past Medical History:  Diagnosis Date  . Breast CA ([place])   . Breast cancer ([place])    left lumpectomy 2006  . Diverticulitis   . Hypertension   . Migraines   . [facility] spotted fever   . Sciatica   . Stroke [facility]) 2013  . Uterine cancer [facility])     Patient Active Problem List   Diagnosis Date Noted  . Low back pain 09/28/2014  . Essential hypertension 09/28/2014  . Allergic rhinitis 09/28/2014    Past Surgical History:  Procedure Laterality Date  . ABDOMINAL HYSTERECTOMY    . BREAST LUMPECTOMY Left   . CARPAL TUNNEL RELEASE Right   . CHOLECYSTECTOMY      OB History   None      Home Medications    Prior to Admission medications   Medication Sig Start Date End Date Taking? Authorizing Provider  montelukast (SINGULAIR) 10 MG tablet Take 10 mg by mouth daily.    Yes [provider]  traZODone (DESYREL) 50 MG tablet Take 50 mg by mouth at bedtime as needed for sleep.  03/14/17  Yes [provider]  acetaminophen (TYLENOL) 500 MG tablet Take 1,000 mg by mouth every 6 (six) hours as needed for mild pain or fever.    [provider]  Aspirin-Salicylamide-Caffeine (BC FAST PAIN RELIEF) 650-195-33.3 MG PACK Take 1 Package by mouth daily as needed (headache).    [provider]  fluticasone (FLONASE) 50 MCG/ACT nasal spray Place 1 spray into both nostrils 2 (two) times daily. 04/10/16   [provider]  gabapentin (NEURONTIN) 300 MG capsule Take 2 capsules (600 mg total) by mouth 3 (three) times daily. 10/04/17 11/03/17  Shawnelle Spoerl C, PA-C  ipratropium (ATROVENT) 0.03 % nasal spray Place 1 spray into both nostrils daily. 06/11/17   [provider]  lisinopril-hydrochlorothiazide (PRINZIDE,ZESTORETIC) 20-12.5 MG tablet Take 1 tablet by mouth daily. 10/04/17 11/03/17  Jone Panebianco C, PA-C  omeprazole (PRILOSEC) 40 MG capsule Take 1 capsule (40 mg total) by mouth 2 (two) times daily. 10/04/17 11/03/17  Aiden Helzer C, PA-C  PROAIR HFA 108 (90 Base) MCG/ACT inhaler Inhale 2 puffs into the lungs every 4 (four) hours as needed for wheezing or shortness of breath.  04/26/16   [provider]  promethazine (PHENERGAN) 25 MG tablet Take 1 tablet (25 mg total) by mouth every 6 (six) hours as needed for nausea. 10/04/17   Caelen Higinbotham, Elesa Hacker, PA-C  sucralfate (CARAFATE) 1 g tablet Take 1 tablet (1 g total) by mouth 4 (four) times daily -  with meals and at bedtime. 10/04/17 11/03/17  Yani Coventry, Elesa Hacker, PA-C    Family History Family History  Problem Relation Age of Onset  . Diabetes Mother   . Heart disease Father   . Diabetes Father   . Breast cancer Maternal Grandmother   . Stomach cancer Maternal Grandfather   . Colon cancer Paternal Grandfather     Social History Social History   Tobacco Use  . Smoking status: Current Every Day Smoker    Packs/day: 1.00    Years: 20.00    Pack years: 20.00    Types: Cigarettes  . Smokeless tobacco: Never Used    Substance Use Topics  . Alcohol use: Yes    Comment: rare  . Drug use: No     Allergies   Reglan [metoclopramide]; Toradol [ketorolac tromethamine]; and Zofran [ondansetron hcl]   Review of Systems Review of Systems  Constitutional: Negative for activity change, appetite change and fatigue.  Eyes: Negative for visual disturbance.  Respiratory: Negative for shortness of breath.   Cardiovascular: Negative for chest pain.  Gastrointestinal: Positive for abdominal pain. Negative for nausea and vomiting.  Musculoskeletal: Negative for back pain and myalgias.  Skin: Negative for rash.  Neurological: Negative for dizziness, syncope, weakness, light-headedness, numbness and headaches.     Physical Exam Triage Vital Signs ED Triage Vitals  Enc Vitals Group     BP 10/04/17 1935 (!) 153/90     Pulse Rate 10/04/17 1935 67     Resp 10/04/17 1935 16     Temp 10/04/17 1935 98.2 F (36.8 C)     Temp Source 10/04/17 1935 Oral     SpO2 10/04/17 1935 98 %     Weight --      Height --      Head Circumference --      Peak Flow --      Pain Score 10/04/17 1937 5     Pain Loc --      Pain Edu? --      Excl. in [place]? --    No data found.  Updated Vital Signs BP (!) 153/90 (BP Location: Left Arm)   Pulse 67   Temp 98.2 F (36.8 C) (Oral)   Resp 16   SpO2 98%   Visual Acuity Right Eye Distance:   Left Eye Distance:   Bilateral Distance:    Right Eye Near:   Left Eye Near:    Bilateral Near:     Physical Exam  Constitutional: She appears well-developed and well-nourished. No distress.  Sitting comfortably on exam table  HENT:  Head: Normocephalic and atraumatic.  Eyes: Conjunctivae are normal.  Neck: Neck supple.  Cardiovascular: Normal rate and regular rhythm.  No murmur heard. Pulmonary/Chest: Effort normal and breath sounds normal. No respiratory distress.  Abdominal: Soft. There is no tenderness.  Abdomen is soft, nondistended, patient does not guard during exam,  nontender to light and deep palpation throughout all 4 quadrants  Musculoskeletal: She exhibits no edema.  Neurological: She is alert.  Skin: Skin is warm and dry.  Psychiatric: She has a normal mood and affect.  Nursing note and vitals reviewed.    UC Treatments / Results  Labs (all labs ordered are listed, but only abnormal results are displayed) Labs Reviewed - No data to display Lab Results  Component Value Date   CREATININE 0.86 07/08/2017  EKG None  Radiology No results found.  Procedures Procedures (including critical care time)  Medications Ordered in UC Medications - No data to display  Initial Impression / Assessment and Plan / UC Course  I have reviewed the triage vital signs and the nursing notes.  Pertinent labs & imaging results that were available during my care of the patient were reviewed by me and considered in my medical decision making (see chart for details).     Medications refilled, patient appears stable at this time, vital signs stable, blood pressure slightly elevated, will advised to monitor at home.  Follow-up with new PCP. Discussed strict return precautions. Patient verbalized understanding and is agreeable with plan.  Final Clinical Impressions(s) / UC Diagnoses   Final diagnoses:  Medication refill     Discharge Instructions     We have refilled your medicines, continue to monitor blood pressure at home, follow up as planned with new PCP.     ED Prescriptions    Medication Sig Dispense Auth. Provider   lisinopril-hydrochlorothiazide (PRINZIDE,ZESTORETIC) 20-12.5 MG tablet  (Status: Discontinued) Take 1 tablet by mouth daily. 30 tablet Mikeila Burgen C, PA-C   omeprazole (PRILOSEC) 40 MG capsule Take 1 capsule (40 mg total) by mouth 2 (two) times daily. 60 capsule Demetria Lightsey C, PA-C   gabapentin (NEURONTIN) 300 MG capsule Take 2 capsules (600 mg total) by mouth 3 (three) times daily. 180 capsule Titilayo Hagans C, PA-C    promethazine (PHENERGAN) 25 MG tablet  (Status: Discontinued) Take 1 tablet (25 mg total) by mouth every 6 (six) hours as needed for nausea. 30 tablet Kenlie Seki C, PA-C   sucralfate (CARAFATE) 1 g tablet  (Status: Discontinued) Take 1 tablet (1 g total) by mouth 4 (four) times daily -  with meals and at bedtime. 120 tablet Mindie Rawdon C, PA-C   sucralfate (CARAFATE) 1 g tablet Take 1 tablet (1 g total) by mouth 4 (four) times daily -  with meals and at bedtime. 120 tablet Jashayla Glatfelter C, PA-C   promethazine (PHENERGAN) 25 MG tablet Take 1 tablet (25 mg total) by mouth every 6 (six) hours as needed for nausea. 30 tablet Chaunice Obie C, PA-C   lisinopril-hydrochlorothiazide (PRINZIDE,ZESTORETIC) 20-12.5 MG tablet Take 1 tablet by mouth daily. 30 tablet Dian Laprade, [place] C, PA-C     Controlled Substance Prescriptions [place] Controlled Substance Registry consulted? Not Applicable   Janith Lima, [place] 10/04/17 2004

## 2017-10-04 NOTE — ED Triage Notes (Signed)
Patient presents to [facility] for medication refill

## 2017-10-04 NOTE — ED Notes (Signed)
Patient discharged by provider.

## 2017-10-04 NOTE — ED Triage Notes (Signed)
Patient presents to [facility] for medication refill for Carafate, phenergan, omeprazole, and lisibopril-HCTZ.

## 2017-10-04 NOTE — Discharge Instructions (Signed)
We have refilled your medicines, continue to monitor blood pressure at home, follow up as planned with new PCP.

## 2017-10-07 ENCOUNTER — Inpatient Hospital Stay (HOSPITAL_COMMUNITY)
Admission: EM | Admit: 2017-10-07 | Discharge: 2017-10-09 | DRG: 377 | Disposition: A | Discharge status: Home / Self Care | Payer: Medicaid Other | Attending: Internal Medicine | Admitting: Internal Medicine

## 2017-10-07 ENCOUNTER — Encounter (HOSPITAL_COMMUNITY): Payer: Self-pay | Admitting: *Deleted

## 2017-10-07 ENCOUNTER — Emergency Department (HOSPITAL_COMMUNITY): Payer: Medicaid Other

## 2017-10-07 ENCOUNTER — Other Ambulatory Visit: Payer: Self-pay

## 2017-10-07 DIAGNOSIS — J690 Pneumonitis due to inhalation of food and vomit: Secondary | ICD-10-CM

## 2017-10-07 DIAGNOSIS — Z803 Family history of malignant neoplasm of breast: Secondary | ICD-10-CM

## 2017-10-07 DIAGNOSIS — Z800 Family history of malignant neoplasm of digestive organs: Secondary | ICD-10-CM

## 2017-10-07 DIAGNOSIS — R197 Diarrhea, unspecified: Secondary | ICD-10-CM

## 2017-10-07 DIAGNOSIS — Z9049 Acquired absence of other specified parts of digestive tract: Secondary | ICD-10-CM

## 2017-10-07 DIAGNOSIS — I10 Essential (primary) hypertension: Secondary | ICD-10-CM | POA: Diagnosis present

## 2017-10-07 DIAGNOSIS — Z8542 Personal history of malignant neoplasm of other parts of uterus: Secondary | ICD-10-CM

## 2017-10-07 DIAGNOSIS — R112 Nausea with vomiting, unspecified: Secondary | ICD-10-CM

## 2017-10-07 DIAGNOSIS — K2971 Gastritis, unspecified, with bleeding: Secondary | ICD-10-CM

## 2017-10-07 DIAGNOSIS — K644 Residual hemorrhoidal skin tags: Secondary | ICD-10-CM | POA: Diagnosis present

## 2017-10-07 DIAGNOSIS — E860 Dehydration: Secondary | ICD-10-CM

## 2017-10-07 DIAGNOSIS — F17210 Nicotine dependence, cigarettes, uncomplicated: Secondary | ICD-10-CM | POA: Diagnosis present

## 2017-10-07 DIAGNOSIS — Z90710 Acquired absence of both cervix and uterus: Secondary | ICD-10-CM

## 2017-10-07 DIAGNOSIS — R1110 Vomiting, unspecified: Secondary | ICD-10-CM

## 2017-10-07 DIAGNOSIS — T39015A Adverse effect of aspirin, initial encounter: Secondary | ICD-10-CM | POA: Diagnosis present

## 2017-10-07 DIAGNOSIS — K3189 Other diseases of stomach and duodenum: Secondary | ICD-10-CM | POA: Diagnosis present

## 2017-10-07 DIAGNOSIS — Z7951 Long term (current) use of inhaled steroids: Secondary | ICD-10-CM

## 2017-10-07 DIAGNOSIS — L500 Allergic urticaria: Secondary | ICD-10-CM | POA: Diagnosis present

## 2017-10-07 DIAGNOSIS — G8929 Other chronic pain: Secondary | ICD-10-CM | POA: Diagnosis present

## 2017-10-07 DIAGNOSIS — Z853 Personal history of malignant neoplasm of breast: Secondary | ICD-10-CM

## 2017-10-07 DIAGNOSIS — Z8673 Personal history of transient ischemic attack (TIA), and cerebral infarction without residual deficits: Secondary | ICD-10-CM

## 2017-10-07 DIAGNOSIS — T402X5A Adverse effect of other opioids, initial encounter: Secondary | ICD-10-CM | POA: Diagnosis present

## 2017-10-07 DIAGNOSIS — T426X5A Adverse effect of other antiepileptic and sedative-hypnotic drugs, initial encounter: Secondary | ICD-10-CM | POA: Diagnosis present

## 2017-10-07 DIAGNOSIS — K922 Gastrointestinal hemorrhage, unspecified: Secondary | ICD-10-CM | POA: Diagnosis present

## 2017-10-07 DIAGNOSIS — T39395A Adverse effect of other nonsteroidal anti-inflammatory drugs [NSAID], initial encounter: Secondary | ICD-10-CM | POA: Diagnosis present

## 2017-10-07 DIAGNOSIS — D62 Acute posthemorrhagic anemia: Secondary | ICD-10-CM | POA: Diagnosis present

## 2017-10-07 DIAGNOSIS — K274 Chronic or unspecified peptic ulcer, site unspecified, with hemorrhage: Principal | ICD-10-CM

## 2017-10-07 DIAGNOSIS — E876 Hypokalemia: Secondary | ICD-10-CM | POA: Diagnosis present

## 2017-10-07 LAB — POC OCCULT BLOOD, ED: FECAL OCCULT BLD: POSITIVE — AB

## 2017-10-07 LAB — URINALYSIS, ROUTINE W REFLEX MICROSCOPIC
Bacteria, UA: NONE SEEN
Bilirubin Urine: NEGATIVE
Glucose, UA: NEGATIVE mg/dL
Ketones, ur: NEGATIVE mg/dL
LEUKOCYTES UA: NEGATIVE
Nitrite: NEGATIVE
PH: 6 (ref 5.0–8.0)
Protein, ur: NEGATIVE mg/dL
SPECIFIC GRAVITY, URINE: 1.031 — AB (ref 1.005–1.030)

## 2017-10-07 LAB — COMPREHENSIVE METABOLIC PANEL
ALBUMIN: 4.1 g/dL (ref 3.5–5.0)
ALT: 16 U/L (ref 14–54)
AST: 21 U/L (ref 15–41)
Alkaline Phosphatase: 93 U/L (ref 38–126)
Anion gap: 15 (ref 5–15)
BUN: 15 mg/dL (ref 6–20)
CHLORIDE: 101 mmol/L (ref 101–111)
CO2: 21 mmol/L — ABNORMAL LOW (ref 22–32)
CREATININE: 0.96 mg/dL (ref 0.44–1.00)
Calcium: 9.4 mg/dL (ref 8.9–10.3)
GFR calc Af Amer: >60 mL/min (ref >60)
GFR calc non Af Amer: >60 mL/min (ref >60)
Glucose, Bld: 177 mg/dL — ABNORMAL HIGH (ref 65–99)
Potassium: 4.7 mmol/L (ref 3.5–5.1)
SODIUM: 137 mmol/L (ref 135–145)
Total Bilirubin: 0.4 mg/dL (ref 0.3–1.2)
Total Protein: 7.1 g/dL (ref 6.5–8.1)

## 2017-10-07 LAB — I-STAT CG4 LACTIC ACID, ED: Lactic Acid, Venous: 2.3 mmol/L (ref 0.5–1.9)

## 2017-10-07 LAB — CBC
HCT: 44.5 % (ref 36.0–46.0)
Hemoglobin: 14.1 g/dL (ref 12.0–15.0)
MCH: 31.3 pg (ref 26.0–34.0)
MCHC: 31.7 g/dL (ref 30.0–36.0)
MCV: 98.7 fL (ref 78.0–100.0)
PLATELETS: 370 10*3/uL (ref 150–400)
RBC: 4.51 MIL/uL (ref 3.87–5.11)
RDW: 14 % (ref 11.5–15.5)
WBC: 15.6 10*3/uL — AB (ref 4.0–10.5)

## 2017-10-07 LAB — RAPID URINE DRUG SCREEN, HOSP PERFORMED
AMPHETAMINES: NOT DETECTED
BENZODIAZEPINES: NOT DETECTED
Barbiturates: NOT DETECTED
Cocaine: NOT DETECTED
Opiates: POSITIVE — AB
TETRAHYDROCANNABINOL: NOT DETECTED

## 2017-10-07 LAB — LIPASE, BLOOD: Lipase: 31 U/L (ref 11–51)

## 2017-10-07 MED ORDER — DIPHENHYDRAMINE HCL 50 MG/ML IJ SOLN
50.0000 mg | Freq: Once | INTRAMUSCULAR | Status: AC
  Administered 2017-10-07: 50 mg via INTRAVENOUS
  Filled 2017-10-07: qty 1

## 2017-10-07 MED ORDER — FAMOTIDINE IN NACL 20-0.9 MG/50ML-% IV SOLN
20.0000 mg | Freq: Two times a day (BID) | INTRAVENOUS | Status: DC
  Administered 2017-10-07 – 2017-10-08 (×3): 20 mg via INTRAVENOUS
  Filled 2017-10-07 (×3): qty 50

## 2017-10-07 MED ORDER — IOHEXOL 300 MG/ML  SOLN
100.0000 mL | Freq: Once | INTRAMUSCULAR | Status: AC | PRN
  Administered 2017-10-07: 100 mL via INTRAVENOUS

## 2017-10-07 MED ORDER — FAMOTIDINE IN NACL 20-0.9 MG/50ML-% IV SOLN
20.0000 mg | Freq: Once | INTRAVENOUS | Status: AC
  Administered 2017-10-07: 20 mg via INTRAVENOUS
  Filled 2017-10-07: qty 50

## 2017-10-07 MED ORDER — SODIUM CHLORIDE 0.9 % IV BOLUS
1000.0000 mL | Freq: Once | INTRAVENOUS | Status: AC
  Administered 2017-10-07: 1000 mL via INTRAVENOUS

## 2017-10-07 MED ORDER — GABAPENTIN 300 MG PO CAPS
600.0000 mg | ORAL_CAPSULE | Freq: Three times a day (TID) | ORAL | Status: DC
  Administered 2017-10-07 – 2017-10-09 (×6): 600 mg via ORAL
  Filled 2017-10-07 (×6): qty 2

## 2017-10-07 MED ORDER — LISINOPRIL 20 MG PO TABS
20.0000 mg | ORAL_TABLET | Freq: Every day | ORAL | Status: DC
  Filled 2017-10-07: qty 1

## 2017-10-07 MED ORDER — SODIUM CHLORIDE 0.9 % IV SOLN
INTRAVENOUS | Status: DC
  Administered 2017-10-07 – 2017-10-08 (×2): via INTRAVENOUS

## 2017-10-07 MED ORDER — DOXYCYCLINE HYCLATE 100 MG PO TABS
100.0000 mg | ORAL_TABLET | Freq: Once | ORAL | Status: DC
  Filled 2017-10-07: qty 1

## 2017-10-07 MED ORDER — ACETAMINOPHEN 325 MG PO TABS
650.0000 mg | ORAL_TABLET | Freq: Four times a day (QID) | ORAL | Status: DC | PRN
  Administered 2017-10-08 (×2): 650 mg via ORAL
  Filled 2017-10-07 (×3): qty 2

## 2017-10-07 MED ORDER — PROMETHAZINE HCL 25 MG/ML IJ SOLN
25.0000 mg | Freq: Once | INTRAMUSCULAR | Status: AC
  Administered 2017-10-07: 25 mg via INTRAVENOUS
  Filled 2017-10-07: qty 1

## 2017-10-07 MED ORDER — TRAZODONE HCL 50 MG PO TABS
50.0000 mg | ORAL_TABLET | Freq: Every evening | ORAL | Status: DC | PRN
  Administered 2017-10-07 – 2017-10-08 (×2): 50 mg via ORAL
  Filled 2017-10-07 (×2): qty 1

## 2017-10-07 MED ORDER — PROCHLORPERAZINE EDISYLATE 10 MG/2ML IJ SOLN
10.0000 mg | Freq: Once | INTRAMUSCULAR | Status: AC
  Administered 2017-10-07: 10 mg via INTRAVENOUS
  Filled 2017-10-07: qty 2

## 2017-10-07 MED ORDER — ALBUTEROL SULFATE (2.5 MG/3ML) 0.083% IN NEBU
2.5000 mg | INHALATION_SOLUTION | RESPIRATORY_TRACT | Status: DC | PRN
Start: 2017-10-07 — End: 2017-10-09

## 2017-10-07 MED ORDER — SUCRALFATE 1 G PO TABS
1.0000 g | ORAL_TABLET | Freq: Three times a day (TID) | ORAL | Status: DC
  Administered 2017-10-07 – 2017-10-09 (×7): 1 g via ORAL
  Filled 2017-10-07 (×7): qty 1

## 2017-10-07 MED ORDER — ZOLPIDEM TARTRATE 5 MG PO TABS
5.0000 mg | ORAL_TABLET | Freq: Every evening | ORAL | Status: DC | PRN
Start: 2017-10-07 — End: 2017-10-07

## 2017-10-07 MED ORDER — SODIUM CHLORIDE 0.9 % IV SOLN
1.0000 g | Freq: Once | INTRAVENOUS | Status: AC
  Administered 2017-10-07: 1 g via INTRAVENOUS
  Filled 2017-10-07: qty 10

## 2017-10-07 MED ORDER — ACETAMINOPHEN 650 MG RE SUPP: 650.0000 mg | Freq: Four times a day (QID) | RECTAL | Status: DC | PRN

## 2017-10-07 MED ORDER — HYDROMORPHONE HCL 2 MG/ML IJ SOLN
1.0000 mg | Freq: Once | INTRAMUSCULAR | Status: AC
  Administered 2017-10-07: 1 mg via INTRAVENOUS
  Filled 2017-10-07: qty 1

## 2017-10-07 MED ORDER — LISINOPRIL-HYDROCHLOROTHIAZIDE 20-12.5 MG PO TABS: 1.0000 | ORAL_TABLET | Freq: Every day | ORAL | Status: DC

## 2017-10-07 MED ORDER — MORPHINE SULFATE (PF) 4 MG/ML IV SOLN
4.0000 mg | Freq: Once | INTRAVENOUS | Status: AC
  Administered 2017-10-07: 4 mg via INTRAVENOUS
  Filled 2017-10-07: qty 1

## 2017-10-07 MED ORDER — HYDROCHLOROTHIAZIDE 12.5 MG PO CAPS
12.5000 mg | ORAL_CAPSULE | Freq: Every day | ORAL | Status: DC
Start: 2017-10-07 — End: 2017-10-08
  Administered 2017-10-08: 12.5 mg via ORAL
  Filled 2017-10-07: qty 1

## 2017-10-07 MED ORDER — PROMETHAZINE HCL 25 MG/ML IJ SOLN
25.0000 mg | INTRAMUSCULAR | Status: DC | PRN
Start: 2017-10-07 — End: 2017-10-09

## 2017-10-07 MED ORDER — OXYCODONE HCL 5 MG PO TABS
5.0000 mg | ORAL_TABLET | ORAL | Status: DC | PRN
  Administered 2017-10-07 – 2017-10-08 (×2): 5 mg via ORAL
  Filled 2017-10-07 (×2): qty 1

## 2017-10-07 MED ORDER — IPRATROPIUM BROMIDE 0.06 % NA SOLN
1.0000 | Freq: Every day | NASAL | Status: DC
  Administered 2017-10-07: 1 via NASAL
  Filled 2017-10-07: qty 15

## 2017-10-07 MED ORDER — ALBUTEROL SULFATE HFA 108 (90 BASE) MCG/ACT IN AERS: 2.0000 | INHALATION_SPRAY | RESPIRATORY_TRACT | Status: DC | PRN

## 2017-10-07 MED ORDER — SODIUM CHLORIDE 0.9 % IV SOLN
500.0000 mg | Freq: Once | INTRAVENOUS | Status: AC
  Administered 2017-10-07: 500 mg via INTRAVENOUS
  Filled 2017-10-07: qty 500

## 2017-10-07 NOTE — ED Notes (Signed)
Pt states she is un able to tolerqate po meds currently and ask to wait .

## 2017-10-07 NOTE — ED Provider Notes (Addendum)
[place] [facility] EMERGENCY DEPARTMENT Provider Note   CSN: [id number] Arrival date & time: 10/07/17  0501     History   Chief Complaint Chief Complaint  Patient presents with  . Emesis  . GI Bleeding    HPI Felicia Moore is a 37 y.o. female.  HPI   37 year old female with history of gastritis currently on Carafate and Prilosec, history of diverticulitis, history of breast cancer status post left lumpectomy in 2006 presenting for evaluation of abdominal pain.  Patient report acute onset of upper abdominal pain that started last night.  Pain is sharp, crampy, nonradiating, persistent.  She also endorsed nausea, has vomited multiple episodes of bloody content as well as having diarrhea with bright red blood.  She felt that her symptoms is similar to prior stomach ulcer.  She denies any recent alcohol use, marijuana use, or change in her diet.  She denies having fever but does endorse short chills.  No chest pain, shortness of breath, productive cough, or dysuria.  She is not on any blood thinner medication.  She was unable to keep her Phenergan down at home.  Past Medical History:  Diagnosis Date  . Breast CA ([place])   . Breast cancer ([place])    left lumpectomy 2006  . Diverticulitis   . Hypertension   . Migraines   . [facility] spotted fever   . Sciatica   . Stroke [facility]) 2013  . Uterine cancer [facility])     Patient Active Problem List   Diagnosis Date Noted  . Low back pain 09/28/2014  . Essential hypertension 09/28/2014  . Allergic rhinitis 09/28/2014    Past Surgical History:  Procedure Laterality Date  . ABDOMINAL HYSTERECTOMY    . BREAST LUMPECTOMY Left   . CARPAL TUNNEL RELEASE Right   . CHOLECYSTECTOMY       OB History   None      Home Medications    Prior to Admission medications   Medication Sig Start Date End Date Taking? Authorizing Provider  acetaminophen (TYLENOL) 500 MG tablet Take 1,000 mg by mouth every 6 (six) hours as needed for  mild pain or fever.    [provider]  Aspirin-Salicylamide-Caffeine (BC FAST PAIN RELIEF) 650-195-33.3 MG PACK Take 1 Package by mouth daily as needed (headache).    [provider]  fluticasone (FLONASE) 50 MCG/ACT nasal spray Place 1 spray into both nostrils 2 (two) times daily. 04/10/16   [provider]  gabapentin (NEURONTIN) 300 MG capsule Take 2 capsules (600 mg total) by mouth 3 (three) times daily. 10/04/17 11/03/17  Wieters, Hallie C, PA-C  ipratropium (ATROVENT) 0.03 % nasal spray Place 1 spray into both nostrils daily. 06/11/17   [provider]  lisinopril-hydrochlorothiazide (PRINZIDE,ZESTORETIC) 20-12.5 MG tablet Take 1 tablet by mouth daily. 10/04/17 11/03/17  Wieters, Hallie C, PA-C  montelukast (SINGULAIR) 10 MG tablet Take 10 mg by mouth daily.     [provider]  omeprazole (PRILOSEC) 40 MG capsule Take 1 capsule (40 mg total) by mouth 2 (two) times daily. 10/04/17 11/03/17  Wieters, Hallie C, PA-C  PROAIR HFA 108 (90 Base) MCG/ACT inhaler Inhale 2 puffs into the lungs every 4 (four) hours as needed for wheezing or shortness of breath.  04/26/16   [provider]  promethazine (PHENERGAN) 25 MG tablet Take 1 tablet (25 mg total) by mouth every 6 (six) hours as needed for nausea. 10/04/17   Wieters, Hallie C, PA-C  sucralfate (CARAFATE) 1 g tablet  Take 1 tablet (1 g total) by mouth 4 (four) times daily -  with meals and at bedtime. 10/04/17 11/03/17  Wieters, Hallie C, PA-C  traZODone (DESYREL) 50 MG tablet Take 50 mg by mouth at bedtime as needed for sleep.  03/14/17   [provider]    Family History Family History  Problem Relation Age of Onset  . Diabetes Mother   . Heart disease Father   . Diabetes Father   . Breast cancer Maternal Grandmother   . Stomach cancer Maternal Grandfather   . Colon cancer Paternal Grandfather     Social History Social History   Tobacco Use  . Smoking status: Current Every Day Smoker     Packs/day: 1.00    Years: 20.00    Pack years: 20.00    Types: Cigarettes  . Smokeless tobacco: Never Used  Substance Use Topics  . Alcohol use: Yes    Comment: rare  . Drug use: No     Allergies   Reglan [metoclopramide]; Toradol [ketorolac tromethamine]; and Zofran [ondansetron hcl]   Review of Systems Review of Systems  All other systems reviewed and are negative.    Physical Exam Updated Vital Signs BP (!) 184/100   Pulse 97   Resp 16   SpO2 98%   Physical Exam  Constitutional: She appears well-developed and well-nourished. No distress.  Patient appears uncomfortable, retching, has an emesis bag filled with liquid fluid and trace of blood.  HENT:  Head: Atraumatic.  Eyes: Conjunctivae are normal.  Neck: Neck supple.  Cardiovascular: Normal rate and regular rhythm.  Pulmonary/Chest: Effort normal and breath sounds normal.  Abdominal: Soft. She exhibits no distension. There is tenderness (Diffuse abdominal tenderness most significant at the epigastrium without guarding or rebound tenderness.).  Genitourinary:  Genitourinary Comments: Chaperone present during exam.  Normal rectal tone, external hemorrhoid noted.  No obvious mass, trace of blood noted on glove, fecal occult blood test positive.  Neurological: She is alert.  Skin: No rash noted.  Psychiatric: She has a normal mood and affect.  Nursing note and vitals reviewed.    ED Treatments / Results  Labs (all labs ordered are listed, but only abnormal results are displayed) Labs Reviewed  COMPREHENSIVE METABOLIC PANEL - Abnormal; Notable for the following components:      Result Value   CO2 21 (*)    Glucose, Bld 177 (*)    All other components within normal limits  CBC - Abnormal; Notable for the following components:   WBC 15.6 (*)    All other components within normal limits  POC OCCULT BLOOD, ED - Abnormal; Notable for the following components:   Fecal Occult Bld POSITIVE (*)    All other  components within normal limits  I-STAT CG4 LACTIC ACID, ED - Abnormal; Notable for the following components:   Lactic Acid, Venous 2.30 (*)    All other components within normal limits  LIPASE, BLOOD  URINALYSIS, ROUTINE W REFLEX MICROSCOPIC  RAPID URINE DRUG SCREEN, HOSP PERFORMED    EKG None  Radiology Ct Abdomen Pelvis W Contrast  Result Date: 10/07/2017 CLINICAL DATA:  37 year old female with history of upper abdominal pain and nausea and vomiting since yesterday. EXAM: CT ABDOMEN AND PELVIS WITH CONTRAST TECHNIQUE: Multidetector CT imaging of the abdomen and pelvis was performed using the standard protocol following bolus administration of intravenous contrast. CONTRAST:  183mL OMNIPAQUE IOHEXOL 300 MG/ML  SOLN COMPARISON:  CT the abdomen and pelvis 07/08/2017. FINDINGS: Lower chest: Patchy airspace  disease in the lung bases bilaterally (right lower lobe much greater than left lower lobe). Small hiatal hernia. Hepatobiliary: No suspicious cystic or solid hepatic lesions. No intra or extrahepatic biliary ductal dilatation. Status post cholecystectomy. Pancreas: No pancreatic mass. No pancreatic ductal dilatation. No pancreatic or peripancreatic fluid or inflammatory changes. Small calcification in the uncinate process of the pancreas incidentally noted. Spleen: Unremarkable. Adrenals/Urinary Tract: Bilateral kidneys and bilateral adrenal glands are normal in appearance. No hydroureteronephrosis. Urinary bladder is normal in appearance. Stomach/Bowel: Normal appearance of the stomach. No pathologic dilatation of small bowel or colon. Normal appendix. Vascular/Lymphatic: No significant atherosclerotic disease, aneurysm or dissection noted in the abdominal or pelvic vasculature. No lymphadenopathy noted in the abdomen or pelvis. Reproductive: Status post hysterectomy. Ovaries are unremarkable in appearance. Other: No significant volume of ascites.  No pneumoperitoneum. Musculoskeletal: There are no  aggressive appearing lytic or blastic lesions noted in the visualized portions of the skeleton. IMPRESSION: 1. No acute findings are noted in the abdomen or pelvis to account for the patient's symptoms. 2. However, there is patchy multifocal airspace disease in the visualize lung bases, most severe in the right lower lobe. Clinical correlation for signs of developing infection or evidence of recent aspiration is recommended. 3. Normal appendix. Electronically Signed   By: Vinnie Langton M.D.   On: 10/07/2017 08:32    Procedures Procedures (including critical care time)  EMERGENCY DEPARTMENT  US GUIDANCE EXAM Emergency Ultrasound:  US Guidance for Needle Guidance  INDICATIONS: Difficult vascular access Linear probe used in real-time to visualize location of needle entry through skin.   PERFORMED BY: Myself IMAGES ARCHIVED?: No LIMITATIONS: Pain VIEWS USED: Transverse INTERPRETATION: Needle visualized within vein, Right arm and Needle gauge 20   Medications Ordered in ED Medications  doxycycline (VIBRA-TABS) tablet 100 mg (has no administration in time range)  promethazine (PHENERGAN) injection 25 mg (25 mg Intravenous Given 10/07/17 0629)  sodium chloride 0.9 % bolus 1,000 mL (0 mLs Intravenous Stopped 10/07/17 0801)  famotidine (PEPCID) IVPB 20 mg premix (0 mg Intravenous Stopped 10/07/17 0801)  morphine 4 MG/ML injection 4 mg (4 mg Intravenous Given 10/07/17 0635)  diphenhydrAMINE (BENADRYL) injection 50 mg (50 mg Intravenous Given 10/07/17 0642)  iohexol (OMNIPAQUE) 300 MG/ML solution 100 mL (100 mLs Intravenous Contrast Given 10/07/17 0732)  sodium chloride 0.9 % bolus 1,000 mL (1,000 mLs Intravenous New Bag/Given 10/07/17 0904)  cefTRIAXone (ROCEPHIN) 1 g in sodium chloride 0.9 % 100 mL IVPB (1 g Intravenous New Bag/Given 10/07/17 0948)  prochlorperazine (COMPAZINE) injection 10 mg (10 mg Intravenous Given 10/07/17 0942)  morphine 4 MG/ML injection 4 mg (4 mg Intravenous Given 10/07/17 0948)      Initial Impression / Assessment and Plan / ED Course  I have reviewed the triage vital signs and the nursing notes.  Pertinent labs & imaging results that were available during my care of the patient were reviewed by me and considered in my medical decision making (see chart for details).     BP (!) 184/100   Pulse 97   Resp 16   SpO2 98%    Final Clinical Impressions(s) / ED Diagnoses   Final diagnoses:  Nausea vomiting and diarrhea  Dehydration  Aspiration pneumonia of right lower lobe due to vomit [facility])  Gastrointestinal hemorrhage associated with peptic ulcer    ED Discharge Orders    None     6:16 AM Patient with history of diverticulitis, and history of PUD here with abdominal pain, nausea vomiting diarrhea with  evidence of blood.  Suspect blood emesis is likely due to [employer] tear, I have low suspicion for [place].  She is hypertensive with a blood pressure 184/100.  This is likely due to pain response.  Patient given antinausea medication, IV fluid, and Pepcid via IV.  Plan to obtain abdomen pelvis CT scan for evaluation.  6:40 AM Due to difficult IV access, I perform a bedside ultrasound IV guided vascular access.  Pt did develop urticaria after receiving morphine and Phenergan.  Benadryl given.  7:08 AM Patient  report diarrhea as well as blood in her stool that started this morning.  No black tarry stools.  Did report the use of NSAIDs on occasion most recent was this morning.  She denies alcohol use.  8:59 AM Patient has an elevated white count of 15.6.  Normal hemoglobin of 14.1.  Fecal occult blood test is positive.  Abdominal pelvis CT scan without any acute finding in her abdomen and pelvis however there is patchy multifocal focal airspace disease most severe in the right lower lung concerning for developing infections or recent aspiration.  Given this finding, patient will benefit from antibiotic to treat for potential aspiration  pneumonia.  10:16 AM Lactic acid is elevated at 2.3.  This could be due to persistent nausea vomiting diarrhea and reflect dehydration however elevated lactic acid also be related to underlying infectious etiology. Pt however not febrile, not tachycardic or hypotensive.  Therefore, code sepsis was not activated.  At this time, patient cannot tolerate p.o.  She have receive antibiotic to cover for potential communicable acquired pneumonia with Rocephin and doxycycline.  She cannot take doxycycline at this time.  She still endorse nausea.  We will continue with IV hydration at 30 cc/kg and may consider admission if no improvement.  10:47 AM Patient reports some improvement with current treatment however still endorse nausea.  At this time, patient does not feel comfortable going home due to her recurrent symptoms.    11:00 AM Appreciate consultation from [place], [place] who agrees to see and admit pt for further management.       Domenic Moras, PA-C 10/07/17 1103    Tanna Furry, MD 10/08/17 [phone]  ADDENDUM: Critical Care time added  CRITICAL CARE Performed by: Domenic Moras Total critical care time: 35 minutes Critical care time was exclusive of separately billable procedures and treating other patients. Critical care was necessary to treat or prevent imminent or life-threatening deterioration. Critical care was time spent personally by me on the following activities: development of treatment plan with patient and/or surrogate as well as nursing, discussions with consultants, evaluation of patient's response to treatment, examination of patient, obtaining history from patient or surrogate, ordering and performing treatments and interventions, ordering and review of laboratory studies, ordering and review of radiographic studies, pulse oximetry and re-evaluation of patient's condition.    Domenic Moras, PA-C 10/20/17 Dyann Kief    Tanna Furry, MD 10/23/17 [phone]

## 2017-10-07 NOTE — ED Triage Notes (Signed)
Pt c/o NV, abd pain, and bloody diarrhea for the past couple of days. Pt currently vomiting in triage

## 2017-10-07 NOTE — ED Notes (Signed)
IV attempted x2 without success.

## 2017-10-07 NOTE — Progress Notes (Signed)
Patient trasfered from [place] to [phone] via wheelchair; alert and oriented x 4; no complaints of pain; IV saline locked in [place] and fluids running in RUA; skin intact. Orient patient to room and unit; gave patient care guide; instructed how to use the call bell and  fall risk precautions. Will continue to monitor the patient.

## 2017-10-07 NOTE — H&P (Addendum)
History and Physical    Monte Bronder YQI:[id number] DOB: March 24, 1981 DOA: 10/07/2017  PCP: Javier Docker, MD Patient coming from: home  Chief Complaint: Nausea, Vomiting and Diarrhea.  Pt vomiting up blood.   HPI: Felicia Moore is a 37 y.o. female with medical history significant of gastritis, ulcer and diverticulitis who presents today with abdominal pain, vomiting and diarrhea since last pm.  Pt reports seeing blood in vomit.  Pt reports she has not been able to drink anything.  She vomits when she tries.  Pt reports she feels like she has in the pst when she has had stomach ulcers.  Pt also  has a history of breast cancer.  She had a left lumpectomy in 2006.  Pt reports normal mammograms since. Pt has a history of headaches.  Pt reports she took BC powders last week.  Pt reports she took one yesterday. Pt has had a history of bleeding with taking similar products in the past.   Pt tried phenergan at home with no relief.      ED Course: Pt  Observed vomiting.  Pt noted to have blood in vomit.  Pt was hemocult positive on stool exam. Pt had WBC's elevated to 15.6 and lactic acid of 2.30.  Ct scan shows no acute findings of abdomen, however there is patchy multifocal disease.  Pt has had iv fluids and Rocephn IV.    Review of Systems: As per HPI otherwise all other systems reviewed and are negative  Review of Systems  Gastrointestinal: Positive for abdominal pain, blood in stool, diarrhea, nausea and vomiting.  Neurological: Positive for headaches.  All other systems reviewed and are negative.  Ambulatory Status:ambulatory   Past Medical History:  Diagnosis Date  . Breast CA ([place])   . Breast cancer ([place])    left lumpectomy 2006  . Diverticulitis   . Hypertension   . Migraines   . [facility] spotted fever   . Sciatica   . Stroke [facility]) 2013  . Uterine cancer [facility])     Past Surgical History:  Procedure Laterality Date  . ABDOMINAL HYSTERECTOMY    . BREAST  LUMPECTOMY Left   . CARPAL TUNNEL RELEASE Right   . CHOLECYSTECTOMY      Social History   Socioeconomic History  . Marital status: Married    Spouse name: Not on file  . Number of children: Not on file  . Years of education: Not on file  . Highest education level: Not on file  Occupational History  . Not on file  Social Needs  . Financial resource strain: Not on file  . Food insecurity:    Worry: Not on file    Inability: Not on file  . Transportation needs:    Medical: Not on file    Non-medical: Not on file  Tobacco Use  . Smoking status: Current Every Day Smoker    Packs/day: 1.00    Years: 20.00    Pack years: 20.00    Types: Cigarettes  . Smokeless tobacco: Never Used  Substance and Sexual Activity  . Alcohol use: Yes    Comment: rare  . Drug use: No  . Sexual activity: Yes    Birth control/protection: Surgical  Lifestyle  . Physical activity:    Days per week: Not on file    Minutes per session: Not on file  . Stress: Not on file  Relationships  . Social connections:    Talks on phone: Not on file  Gets together: Not on file    Attends religious service: Not on file    Active member of club or organization: Not on file    Attends meetings of clubs or organizations: Not on file    Relationship status: Not on file  . Intimate partner violence:    Fear of current or ex partner: Not on file    Emotionally abused: Not on file    Physically abused: Not on file    Forced sexual activity: Not on file  Other Topics Concern  . Not on file  Social History Narrative  . Not on file    Allergies  Allergen Reactions  . Reglan [Metoclopramide] Anaphylaxis, Hives and Swelling    Throat swelling  . Toradol [Ketorolac Tromethamine] Anaphylaxis, Hives and Swelling    Throat swelling  . Zofran [Ondansetron Hcl] Anaphylaxis, Hives and Swelling    Throat swelling     Family History  Problem Relation Age of Onset  . Diabetes Mother   . Heart disease Father     . Diabetes Father   . Breast cancer Maternal Grandmother   . Stomach cancer Maternal Grandfather   . Colon cancer Paternal Grandfather     Prior to Admission medications   Medication Sig Start Date End Date Taking? Authorizing Provider  acetaminophen (TYLENOL) 500 MG tablet Take 1,000 mg by mouth every 6 (six) hours as needed for mild pain or fever.    [provider]  Aspirin-Salicylamide-Caffeine (BC FAST PAIN RELIEF) 650-195-33.3 MG PACK Take 1 Package by mouth daily as needed (headache).    [provider]  fluticasone (FLONASE) 50 MCG/ACT nasal spray Place 1 spray into both nostrils 2 (two) times daily. 04/10/16   [provider]  gabapentin (NEURONTIN) 300 MG capsule Take 2 capsules (600 mg total) by mouth 3 (three) times daily. 10/04/17 11/03/17  Wieters, Hallie C, PA-C  ipratropium (ATROVENT) 0.03 % nasal spray Place 1 spray into both nostrils daily. 06/11/17   [provider]  lisinopril-hydrochlorothiazide (PRINZIDE,ZESTORETIC) 20-12.5 MG tablet Take 1 tablet by mouth daily. 10/04/17 11/03/17  Wieters, Hallie C, PA-C  montelukast (SINGULAIR) 10 MG tablet Take 10 mg by mouth daily.     [provider]  omeprazole (PRILOSEC) 40 MG capsule Take 1 capsule (40 mg total) by mouth 2 (two) times daily. 10/04/17 11/03/17  Wieters, Hallie C, PA-C  PROAIR HFA 108 (90 Base) MCG/ACT inhaler Inhale 2 puffs into the lungs every 4 (four) hours as needed for wheezing or shortness of breath.  04/26/16   [provider]  promethazine (PHENERGAN) 25 MG tablet Take 1 tablet (25 mg total) by mouth every 6 (six) hours as needed for nausea. 10/04/17   Wieters, Hallie C, PA-C  sucralfate (CARAFATE) 1 g tablet Take 1 tablet (1 g total) by mouth 4 (four) times daily -  with meals and at bedtime. 10/04/17 11/03/17  Wieters, Hallie C, PA-C  traZODone (DESYREL) 50 MG tablet Take 50 mg by mouth at bedtime as needed for sleep.  03/14/17   [provider]    Physical  Exam: Vitals:   10/07/17 1015 10/07/17 1030 10/07/17 1045 10/07/17 1100  BP: 118/78 120/81 (!) 140/97 137/86  Pulse: 81 77 96 77  Resp:    16  Temp:    100.3 F (37.9 C)  TempSrc:    Rectal  SpO2: 100% 100% 99% 100%     General:  Appears calm and comfortable Eyes:  PERRL, EOMI, normal lids, iris ENT:  grossly normal hearing, lips & tongue, mmm Neck:  no LAD, masses or thyromegaly Cardiovascular:  RRR, no m/r/g. No LE edema.  Respiratory:  CTA bilaterally, no w/r/r. Normal respiratory effort. Abdomen:  soft, diffusely tender NABS Skin:  no rash or induration seen on limited exam Musculoskeletal:  grossly normal tone BUE/BLE, good ROM, no bony abnormality Psychiatric:  grossly normal mood and affect, speech fluent and appropriate, AOx3 Neurologic:  CN 2-12 grossly intact, moves all extremities in coordinated fashion, sensation intact  Labs on Admission: I have personally reviewed following labs and imaging studies  CBC: Recent Labs  Lab 10/07/17 0614  WBC 15.6*  HGB 14.1  HCT 44.5  MCV 98.7  PLT 947   Basic Metabolic Panel: Recent Labs  Lab 10/07/17 0614  NA 137  K 4.7  CL 101  CO2 21*  GLUCOSE 177*  BUN 15  CREATININE 0.96  CALCIUM 9.4   GFR: CrCl cannot be calculated (Unknown ideal weight.). Liver Function Tests: Recent Labs  Lab 10/07/17 0614  AST 21  ALT 16  ALKPHOS 93  BILITOT 0.4  PROT 7.1  ALBUMIN 4.1   Recent Labs  Lab 10/07/17 0614  LIPASE 31   No results for input(s): AMMONIA in the last 168 hours. Coagulation Profile: No results for input(s): INR, PROTIME in the last 168 hours. Cardiac Enzymes: No results for input(s): CKTOTAL, CKMB, CKMBINDEX, TROPONINI in the last 168 hours. BNP (last 3 results) No results for input(s): PROBNP in the last 8760 hours. HbA1C: No results for input(s): HGBA1C in the last 72 hours. CBG: No results for input(s): GLUCAP in the last 168 hours. Lipid Profile: No results for input(s): CHOL, HDL,  LDLCALC, TRIG, CHOLHDL, LDLDIRECT in the last 72 hours. Thyroid Function Tests: No results for input(s): TSH, T4TOTAL, FREET4, T3FREE, THYROIDAB in the last 72 hours. Anemia Panel: No results for input(s): VITAMINB12, FOLATE, FERRITIN, TIBC, IRON, RETICCTPCT in the last 72 hours. Urine analysis:    Component Value Date/Time   COLORURINE YELLOW 07/08/2017 1150   APPEARANCEUR HAZY (A) 07/08/2017 1150   LABSPEC 1.040 (H) 07/08/2017 1150   PHURINE 5.0 07/08/2017 1150   GLUCOSEU NEGATIVE 07/08/2017 1150   HGBUR NEGATIVE 07/08/2017 1150   BILIRUBINUR SMALL (A) 07/08/2017 1150   KETONESUR 5 (A) 07/08/2017 1150   PROTEINUR NEGATIVE 07/08/2017 1150   UROBILINOGEN 0.2 12/09/2014 0844   NITRITE NEGATIVE 07/08/2017 1150   LEUKOCYTESUR NEGATIVE 07/08/2017 1150    Creatinine Clearance: CrCl cannot be calculated (Unknown ideal weight.).  Sepsis Labs: @LABRCNTIP (procalcitonin:4,lacticidven:4) )No results found for this or any previous visit (from the past 240 hour(s)).   Radiological Exams on Admission: Ct Abdomen Pelvis W Contrast  Result Date: 10/07/2017 CLINICAL DATA:  37 year old female with history of upper abdominal pain and nausea and vomiting since yesterday. EXAM: CT ABDOMEN AND PELVIS WITH CONTRAST TECHNIQUE: Multidetector CT imaging of the abdomen and pelvis was performed using the standard protocol following bolus administration of intravenous contrast. CONTRAST:  156mL OMNIPAQUE IOHEXOL 300 MG/ML  SOLN COMPARISON:  CT the abdomen and pelvis 07/08/2017. FINDINGS: Lower chest: Patchy airspace disease in the lung bases bilaterally (right lower lobe much greater than left lower lobe). Small hiatal hernia. Hepatobiliary: No suspicious cystic or solid hepatic lesions. No intra or extrahepatic biliary ductal dilatation. Status post cholecystectomy. Pancreas: No pancreatic mass. No pancreatic ductal dilatation. No pancreatic or peripancreatic fluid or inflammatory changes. Small calcification in  the uncinate process of the pancreas incidentally noted. Spleen: Unremarkable. Adrenals/Urinary Tract: Bilateral kidneys and bilateral  adrenal glands are normal in appearance. No hydroureteronephrosis. Urinary bladder is normal in appearance. Stomach/Bowel: Normal appearance of the stomach. No pathologic dilatation of small bowel or colon. Normal appendix. Vascular/Lymphatic: No significant atherosclerotic disease, aneurysm or dissection noted in the abdominal or pelvic vasculature. No lymphadenopathy noted in the abdomen or pelvis. Reproductive: Status post hysterectomy. Ovaries are unremarkable in appearance. Other: No significant volume of ascites.  No pneumoperitoneum. Musculoskeletal: There are no aggressive appearing lytic or blastic lesions noted in the visualized portions of the skeleton. IMPRESSION: 1. No acute findings are noted in the abdomen or pelvis to account for the patient's symptoms. 2. However, there is patchy multifocal airspace disease in the visualize lung bases, most severe in the right lower lobe. Clinical correlation for signs of developing infection or evidence of recent aspiration is recommended. 3. Normal appendix. Electronically Signed   By: Vinnie Langton M.D.   On: 10/07/2017 08:32    EKG: Independently reviewed.   Assessment/Plan   1) Nausea, Vomiting and diarrhea  Pt given IV fluids, phenergan for nausea,  NPO until decreased nausea and vomitting controlled.   2) Aspiration Pneumonia  IV Rocephin given in ED.  IV zithromax ordered.   Ct chest ordered to assess pneumonia and to evaluate further due to history of breast cancer.   3) Dehydration    Continue IV fluids,  Nurse may progress fluids and diet   4) Gastrointestinal hemorrhage most likely second to peptic ulcer due to aspirin usage.  Pt had a colonoscopy and upper endoscopy 06/27/2016.  She continues to take aspirin products even though this was thought to be the cause of previous ulcerative  disease.  Gi consultation deferred at this time as pt has a normal hemoglobin.  Hemoglobin will be repeated in am.  If hemoglobin remains normal and pt is able to tolerate po's, she should be able to be discharged with follow up with her primary care and her [place] doctor.   Pt counseled about avoidance of aspirin  Pt started on pecid IV .  DVT prophylaxis: SCD Code Status: Full Family Communication: no family present  Disposition Plan: Pt to observation  Consults called:  Admission status: Observation    Alyse Low PA-C [facility]  If 7PM-7AM, please contact night-coverage [email or web address] Password [email or web address]  10/07/2017, 11:30 AM

## 2017-10-07 NOTE — ED Notes (Signed)
Patient transported to CT 

## 2017-10-07 NOTE — ED Notes (Signed)
Pt oob to bathroom via wc with husband. No urine collected.

## 2017-10-08 DIAGNOSIS — R197 Diarrhea, unspecified: Secondary | ICD-10-CM | POA: Diagnosis not present

## 2017-10-08 DIAGNOSIS — K274 Chronic or unspecified peptic ulcer, site unspecified, with hemorrhage: Principal | ICD-10-CM

## 2017-10-08 DIAGNOSIS — Z853 Personal history of malignant neoplasm of breast: Secondary | ICD-10-CM | POA: Diagnosis not present

## 2017-10-08 DIAGNOSIS — K209 Esophagitis, unspecified: Secondary | ICD-10-CM

## 2017-10-08 DIAGNOSIS — T39395A Adverse effect of other nonsteroidal anti-inflammatory drugs [NSAID], initial encounter: Secondary | ICD-10-CM | POA: Diagnosis present

## 2017-10-08 DIAGNOSIS — T402X5A Adverse effect of other opioids, initial encounter: Secondary | ICD-10-CM | POA: Diagnosis present

## 2017-10-08 DIAGNOSIS — K259 Gastric ulcer, unspecified as acute or chronic, without hemorrhage or perforation: Secondary | ICD-10-CM | POA: Diagnosis not present

## 2017-10-08 DIAGNOSIS — J690 Pneumonitis due to inhalation of food and vomit: Secondary | ICD-10-CM | POA: Diagnosis present

## 2017-10-08 DIAGNOSIS — T39015A Adverse effect of aspirin, initial encounter: Secondary | ICD-10-CM | POA: Diagnosis present

## 2017-10-08 DIAGNOSIS — F17210 Nicotine dependence, cigarettes, uncomplicated: Secondary | ICD-10-CM | POA: Diagnosis present

## 2017-10-08 DIAGNOSIS — K3189 Other diseases of stomach and duodenum: Secondary | ICD-10-CM | POA: Diagnosis present

## 2017-10-08 DIAGNOSIS — Z803 Family history of malignant neoplasm of breast: Secondary | ICD-10-CM | POA: Diagnosis not present

## 2017-10-08 DIAGNOSIS — Z7951 Long term (current) use of inhaled steroids: Secondary | ICD-10-CM | POA: Diagnosis not present

## 2017-10-08 DIAGNOSIS — Z90710 Acquired absence of both cervix and uterus: Secondary | ICD-10-CM | POA: Diagnosis not present

## 2017-10-08 DIAGNOSIS — Z9049 Acquired absence of other specified parts of digestive tract: Secondary | ICD-10-CM | POA: Diagnosis not present

## 2017-10-08 DIAGNOSIS — G8929 Other chronic pain: Secondary | ICD-10-CM | POA: Diagnosis present

## 2017-10-08 DIAGNOSIS — Z8673 Personal history of transient ischemic attack (TIA), and cerebral infarction without residual deficits: Secondary | ICD-10-CM | POA: Diagnosis not present

## 2017-10-08 DIAGNOSIS — I10 Essential (primary) hypertension: Secondary | ICD-10-CM | POA: Diagnosis present

## 2017-10-08 DIAGNOSIS — E860 Dehydration: Secondary | ICD-10-CM | POA: Diagnosis present

## 2017-10-08 DIAGNOSIS — D62 Acute posthemorrhagic anemia: Secondary | ICD-10-CM | POA: Diagnosis present

## 2017-10-08 DIAGNOSIS — K2971 Gastritis, unspecified, with bleeding: Secondary | ICD-10-CM | POA: Diagnosis not present

## 2017-10-08 DIAGNOSIS — L500 Allergic urticaria: Secondary | ICD-10-CM | POA: Diagnosis present

## 2017-10-08 DIAGNOSIS — R112 Nausea with vomiting, unspecified: Secondary | ICD-10-CM | POA: Diagnosis not present

## 2017-10-08 DIAGNOSIS — K644 Residual hemorrhoidal skin tags: Secondary | ICD-10-CM | POA: Diagnosis present

## 2017-10-08 DIAGNOSIS — Z8542 Personal history of malignant neoplasm of other parts of uterus: Secondary | ICD-10-CM | POA: Diagnosis not present

## 2017-10-08 DIAGNOSIS — T426X5A Adverse effect of other antiepileptic and sedative-hypnotic drugs, initial encounter: Secondary | ICD-10-CM | POA: Diagnosis present

## 2017-10-08 DIAGNOSIS — R109 Unspecified abdominal pain: Secondary | ICD-10-CM | POA: Diagnosis present

## 2017-10-08 DIAGNOSIS — Z800 Family history of malignant neoplasm of digestive organs: Secondary | ICD-10-CM | POA: Diagnosis not present

## 2017-10-08 DIAGNOSIS — E876 Hypokalemia: Secondary | ICD-10-CM | POA: Diagnosis present

## 2017-10-08 LAB — HIV ANTIBODY (ROUTINE TESTING W REFLEX): HIV Screen 4th Generation wRfx: NONREACTIVE

## 2017-10-08 LAB — CBC
HCT: 33.4 % — ABNORMAL LOW (ref 36.0–46.0)
Hemoglobin: 10.3 g/dL — ABNORMAL LOW (ref 12.0–15.0)
MCH: 30.5 pg (ref 26.0–34.0)
MCHC: 30.8 g/dL (ref 30.0–36.0)
MCV: 98.8 fL (ref 78.0–100.0)
PLATELETS: 298 10*3/uL (ref 150–400)
RBC: 3.38 MIL/uL — ABNORMAL LOW (ref 3.87–5.11)
RDW: 14.5 % (ref 11.5–15.5)
WBC: 7.2 10*3/uL (ref 4.0–10.5)

## 2017-10-08 LAB — MAGNESIUM: MAGNESIUM: 1.8 mg/dL (ref 1.7–2.4)

## 2017-10-08 LAB — BASIC METABOLIC PANEL
Anion gap: 9 (ref 5–15)
BUN: 7 mg/dL (ref 6–20)
CO2: 23 mmol/L (ref 22–32)
CREATININE: 0.78 mg/dL (ref 0.44–1.00)
Calcium: 7.9 mg/dL — ABNORMAL LOW (ref 8.9–10.3)
Chloride: 109 mmol/L (ref 101–111)
GFR calc Af Amer: >60 mL/min (ref >60)
Glucose, Bld: 74 mg/dL (ref 65–99)
Potassium: 3.1 mmol/L — ABNORMAL LOW (ref 3.5–5.1)
Sodium: 141 mmol/L (ref 135–145)

## 2017-10-08 MED ORDER — HYDROCHLOROTHIAZIDE 12.5 MG PO CAPS
12.5000 mg | ORAL_CAPSULE | Freq: Every day | ORAL | Status: DC
Start: 2017-10-09 — End: 2017-10-08

## 2017-10-08 MED ORDER — PANTOPRAZOLE SODIUM 40 MG PO TBEC
40.0000 mg | DELAYED_RELEASE_TABLET | Freq: Two times a day (BID) | ORAL | Status: DC
  Administered 2017-10-08 – 2017-10-09 (×3): 40 mg via ORAL
  Filled 2017-10-08 (×3): qty 1

## 2017-10-08 MED ORDER — METHADONE HCL 10 MG PO TABS
140.0000 mg | ORAL_TABLET | Freq: Every day | ORAL | Status: DC
  Administered 2017-10-08 – 2017-10-09 (×2): 140 mg via ORAL
  Filled 2017-10-08 (×2): qty 14

## 2017-10-08 MED ORDER — AMOXICILLIN-POT CLAVULANATE 875-125 MG PO TABS
1.0000 | ORAL_TABLET | Freq: Two times a day (BID) | ORAL | Status: DC
  Administered 2017-10-08 – 2017-10-09 (×3): 1 via ORAL
  Filled 2017-10-08 (×3): qty 1

## 2017-10-08 MED ORDER — LISINOPRIL 20 MG PO TABS
20.0000 mg | ORAL_TABLET | Freq: Once | ORAL | Status: AC
  Administered 2017-10-08: 20 mg via ORAL

## 2017-10-08 MED ORDER — NICOTINE 7 MG/24HR TD PT24
7.0000 mg | MEDICATED_PATCH | Freq: Every day | TRANSDERMAL | Status: DC
  Administered 2017-10-08 – 2017-10-09 (×2): 7 mg via TRANSDERMAL
  Filled 2017-10-08 (×2): qty 1

## 2017-10-08 MED ORDER — LISINOPRIL 20 MG PO TABS
20.0000 mg | ORAL_TABLET | Freq: Every day | ORAL | Status: DC
  Administered 2017-10-09: 20 mg via ORAL
  Filled 2017-10-08: qty 1
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
## 2017-10-08 NOTE — Progress Notes (Signed)
PROGRESS NOTE    Felicia Moore  KKX:[id number] DOB: [birth year] DOA: 10/07/2017 PCP: Javier Docker, MD   Outpatient Specialists:     Brief Narrative:  Felicia Moore is a 37 y.o. female with medical history significant of gastritis, ulcer and diverticulitis who presents today with abdominal pain, vomiting and diarrhea since last pm.  Pt reports seeing blood in vomit.  Pt reports she has not been able to drink anything.  She vomits when she tries.  Pt reports she feels like she has in the pst when she has had stomach ulcers.  Pt also  has a history of breast cancer.  She had a left lumpectomy in 2006.  Pt reports normal mammograms since. Pt has a history of headaches.  Pt reports she took BC powders last week.  Pt reports she took one yesterday. Pt has had a history of bleeding with taking similar products in the past.   Pt tried phenergan at home with no relief.       Assessment & Plan:   Active Problems:   Vomiting   GI bleeding    Nausea, Vomiting  With Gastrointestinal hemorrhage most likely second to peptic ulcer due to aspirin usage. -IVF -clears -Pt had a colonoscopy and upper endoscopy 06/27/2016.  She continues to take aspirin products even though this was thought to be the cause of previous ulcerative disease. -PPI -GI consult: EGD in AM  Aspiration Pneumonia -start augmentin  Dehydration   -IVF  Hypokalemia -replete  Chronic pain -on methadone    DVT prophylaxis:  SCD's  Code Status: Full Code   Family Communication:   Disposition Plan:     Consultants:   GI    Subjective: Bright red blood with stools  Objective: Vitals:   10/07/17 1315 10/07/17 1418 10/07/17 1633 10/08/17 0823  BP: 124/83 (!) 142/118 132/77 (!) 146/87  Pulse: 91 88 78   Resp: 18 (!) 22 (!) 24   Temp:   98 F (36.7 C)   TempSrc:   Oral   SpO2: 98% 98% 100%   Weight:   78.6 kg (173 lb 4.8 oz)   Height:   5\' 4"  (1.626 m)     Intake/Output Summary  (Last 24 hours) at 10/08/2017 1333 Last data filed at 10/08/2017 1029 Gross per 24 hour  Intake 736.67 ml  Output -  Net 736.67 ml   Filed Weights   10/07/17 1633  Weight: 78.6 kg (173 lb 4.8 oz)    Examination:  General exam: Appears calm and comfortable  Respiratory system: Clear to auscultation. Respiratory effort normal. Cardiovascular system: S1 & S2 heard, RRR. No JVD, murmurs, rubs, gallops or clicks. No pedal edema. Gastrointestinal system: Abdomen is nondistended, soft and nontender. No organomegaly or masses felt. Normal bowel sounds heard. Central nervous system: Alert and oriented. No focal neurological deficits. Extremities: Symmetric 5 x 5 power. Skin: No rashes, lesions or ulcers Psychiatry: Judgement and insight appear normal. Mood & affect appropriate.     Data Reviewed: I have personally reviewed following labs and imaging studies  CBC: Recent Labs  Lab 10/07/17 0614 10/08/17 0350  WBC 15.6* 7.2  HGB 14.1 10.3*  HCT 44.5 33.4*  MCV 98.7 98.8  PLT 370 169   Basic Metabolic Panel: Recent Labs  Lab 10/07/17 0614 10/08/17 0350  NA 137 141  K 4.7 3.1*  CL 101 109  CO2 21* 23  GLUCOSE 177* 74  BUN 15 7  CREATININE 0.96 0.78  CALCIUM 9.4 7.9*  GFR: Estimated Creatinine Clearance: 96.8 mL/min (by C-G formula based on SCr of 0.78 mg/dL). Liver Function Tests: Recent Labs  Lab 10/07/17 0614  AST 21  ALT 16  ALKPHOS 93  BILITOT 0.4  PROT 7.1  ALBUMIN 4.1   Recent Labs  Lab 10/07/17 0614  LIPASE 31   No results for input(s): AMMONIA in the last 168 hours. Coagulation Profile: No results for input(s): INR, PROTIME in the last 168 hours. Cardiac Enzymes: No results for input(s): CKTOTAL, CKMB, CKMBINDEX, TROPONINI in the last 168 hours. BNP (last 3 results) No results for input(s): PROBNP in the last 8760 hours. HbA1C: No results for input(s): HGBA1C in the last 72 hours. CBG: No results for input(s): GLUCAP in the last 168  hours. Lipid Profile: No results for input(s): CHOL, HDL, LDLCALC, TRIG, CHOLHDL, LDLDIRECT in the last 72 hours. Thyroid Function Tests: No results for input(s): TSH, T4TOTAL, FREET4, T3FREE, THYROIDAB in the last 72 hours. Anemia Panel: No results for input(s): VITAMINB12, FOLATE, FERRITIN, TIBC, IRON, RETICCTPCT in the last 72 hours. Urine analysis:    Component Value Date/Time   COLORURINE YELLOW 10/07/2017 1324   APPEARANCEUR HAZY (A) 10/07/2017 1324   LABSPEC 1.031 (H) 10/07/2017 1324   PHURINE 6.0 10/07/2017 1324   GLUCOSEU NEGATIVE 10/07/2017 1324   HGBUR SMALL (A) 10/07/2017 1324   BILIRUBINUR NEGATIVE 10/07/2017 1324   KETONESUR NEGATIVE 10/07/2017 1324   PROTEINUR NEGATIVE 10/07/2017 1324   UROBILINOGEN 0.2 12/09/2014 0844   NITRITE NEGATIVE 10/07/2017 1324   LEUKOCYTESUR NEGATIVE 10/07/2017 1324     )No results found for this or any previous visit (from the past 240 hour(s)).    Anti-infectives (From admission, onward)   Start     Dose/Rate Route Frequency Ordered Stop   10/08/17 1000  amoxicillin-clavulanate (AUGMENTIN) 875-125 MG per tablet 1 tablet     1 tablet Oral Every 12 hours 10/08/17 0937     10/07/17 1245  azithromycin (ZITHROMAX) 500 mg in sodium chloride 0.9 % 250 mL IVPB     500 mg 250 mL/hr over 60 Minutes Intravenous  Once 10/07/17 1230 10/07/17 1531   10/07/17 0915  cefTRIAXone (ROCEPHIN) 1 g in sodium chloride 0.9 % 100 mL IVPB     1 g 200 mL/hr over 30 Minutes Intravenous  Once 10/07/17 0904 10/07/17 1215   10/07/17 0915  doxycycline (VIBRA-TABS) tablet 100 mg     100 mg Oral  Once 10/07/17 3710         Radiology Studies: Ct Chest Wo Contrast  Result Date: 10/07/2017 CLINICAL DATA:  Bibasilar lung opacities on CT abdomen study from earlier today, which was performed for upper abdominal pain. History of breast cancer. EXAM: CT CHEST WITHOUT CONTRAST TECHNIQUE: Multidetector CT imaging of the chest was performed following the standard protocol  without IV contrast. COMPARISON:  10/07/2017 CT abdomen/pelvis. 03/08/2017 chest radiograph. FINDINGS: Cardiovascular: Normal heart size. No significant pericardial fluid/thickening. Left anterior descending and right coronary atherosclerosis. Great vessels are normal in course and caliber. Mediastinum/Nodes: No discrete thyroid nodules. Unremarkable esophagus. No pathologically enlarged axillary, mediastinal or gross hilar lymph nodes, noting limited sensitivity for the detection of hilar adenopathy on this noncontrast study. Lungs/Pleura: No pneumothorax. No pleural effusion. There is mild patchy peribronchovascular consolidation and ground-glass opacity in both lungs, predominantly involving the right lower lobe, with additional involvement of the right upper lobe, right middle lobe and left lower lobe. No lung masses or significant pulmonary nodules. No significant bronchiectasis. Upper abdomen: Small hiatal hernia. Musculoskeletal:  No aggressive appearing focal osseous lesions. IMPRESSION: 1. Nonspecific mild patchy peribronchovascular consolidation and ground-glass opacity in both lungs, most prominent in the right lower lobe, most suggestive of a mild nonspecific infectious or inflammatory multilobar pneumonia. Any need for chest imaging follow-up should be based on clinical assessment. 2. Two vessel coronary atherosclerosis. 3. Small hiatal hernia. Electronically Signed   By: Ilona Sorrel M.D.   On: 10/07/2017 13:15   Ct Abdomen Pelvis W Contrast  Result Date: 10/07/2017 CLINICAL DATA:  37 year old female with history of upper abdominal pain and nausea and vomiting since yesterday. EXAM: CT ABDOMEN AND PELVIS WITH CONTRAST TECHNIQUE: Multidetector CT imaging of the abdomen and pelvis was performed using the standard protocol following bolus administration of intravenous contrast. CONTRAST:  135mL OMNIPAQUE IOHEXOL 300 MG/ML  SOLN COMPARISON:  CT the abdomen and pelvis 07/08/2017. FINDINGS: Lower chest:  Patchy airspace disease in the lung bases bilaterally (right lower lobe much greater than left lower lobe). Small hiatal hernia. Hepatobiliary: No suspicious cystic or solid hepatic lesions. No intra or extrahepatic biliary ductal dilatation. Status post cholecystectomy. Pancreas: No pancreatic mass. No pancreatic ductal dilatation. No pancreatic or peripancreatic fluid or inflammatory changes. Small calcification in the uncinate process of the pancreas incidentally noted. Spleen: Unremarkable. Adrenals/Urinary Tract: Bilateral kidneys and bilateral adrenal glands are normal in appearance. No hydroureteronephrosis. Urinary bladder is normal in appearance. Stomach/Bowel: Normal appearance of the stomach. No pathologic dilatation of small bowel or colon. Normal appendix. Vascular/Lymphatic: No significant atherosclerotic disease, aneurysm or dissection noted in the abdominal or pelvic vasculature. No lymphadenopathy noted in the abdomen or pelvis. Reproductive: Status post hysterectomy. Ovaries are unremarkable in appearance. Other: No significant volume of ascites.  No pneumoperitoneum. Musculoskeletal: There are no aggressive appearing lytic or blastic lesions noted in the visualized portions of the skeleton. IMPRESSION: 1. No acute findings are noted in the abdomen or pelvis to account for the patient's symptoms. 2. However, there is patchy multifocal airspace disease in the visualize lung bases, most severe in the right lower lobe. Clinical correlation for signs of developing infection or evidence of recent aspiration is recommended. 3. Normal appendix. Electronically Signed   By: Vinnie Langton M.D.   On: 10/07/2017 08:32        Scheduled Meds: . amoxicillin-clavulanate  1 tablet Oral Q12H  . doxycycline  100 mg Oral Once  . gabapentin  600 mg Oral TID  . [START ON 10/09/2017] lisinopril  20 mg Oral Daily   And  . [START ON 10/09/2017] hydrochlorothiazide  12.5 mg Oral Daily  . ipratropium  1 spray  Each Nare Daily  . methadone  140 mg Oral Daily  . nicotine  7 mg Transdermal Daily  . pantoprazole  40 mg Oral BID  . sucralfate  1 g Oral TID WC & HS   Continuous Infusions: . sodium chloride 125 mL/hr at 10/08/17 1154     LOS: 0 days    Time spent: 25 min    Geradine Girt, DO [facility] Pager [phone]  If 7PM-7AM, please contact night-coverage [email or web address] Password [email or web address] 10/08/2017, 1:33 PM
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
## 2017-10-09 ENCOUNTER — Encounter (HOSPITAL_COMMUNITY)
Admission: EM | Disposition: A | Discharge status: Home / Self Care | Payer: Self-pay | Source: Home / Self Care | Attending: Internal Medicine

## 2017-10-09 ENCOUNTER — Encounter (HOSPITAL_COMMUNITY): Payer: Self-pay

## 2017-10-09 ENCOUNTER — Inpatient Hospital Stay (HOSPITAL_COMMUNITY): Payer: Medicaid Other | Admitting: Certified Registered Nurse Anesthetist

## 2017-10-09 DIAGNOSIS — R112 Nausea with vomiting, unspecified: Secondary | ICD-10-CM

## 2017-10-09 DIAGNOSIS — R197 Diarrhea, unspecified: Secondary | ICD-10-CM

## 2017-10-09 HISTORY — PX: ESOPHAGOGASTRODUODENOSCOPY (EGD) WITH PROPOFOL: SHX5813

## 2017-10-09 LAB — CBC
HEMATOCRIT: 36.8 % (ref 36.0–46.0)
HEMOGLOBIN: 11.2 g/dL — AB (ref 12.0–15.0)
MCH: 30.2 pg (ref 26.0–34.0)
MCHC: 30.4 g/dL (ref 30.0–36.0)
MCV: 99.2 fL (ref 78.0–100.0)
Platelets: 279 10*3/uL (ref 150–400)
RBC: 3.71 MIL/uL — AB (ref 3.87–5.11)
RDW: 13.9 % (ref 11.5–15.5)
WBC: 6 10*3/uL (ref 4.0–10.5)

## 2017-10-09 LAB — BASIC METABOLIC PANEL
Anion gap: 7 (ref 5–15)
BUN: 5 mg/dL — AB (ref 6–20)
CHLORIDE: 110 mmol/L (ref 101–111)
CO2: 27 mmol/L (ref 22–32)
Calcium: 8.5 mg/dL — ABNORMAL LOW (ref 8.9–10.3)
Creatinine, Ser: 0.8 mg/dL (ref 0.44–1.00)
GFR calc Af Amer: >60 mL/min (ref >60)
GFR calc non Af Amer: >60 mL/min (ref >60)
GLUCOSE: 88 mg/dL (ref 65–99)
POTASSIUM: 3.6 mmol/L (ref 3.5–5.1)
Sodium: 144 mmol/L (ref 135–145)

## 2017-10-09 SURGERY — ESOPHAGOGASTRODUODENOSCOPY (EGD) WITH PROPOFOL
Anesthesia: Monitor Anesthesia Care

## 2017-10-09 MED ORDER — LIDOCAINE 2% (20 MG/ML) 5 ML SYRINGE
INTRAMUSCULAR | Status: DC | PRN
  Administered 2017-10-09: 100 mg via INTRAVENOUS

## 2017-10-09 MED ORDER — LISINOPRIL 20 MG PO TABS: 20.0000 mg | ORAL_TABLET | Freq: Every day | ORAL | 0 refills | Status: DC

## 2017-10-09 MED ORDER — LACTATED RINGERS IV SOLN
INTRAVENOUS | Status: AC | PRN
  Administered 2017-10-09: 1000 mL via INTRAVENOUS

## 2017-10-09 MED ORDER — PROPOFOL 500 MG/50ML IV EMUL
INTRAVENOUS | Status: DC | PRN
  Administered 2017-10-09: 100 ug/kg/min via INTRAVENOUS

## 2017-10-09 MED ORDER — AMOXICILLIN-POT CLAVULANATE 875-125 MG PO TABS: 1.0000 | ORAL_TABLET | Freq: Two times a day (BID) | ORAL | 0 refills | Status: DC

## 2017-10-09 MED ORDER — PROPOFOL 10 MG/ML IV BOLUS
INTRAVENOUS | Status: DC | PRN
  Administered 2017-10-09 (×3): 40 mg via INTRAVENOUS

## 2017-10-09 MED ORDER — NICOTINE 7 MG/24HR TD PT24: 7.0000 mg | MEDICATED_PATCH | Freq: Every day | TRANSDERMAL | 0 refills | Status: AC

## 2017-10-09 SURGICAL SUPPLY — 15 items

## 2017-10-09 NOTE — Discharge Summary (Addendum)
Physician Discharge Summary  Felicia Moore WEX:[id number] DOB: 09/23/1980 DOA: 10/07/2017  PCP: Javier Docker, MD  Admit date: 10/07/2017 Discharge date: 10/09/2017   Recommendations for Outpatient Follow-Up:   1. PPI BID 2. EGD to ensure healing of ulcer   Discharge Diagnosis:   Active Problems:   Vomiting   GI bleeding   Discharge disposition:  Home  Discharge Condition: Improved.  Diet recommendation: Low sodium, heart healthy.    Wound care: None.   History of Present Illness:   37 year old female past medical history significant for gastritis gastric ulcer and diverticulitis presents today with abdominal pain vomiting and diarrhea.  She has seen a little bit of blood in her vomit.  She was told in the past to stop using Goody powder so she has started to use BC powders.  Her extensive list of medications that she probably should not use including but not limited to Alka-Seltzer, naproxen, Aleve, Motrin, ibuprofen, and aspirin.  As well as migraine preparations.  Patient be admitted into the hospital started on proton pump inhibitor hopefully nausea vomiting and diarrhea will improve if a 30-day treatment of note proton pump inhibitor does not improve her situation then she may require repeat EGD.   Hospital Course by Problem:   Nausea, Vomiting  With Gastrointestinal hemorrhage most likely second to peptic ulcer due to aspirin usage. -s/p EGD:                             - Non-obstructing non-bleeding gastric ulcer with                            no stigmata of bleeding. NSAID induced etiology.                            There is no evidence of perforation. Biopsied.                           - Erythematous duodenopathy.                           - Normal second portion of the duodenum. Recommendation:     - Await pathology results.                           - Repeat upper endoscopy in 3 months to check                            healing.        - Use Prilosec (omeprazole) 40 mg PO BID for 3                            months followed by Omeprazole 40mg  daily                           - No aspirin, ibuprofen, naproxen, or other    Aspiration Pneumonia -augmentin  Dehydration  -resolved with IVF  ABLA -stable Hgb -suspect some volume dilution with IVF  Hypokalemia -repleted  Chronic pain -on methadone      Medical Consultants:    GI   Discharge Exam:  Vitals:   10/09/17 1035 10/09/17 1040  BP:  (!) 149/90  Pulse: (!) 52 (!) 47  Resp: 20 (!) 21  Temp:    SpO2: 99% 100%   Vitals:   10/09/17 1029 10/09/17 1030 10/09/17 1035 10/09/17 1040  BP: (!) 167/80   (!) 149/90  Pulse:  (!) 57 (!) 52 (!) 47  Resp:  19 20 (!) 21  Temp:      TempSrc:      SpO2:  99% 99% 100%  Weight:      Height:        Gen:  NAD    The results of significant diagnostics from this hospitalization (including imaging, microbiology, ancillary and laboratory) are listed below for reference.     Procedures and Diagnostic Studies:   Ct Chest Wo Contrast  Result Date: 10/07/2017 CLINICAL DATA:  Bibasilar lung opacities on CT abdomen study from earlier today, which was performed for upper abdominal pain. History of breast cancer. EXAM: CT CHEST WITHOUT CONTRAST TECHNIQUE: Multidetector CT imaging of the chest was performed following the standard protocol without IV contrast. COMPARISON:  10/07/2017 CT abdomen/pelvis. 03/08/2017 chest radiograph. FINDINGS: Cardiovascular: Normal heart size. No significant pericardial fluid/thickening. Left anterior descending and right coronary atherosclerosis. Great vessels are normal in course and caliber. Mediastinum/Nodes: No discrete thyroid nodules. Unremarkable esophagus. No pathologically enlarged axillary, mediastinal or gross hilar lymph nodes, noting limited sensitivity for the detection of hilar adenopathy on this noncontrast study. Lungs/Pleura: No pneumothorax. No pleural  effusion. There is mild patchy peribronchovascular consolidation and ground-glass opacity in both lungs, predominantly involving the right lower lobe, with additional involvement of the right upper lobe, right middle lobe and left lower lobe. No lung masses or significant pulmonary nodules. No significant bronchiectasis. Upper abdomen: Small hiatal hernia. Musculoskeletal:  No aggressive appearing focal osseous lesions. IMPRESSION: 1. Nonspecific mild patchy peribronchovascular consolidation and ground-glass opacity in both lungs, most prominent in the right lower lobe, most suggestive of a mild nonspecific infectious or inflammatory multilobar pneumonia. Any need for chest imaging follow-up should be based on clinical assessment. 2. Two vessel coronary atherosclerosis. 3. Small hiatal hernia. Electronically Signed   By: Ilona Sorrel M.D.   On: 10/07/2017 13:15   Ct Abdomen Pelvis W Contrast  Result Date: 10/07/2017 CLINICAL DATA:  37 year old female with history of upper abdominal pain and nausea and vomiting since yesterday. EXAM: CT ABDOMEN AND PELVIS WITH CONTRAST TECHNIQUE: Multidetector CT imaging of the abdomen and pelvis was performed using the standard protocol following bolus administration of intravenous contrast. CONTRAST:  181mL OMNIPAQUE IOHEXOL 300 MG/ML  SOLN COMPARISON:  CT the abdomen and pelvis 07/08/2017. FINDINGS: Lower chest: Patchy airspace disease in the lung bases bilaterally (right lower lobe much greater than left lower lobe). Small hiatal hernia. Hepatobiliary: No suspicious cystic or solid hepatic lesions. No intra or extrahepatic biliary ductal dilatation. Status post cholecystectomy. Pancreas: No pancreatic mass. No pancreatic ductal dilatation. No pancreatic or peripancreatic fluid or inflammatory changes. Small calcification in the uncinate process of the pancreas incidentally noted. Spleen: Unremarkable. Adrenals/Urinary Tract: Bilateral kidneys and bilateral adrenal glands are  normal in appearance. No hydroureteronephrosis. Urinary bladder is normal in appearance. Stomach/Bowel: Normal appearance of the stomach. No pathologic dilatation of small bowel or colon. Normal appendix. Vascular/Lymphatic: No significant atherosclerotic disease, aneurysm or dissection noted in the abdominal or pelvic vasculature. No lymphadenopathy noted in the abdomen or pelvis. Reproductive: Status post hysterectomy. Ovaries are unremarkable in appearance. Other: No significant volume of ascites.  No  pneumoperitoneum. Musculoskeletal: There are no aggressive appearing lytic or blastic lesions noted in the visualized portions of the skeleton. IMPRESSION: 1. No acute findings are noted in the abdomen or pelvis to account for the patient's symptoms. 2. However, there is patchy multifocal airspace disease in the visualize lung bases, most severe in the right lower lobe. Clinical correlation for signs of developing infection or evidence of recent aspiration is recommended. 3. Normal appendix. Electronically Signed   By: Vinnie Langton M.D.   On: 10/07/2017 08:32     Labs:   Basic Metabolic Panel: Recent Labs  Lab 10/07/17 0614 10/08/17 0350 10/09/17 0705  NA 137 141 144  K 4.7 3.1* 3.6  CL 101 109 110  CO2 21* 23 27  GLUCOSE 177* 74 88  BUN 15 7 5*  CREATININE 0.96 0.78 0.80  CALCIUM 9.4 7.9* 8.5*  MG  --  1.8  --    GFR Estimated Creatinine Clearance: 96.6 mL/min (by C-G formula based on SCr of 0.8 mg/dL). Liver Function Tests: Recent Labs  Lab 10/07/17 0614  AST 21  ALT 16  ALKPHOS 93  BILITOT 0.4  PROT 7.1  ALBUMIN 4.1   Recent Labs  Lab 10/07/17 0614  LIPASE 31   No results for input(s): AMMONIA in the last 168 hours. Coagulation profile No results for input(s): INR, PROTIME in the last 168 hours.  CBC: Recent Labs  Lab 10/07/17 0614 10/08/17 0350 10/09/17 0705  WBC 15.6* 7.2 6.0  HGB 14.1 10.3* 11.2*  HCT 44.5 33.4* 36.8  MCV 98.7 98.8 99.2  PLT 370 298 279    Cardiac Enzymes: No results for input(s): CKTOTAL, CKMB, CKMBINDEX, TROPONINI in the last 168 hours. BNP: Invalid input(s): POCBNP CBG: No results for input(s): GLUCAP in the last 168 hours. D-Dimer No results for input(s): DDIMER in the last 72 hours. Hgb A1c No results for input(s): HGBA1C in the last 72 hours. Lipid Profile No results for input(s): CHOL, HDL, LDLCALC, TRIG, CHOLHDL, LDLDIRECT in the last 72 hours. Thyroid function studies No results for input(s): TSH, T4TOTAL, T3FREE, THYROIDAB in the last 72 hours.  Invalid input(s): FREET3 Anemia work up No results for input(s): VITAMINB12, FOLATE, FERRITIN, TIBC, IRON, RETICCTPCT in the last 72 hours. Microbiology No results found for this or any previous visit (from the past 240 hour(s)).   Discharge Instructions:   Discharge Instructions    Diet - low sodium heart healthy   Complete by:  As directed    Discharge instructions   Complete by:  As directed    Repeat upper endoscopy in 3 months to check healing. - Use Prilosec (omeprazole) 40 mg PO BID for 3 months followed by Omeprazole 40mg  daily - No aspirin, ibuprofen, naproxen, or other non-steroidal anti-inflammatory drugs.   Increase activity slowly   Complete by:  As directed      Allergies as of 10/09/2017      Reactions   Reglan [metoclopramide] Anaphylaxis, Hives, Swelling   Throat swelling   Toradol [ketorolac Tromethamine] Anaphylaxis, Hives, Swelling   Throat swelling   Zofran [ondansetron Hcl] Anaphylaxis, Hives, Swelling   Throat swelling       Medication List    STOP taking these medications   lisinopril-hydrochlorothiazide 20-12.5 MG tablet Commonly known as:  PRINZIDE,ZESTORETIC     TAKE these medications   amoxicillin-clavulanate 875-125 MG tablet Commonly known as:  AUGMENTIN Take 1 tablet by mouth every 12 (twelve) hours.   fluticasone 50 MCG/ACT nasal spray Commonly known as:  FLONASE Place 1 spray into both nostrils 2 (two)  times daily.   gabapentin 300 MG capsule Commonly known as:  NEURONTIN Take 2 capsules (600 mg total) by mouth 3 (three) times daily.   ipratropium 0.03 % nasal spray Commonly known as:  ATROVENT Place 1 spray into both nostrils daily.   lisinopril 20 MG tablet Commonly known as:  PRINIVIL,ZESTRIL Take 1 tablet (20 mg total) by mouth daily. Start taking on:  10/10/2017   montelukast 10 MG tablet Commonly known as:  SINGULAIR Take 10 mg by mouth daily.   nicotine 7 mg/24hr patch Commonly known as:  NICODERM CQ - dosed in mg/24 hr Place 1 patch (7 mg total) onto the skin daily. Start taking on:  10/10/2017   omeprazole 40 MG capsule Commonly known as:  PRILOSEC Take 1 capsule (40 mg total) by mouth 2 (two) times daily.   PROAIR HFA 108 (90 Base) MCG/ACT inhaler Generic drug:  albuterol Inhale 2 puffs into the lungs every 4 (four) hours as needed for wheezing or shortness of breath.   promethazine 25 MG tablet Commonly known as:  PHENERGAN Take 1 tablet (25 mg total) by mouth every 6 (six) hours as needed for nausea.   sucralfate 1 g tablet Commonly known as:  CARAFATE Take 1 tablet (1 g total) by mouth 4 (four) times daily -  with meals and at bedtime.   traZODone 50 MG tablet Commonly known as:  DESYREL Take 50 mg by mouth at bedtime as needed for sleep.      Follow-up Information    Pavelock, Ralene Bathe, MD Follow up in 1 week(s).   Specialty:  Internal Medicine Contact information: [address] [place] [address] [phone]        Mauri Pole, MD Follow up.   Specialty:  Gastroenterology Contact information: [place] [address] [phone]            Time coordinating discharge: 35 min  Signed:  Geradine Girt   [facility] 10/09/2017, 1:53 PM

## 2017-10-09 NOTE — Anesthesia Preprocedure Evaluation (Signed)
Anesthesia Evaluation  Patient identified by MRN, date of birth, ID band Patient awake    Reviewed: Allergy & Precautions, NPO status , Patient's Chart, lab work & pertinent test results  Airway Mallampati: I  TM Distance: >3 FB Neck ROM: Full    Dental   Pulmonary Current Smoker,    Pulmonary exam normal        Cardiovascular hypertension, Pt. on medications Normal cardiovascular exam     Neuro/Psych CVA    GI/Hepatic   Endo/Other    Renal/GU      Musculoskeletal   Abdominal   Peds  Hematology   Anesthesia Other Findings   Reproductive/Obstetrics                             Anesthesia Physical Anesthesia Plan  ASA: II  Anesthesia Plan: MAC   Post-op Pain Management:    Induction: Intravenous  PONV Risk Score and Plan: 1 and Treatment may vary due to age or medical condition  Airway Management Planned: Simple Face Mask  Additional Equipment:   Intra-op Plan:   Post-operative Plan:   Informed Consent: I have reviewed the patients History and Physical, chart, labs and discussed the procedure including the risks, benefits and alternatives for the proposed anesthesia with the patient or authorized representative who has indicated his/her understanding and acceptance.     Plan Discussed with: CRNA and Surgeon  Anesthesia Plan Comments:         Anesthesia Quick Evaluation

## 2017-10-09 NOTE — Progress Notes (Signed)
Pt given discharge instructions, prescriptions, and care notes. Pt verbalized understanding AEB no further questions or concerns at this time. IV was discontinued, no redness, pain, or swelling noted at this time. Telemetry discontinued and Centralized Telemetry was notified. Pt left the floor via wheelchair with staff in stable condition. 

## 2017-10-09 NOTE — Transfer of Care (Signed)
Immediate Anesthesia Transfer of Care Note  Patient: Felicia Moore  Procedure(s) Performed: ESOPHAGOGASTRODUODENOSCOPY (EGD) WITH PROPOFOL (N/A )  Patient Location: Endoscopy Unit  Anesthesia Type:MAC  Level of Consciousness: awake, alert  and oriented  Airway & Oxygen Therapy: Patient Spontanous Breathing  Post-op Assessment: Report given to RN and Post -op Vital signs reviewed and stable  Post vital signs: Reviewed and stable  Last Vitals:  Vitals Value Taken Time  BP    Temp    Pulse    Resp    SpO2      Last Pain:  Vitals:   10/09/17 0919  TempSrc: Oral  PainSc: 2       Patients Stated Pain Goal: 2 (75/10/25 8527)  Complications: No apparent anesthesia complications

## 2017-10-09 NOTE — Anesthesia Postprocedure Evaluation (Signed)
Anesthesia Post Note  Patient: [name]  Procedure(s) Performed: ESOPHAGOGASTRODUODENOSCOPY (EGD) WITH PROPOFOL (N/A )     Patient location during evaluation: PACU Anesthesia Type: MAC Level of consciousness: awake and alert Pain management: pain level controlled Vital Signs Assessment: post-procedure vital signs reviewed and stable Respiratory status: spontaneous breathing, nonlabored ventilation, respiratory function stable and patient connected to nasal cannula oxygen Cardiovascular status: stable and blood pressure returned to baseline Postop Assessment: no apparent nausea or vomiting Anesthetic complications: no    Last Vitals:  Vitals:   10/09/17 1035 10/09/17 1040  BP:  (!) 149/90  Pulse: (!) 52 (!) 47  Resp: 20 (!) 21  Temp:    SpO2: 99% 100%    Last Pain:  Vitals:   10/09/17 1030  TempSrc:   PainSc: 4                  Jemarcus Dougal DAVID

## 2017-10-09 NOTE — Interval H&P Note (Signed)
History and Physical Interval Note:  10/09/2017 9:24 AM  Felicia Moore  has presented today for surgery, with the diagnosis of prolonged vomiting, pink tinged at the end.  red blood PR.  anemia.  hx gastric ulcer.  The various methods of treatment have been discussed with the patient and family. After consideration of risks, benefits and other options for treatment, the patient has consented to  Procedure(s): ESOPHAGOGASTRODUODENOSCOPY (EGD) WITH PROPOFOL (N/A) as a surgical intervention .  The patient's history has been reviewed, patient examined, no change in status, stable for surgery.  I have reviewed the patient's chart and labs.  Questions were answered to the patient's satisfaction.     Phillip Sandler

## 2017-10-09 NOTE — Op Note (Signed)
[facility] Patient Name: Felicia Moore Procedure Date : 10/09/2017 MRN: [id number] Attending MD: Mauri Pole , MD Date of Birth: Jun 20, 1980 CSN: [id number] Age: 37 Admit Type: Inpatient Procedure:                Upper GI endoscopy Indications:              Active gastrointestinal bleeding, Suspected upper                            gastrointestinal bleeding Providers:                Mauri Pole, MD, Baird Cancer, RN, Cherylynn Ridges, Technician Referring MD:              Medicines:                Monitored Anesthesia Care Complications:            No immediate complications. Estimated Blood Loss:     Estimated blood loss was minimal. Procedure:                Pre-Anesthesia Assessment:                           - Prior to the procedure, a History and Physical                            was performed, and patient medications and                            allergies were reviewed. The patient's tolerance of                            previous anesthesia was also reviewed. The risks                            and benefits of the procedure and the sedation                            options and risks were discussed with the patient.                            All questions were answered, and informed consent                            was obtained. Prior Anticoagulants: The patient has                            taken no previous anticoagulant or antiplatelet                            agents. ASA Grade Assessment: II - A patient with  mild systemic disease. After reviewing the risks                            and benefits, the patient was deemed in                            satisfactory condition to undergo the procedure.                           After obtaining informed consent, the endoscope was                            passed under direct vision. Throughout the   procedure, the patient's blood pressure, pulse, and                            oxygen saturations were monitored continuously. The                            [id number] ([id number]) scope was introduced through the                            mouth, and advanced to the second part of duodenum.                            The upper GI endoscopy was accomplished without                            difficulty. Scope In: Scope Out: Findings:      Esophagogastric landmarks were identified: the Z-line was found at 33 cm       and the gastroesophageal junction was found at 33 cm from the incisors.      One non-obstructing non-bleeding cratered gastric ulcer with no stigmata       of bleeding was found in the prepyloric region of the stomach. The       lesion was 7 mm in largest dimension. There is no evidence of       perforation. Biopsies were taken with a cold forceps for Helicobacter       pylori testing.      Patchy mildly erythematous mucosa without active bleeding and with no       stigmata of bleeding was found in the duodenal bulb.      The second portion of the duodenum was normal. Impression:               - Esophagogastric landmarks identified.                           - Non-obstructing non-bleeding gastric ulcer with                            no stigmata of bleeding. NSAID induced etiology.                            There is no evidence of perforation. Biopsied.                           -  Erythematous duodenopathy.                           - Normal second portion of the duodenum. Recommendation:           - Patient has a contact number available for                            emergencies. The signs and symptoms of potential                            delayed complications were discussed with the                            patient. Return to normal activities tomorrow.                            Written discharge instructions were provided to the                            patient.                            - Resume previous diet.                           - Continue present medications.                           - Await pathology results.                           - Repeat upper endoscopy in 3 months to check                            healing.                           - Use Prilosec (omeprazole) 40 mg PO BID for 3                            months followed by Omeprazole 40mg  daily                           - No aspirin, ibuprofen, naproxen, or other                            non-steroidal anti-inflammatory drugs. Procedure Code(s):        --- Professional ---                           [phone], Esophagogastroduodenoscopy, flexible,                            transoral; with biopsy, single or multiple Diagnosis Code(s):        --- Professional ---  T39.395S, Adverse effect of other nonsteroidal                            anti-inflammatory drugs [NSAID], sequela                           K25.9, Gastric ulcer, unspecified as acute or                            chronic, without hemorrhage or perforation                           K31.89, Other diseases of stomach and duodenum                           K92.2, Gastrointestinal hemorrhage, unspecified CPT copyright 2017 [employer]. All rights reserved. The codes documented in this report are preliminary and upon coder review may  be revised to meet current compliance requirements. Mauri Pole, MD 10/09/2017 10:37:55 AM This report has been signed electronically. Number of Addenda: 0

## 2017-10-10 ENCOUNTER — Telehealth: Payer: Self-pay

## 2017-10-10 ENCOUNTER — Other Ambulatory Visit: Payer: Self-pay

## 2017-10-10 NOTE — Telephone Encounter (Signed)
-----   Message from Vena Rua, PA-C sent at 10/10/2017 11:19 AM EDT ----- Regarding: arrange follow up appt [facility]. Dr Delane Ginger would like to see the pt at next available slot (in 2 to 3 months).  fup of gastric ulcer.   Thanks.

## 2017-10-12 ENCOUNTER — Telehealth: Payer: Self-pay | Admitting: Gastroenterology

## 2017-10-12 ENCOUNTER — Encounter (HOSPITAL_COMMUNITY): Payer: Self-pay | Admitting: Gastroenterology

## 2017-10-12 NOTE — Telephone Encounter (Signed)
Pt is requesting a letter from Dr. Silverio Decamp stating that she was discharged from the hospital. She is requesting this letter for her job. Pls call her if this is possible. Thank you.

## 2017-10-12 NOTE — Telephone Encounter (Signed)
Left a message to return the call.  She also has a return appointment with Dr Silverio Decamp.

## 2017-10-30 ENCOUNTER — Encounter: Payer: Self-pay | Admitting: Gastroenterology

## 2017-11-27 ENCOUNTER — Ambulatory Visit (INDEPENDENT_AMBULATORY_CARE_PROVIDER_SITE_OTHER): Payer: Medicaid Other

## 2017-11-27 ENCOUNTER — Encounter (HOSPITAL_COMMUNITY): Payer: Self-pay | Admitting: Emergency Medicine

## 2017-11-27 ENCOUNTER — Ambulatory Visit (HOSPITAL_COMMUNITY)
Admission: EM | Admit: 2017-11-27 | Discharge: 2017-11-27 | Disposition: A | Discharge status: Home / Self Care | Payer: Medicaid Other | Attending: Family Medicine | Admitting: Family Medicine

## 2017-11-27 ENCOUNTER — Other Ambulatory Visit: Payer: Self-pay

## 2017-11-27 DIAGNOSIS — R059 Cough, unspecified: Secondary | ICD-10-CM

## 2017-11-27 DIAGNOSIS — J029 Acute pharyngitis, unspecified: Secondary | ICD-10-CM

## 2017-11-27 DIAGNOSIS — R05 Cough: Secondary | ICD-10-CM | POA: Diagnosis not present

## 2017-11-27 DIAGNOSIS — J189 Pneumonia, unspecified organism: Secondary | ICD-10-CM

## 2017-11-27 MED ORDER — IPRATROPIUM-ALBUTEROL 0.5-2.5 (3) MG/3ML IN SOLN
3.0000 mL | Freq: Once | RESPIRATORY_TRACT | Status: AC
Start: 2017-11-27 — End: 2017-11-27
  Administered 2017-11-27: 3 mL via RESPIRATORY_TRACT

## 2017-11-27 MED ORDER — GABAPENTIN 300 MG PO CAPS: 600.0000 mg | ORAL_CAPSULE | Freq: Three times a day (TID) | ORAL | 0 refills | Status: AC

## 2017-11-27 MED ORDER — MONTELUKAST SODIUM 10 MG PO TABS: 10.0000 mg | ORAL_TABLET | Freq: Every day | ORAL | 0 refills | Status: AC

## 2017-11-27 MED ORDER — LISINOPRIL 20 MG PO TABS: 20.0000 mg | ORAL_TABLET | Freq: Every day | ORAL | 0 refills | Status: AC

## 2017-11-27 MED ORDER — SUCRALFATE 1 G PO TABS: 1.0000 g | ORAL_TABLET | Freq: Three times a day (TID) | ORAL | 0 refills | Status: AC

## 2017-11-27 MED ORDER — IPRATROPIUM-ALBUTEROL 0.5-2.5 (3) MG/3ML IN SOLN
RESPIRATORY_TRACT | Status: AC
  Filled 2017-11-27: qty 3

## 2017-11-27 MED ORDER — AZITHROMYCIN 250 MG PO TABS: ORAL_TABLET | ORAL | 0 refills | Status: AC

## 2017-11-27 MED ORDER — OMEPRAZOLE 40 MG PO CPDR: 40.0000 mg | DELAYED_RELEASE_CAPSULE | Freq: Two times a day (BID) | ORAL | 0 refills | Status: AC

## 2017-11-27 NOTE — ED Provider Notes (Signed)
[facility]    CSN: [id number] Arrival date & time: 11/27/17  1234     History   Chief Complaint Chief Complaint  Patient presents with  . Cough    HPI Felicia Moore is a 37 y.o. female.   Felicia Moore presents with complaints of persistent cough. At times makes her ribs feel sore from coughing. Has chest tightness at times, inhaler does help. No chest pain . Sore throat . Subjective night time fevers. Tylenol helps somewhat. Has also tried alkaseltzer cold which has minimally helped. She has had ill coworkers. Cough for the past 1.5 weeks. Worse over the past 6 days. Smokes approximately 1/3ppd. No history of asthma. Was in hospital 5/5-5/7. Had aspiration PNA and completed course of augmentin. States felt like cough never completely resolved. Also states she is out of her reflux meds, bp medications as well as gabapentin. Still in between finding PCP due to insurance change. Has been out for the past few days. Hx oc breast cancer, diverticulitis, htn, migraines, sciatica, stroke, gi bleeding/ ulcer.     ROS per HPI.      Past Medical History:  Diagnosis Date  . Breast CA ([place])   . Breast cancer ([place])    left lumpectomy 2006  . Diverticulitis   . Hypertension   . Migraines   . [facility] spotted fever   . Sciatica   . Stroke [facility]) 2013  . Uterine cancer [facility])     Patient Active Problem List   Diagnosis Date Noted  . Vomiting 10/07/2017  . GI bleeding 10/07/2017  . Low back pain 09/28/2014  . Essential hypertension 09/28/2014  . Allergic rhinitis 09/28/2014    Past Surgical History:  Procedure Laterality Date  . ABDOMINAL HYSTERECTOMY    . BREAST LUMPECTOMY Left   . CARPAL TUNNEL RELEASE Right   . CHOLECYSTECTOMY    . ESOPHAGOGASTRODUODENOSCOPY (EGD) WITH PROPOFOL N/A 10/09/2017   Procedure: ESOPHAGOGASTRODUODENOSCOPY (EGD) WITH PROPOFOL;  Surgeon: Mauri Pole, MD;  Location: [facility] ENDOSCOPY;  Service: Endoscopy;  Laterality: N/A;    OB  History   None      Home Medications    Prior to Admission medications   Medication Sig Start Date End Date Taking? Authorizing Provider  azithromycin (ZITHROMAX) 250 MG tablet Take 2 tablets (500 mg total) by mouth daily for 1 day, THEN 1 tablet (250 mg total) daily for 4 days. 11/27/17 12/02/17  Zigmund Gottron, NP  fluticasone (FLONASE) 50 MCG/ACT nasal spray Place 1 spray into both nostrils 2 (two) times daily. 04/10/16   [provider]  gabapentin (NEURONTIN) 300 MG capsule Take 2 capsules (600 mg total) by mouth 3 (three) times daily. 11/27/17 12/27/17  Augusto Gamble B, NP  ipratropium (ATROVENT) 0.03 % nasal spray Place 1 spray into both nostrils daily. 06/11/17   [provider]  lisinopril (PRINIVIL,ZESTRIL) 20 MG tablet Take 1 tablet (20 mg total) by mouth daily. 11/27/17   Zigmund Gottron, NP  montelukast (SINGULAIR) 10 MG tablet Take 1 tablet (10 mg total) by mouth daily. 11/27/17   Zigmund Gottron, NP  nicotine (NICODERM CQ - DOSED IN MG/24 HR) 7 mg/24hr patch Place 1 patch (7 mg total) onto the skin daily. 10/10/17   Geradine Girt, DO  omeprazole (PRILOSEC) 40 MG capsule Take 1 capsule (40 mg total) by mouth 2 (two) times daily. 11/27/17 12/27/17  Zigmund Gottron, NP  PROAIR HFA 108 (90 Base) MCG/ACT inhaler Inhale 2 puffs into the lungs  every 4 (four) hours as needed for wheezing or shortness of breath.  04/26/16   [provider]  promethazine (PHENERGAN) 25 MG tablet Take 1 tablet (25 mg total) by mouth every 6 (six) hours as needed for nausea. 10/04/17   Wieters, Hallie C, PA-C  sucralfate (CARAFATE) 1 g tablet Take 1 tablet (1 g total) by mouth 4 (four) times daily -  with meals and at bedtime. 11/27/17 12/27/17  Zigmund Gottron, NP  traZODone (DESYREL) 50 MG tablet Take 50 mg by mouth at bedtime as needed for sleep.  03/14/17   [provider]    Family History Family History  Problem Relation Age of Onset  . Diabetes Mother   . Heart  disease Father   . Diabetes Father   . Breast cancer Maternal Grandmother   . Stomach cancer Maternal Grandfather   . Colon cancer Paternal Grandfather     Social History Social History   Tobacco Use  . Smoking status: Current Every Day Smoker    Packs/day: 1.00    Years: 20.00    Pack years: 20.00    Types: Cigarettes  . Smokeless tobacco: Never Used  Substance Use Topics  . Alcohol use: Yes    Comment: rare  . Drug use: No     Allergies   Reglan [metoclopramide]; Toradol [ketorolac tromethamine]; and Zofran [ondansetron hcl]   Review of Systems Review of Systems   Physical Exam Triage Vital Signs ED Triage Vitals  Enc Vitals Group     BP 11/27/17 1305 (!) 157/98     Pulse Rate 11/27/17 1305 76     Resp 11/27/17 1305 18     Temp 11/27/17 1305 98.7 F (37.1 C)     Temp Source 11/27/17 1305 Oral     SpO2 11/27/17 1305 97 %     Weight --      Height --      Head Circumference --      Peak Flow --      Pain Score 11/27/17 1303 4     Pain Loc --      Pain Edu? --      Excl. in [place]? --    No data found.  Updated Vital Signs BP (!) 157/98 (BP Location: Right Arm)   Pulse 76   Temp 98.7 F (37.1 C) (Oral)   Resp 18   SpO2 97%    Physical Exam  Constitutional: She is oriented to person, place, and time. She appears well-developed and well-nourished. No distress.  HENT:  Head: Normocephalic and atraumatic.  Right Ear: Tympanic membrane, external ear and ear canal normal.  Left Ear: Tympanic membrane, external ear and ear canal normal.  Nose: Nose normal.  Mouth/Throat: Uvula is midline, oropharynx is clear and moist and mucous membranes are normal. No tonsillar exudate.  Eyes: Pupils are equal, round, and reactive to light. Conjunctivae and EOM are normal.  Cardiovascular: Normal rate, regular rhythm and normal heart sounds.  Pulmonary/Chest: Effort normal and breath sounds normal.  Strong dry cough noted; without increased work of breathing or  tachypnea noted   Neurological: She is alert and oriented to person, place, and time.  Skin: Skin is warm and dry.     UC Treatments / Results  Labs (all labs ordered are listed, but only abnormal results are displayed) Labs Reviewed - No data to display  EKG None  Radiology Dg Chest 2 View  Result Date: 11/27/2017 CLINICAL DATA:  Recent pneumonia.  Persistent coughing and fever. EXAM: CHEST - 2 VIEW COMPARISON:  CT 10/07/2017.  Chest x-ray 03/18/2017. FINDINGS: Mediastinum and hilar structures normal. Mild residual interstitial prominence suggesting pneumonitis. No focal alveolar infiltrate noted on today's exam. No pleural effusion or pneumothorax. Mild cardiomegaly. No pulmonary venous congestion. No acute bony abnormality. IMPRESSION: Mild residual interstitial prominence suggesting pneumonitis. No focal alveolar infiltrate noted on today's exam. Electronically Signed   By: Marcello Moores  Register   On: 11/27/2017 13:26    Procedures Procedures (including critical care time)  Medications Ordered in UC Medications  ipratropium-albuterol (DUONEB) 0.5-2.5 (3) MG/3ML nebulizer solution 3 mL (3 mLs Nebulization Given 11/27/17 1324)    Initial Impression / Assessment and Plan / UC Course  I have reviewed the triage vital signs and the nursing notes.  Pertinent labs & imaging results that were available during my care of the patient were reviewed by me and considered in my medical decision making (see chart for details).     Chest xray obtained, duoneb provided. Chest xray still with pneumonitis present. Course of azithromycin initiated. Patient has inhaler.  Encouraged decrease to quit smoking.  Medications refilled today. Reiterated to establish with primary care for recheck and management of medications. Return precautions provided. Patient verbalized understanding and agreeable to plan.   Final Clinical Impressions(s) / UC Diagnoses   Final diagnoses:  Pneumonitis  Cough    Discharge Instructions   None    ED Prescriptions    Medication Sig Dispense Auth. Provider   gabapentin (NEURONTIN) 300 MG capsule Take 2 capsules (600 mg total) by mouth 3 (three) times daily. 180 capsule Burky, Natalie B, NP   montelukast (SINGULAIR) 10 MG tablet Take 1 tablet (10 mg total) by mouth daily. 30 tablet Augusto Gamble B, NP   omeprazole (PRILOSEC) 40 MG capsule Take 1 capsule (40 mg total) by mouth 2 (two) times daily. 60 capsule Augusto Gamble B, NP   sucralfate (CARAFATE) 1 g tablet Take 1 tablet (1 g total) by mouth 4 (four) times daily -  with meals and at bedtime. 120 tablet Augusto Gamble B, NP   lisinopril (PRINIVIL,ZESTRIL) 20 MG tablet Take 1 tablet (20 mg total) by mouth daily. 30 tablet Augusto Gamble B, NP   azithromycin (ZITHROMAX) 250 MG tablet Take 2 tablets (500 mg total) by mouth daily for 1 day, THEN 1 tablet (250 mg total) daily for 4 days. 6 tablet Zigmund Gottron, NP     Controlled Substance Prescriptions [place] Controlled Substance Registry consulted? Not Applicable   Zigmund Gottron, NP 11/27/17 1340

## 2017-11-27 NOTE — Discharge Instructions (Signed)
Complete course of antibiotics.  Push fluids to ensure adequate hydration and keep secretions thin.  Use of your inhaler as needed for tightness. Please establish with a primary care provider for recheck and for medication management.  If develop worsening of cough, shortness of breath, fever, or otherwise worsening please return or go to Er.

## 2017-11-27 NOTE — ED Triage Notes (Signed)
The patient presented to the [facility] with a complaint of a cough and rib pain x 2 weeks.

## 2017-12-18 ENCOUNTER — Ambulatory Visit: Payer: Medicaid Other | Admitting: Gastroenterology

## 2018-01-24 ENCOUNTER — Emergency Department (HOSPITAL_COMMUNITY)
Admission: EM | Admit: 2018-01-24 | Discharge: 2018-01-24 | Disposition: A | Discharge status: Home / Self Care | Payer: Medicaid Other | Attending: Emergency Medicine | Admitting: Emergency Medicine

## 2018-01-24 ENCOUNTER — Other Ambulatory Visit: Payer: Self-pay

## 2018-01-24 ENCOUNTER — Encounter (HOSPITAL_COMMUNITY): Payer: Self-pay

## 2018-01-24 DIAGNOSIS — R1110 Vomiting, unspecified: Secondary | ICD-10-CM | POA: Diagnosis not present

## 2018-01-24 DIAGNOSIS — Z5321 Procedure and treatment not carried out due to patient leaving prior to being seen by health care provider: Secondary | ICD-10-CM | POA: Diagnosis not present

## 2018-01-24 LAB — COMPREHENSIVE METABOLIC PANEL
ALBUMIN: 3.3 g/dL — AB (ref 3.5–5.0)
ALK PHOS: 55 U/L (ref 38–126)
ALT: 11 U/L (ref 0–44)
ANION GAP: 6 (ref 5–15)
AST: 16 U/L (ref 15–41)
BUN: 14 mg/dL (ref 6–20)
CALCIUM: 8.6 mg/dL — AB (ref 8.9–10.3)
CO2: 22 mmol/L (ref 22–32)
Chloride: 114 mmol/L — ABNORMAL HIGH (ref 98–111)
Creatinine, Ser: 0.89 mg/dL (ref 0.44–1.00)
GFR calc non Af Amer: >60 mL/min (ref >60)
GLUCOSE: 134 mg/dL — AB (ref 70–99)
POTASSIUM: 3.4 mmol/L — AB (ref 3.5–5.1)
SODIUM: 142 mmol/L (ref 135–145)
TOTAL PROTEIN: 5.5 g/dL — AB (ref 6.5–8.1)
Total Bilirubin: 0.6 mg/dL (ref 0.3–1.2)

## 2018-01-24 LAB — CBC
HEMATOCRIT: 47.4 % — AB (ref 36.0–46.0)
HEMOGLOBIN: 14.7 g/dL (ref 12.0–15.0)
MCH: 31.5 pg (ref 26.0–34.0)
MCHC: 31 g/dL (ref 30.0–36.0)
MCV: 101.5 fL — ABNORMAL HIGH (ref 78.0–100.0)
Platelets: 332 10*3/uL (ref 150–400)
RBC: 4.67 MIL/uL (ref 3.87–5.11)
RDW: 15.1 % (ref 11.5–15.5)
WBC: 11.6 10*3/uL — ABNORMAL HIGH (ref 4.0–10.5)

## 2018-01-24 LAB — LIPASE, BLOOD: LIPASE: 29 U/L (ref 11–51)

## 2018-01-24 NOTE — ED Notes (Signed)
States she feels better and is leaving didn't want to wait to see charge nurse.

## 2018-01-24 NOTE — ED Triage Notes (Signed)
Pt states she has been throwing up since last night. Reports hx of stomach ulcers with similar symptoms. Pt is alert and oriented, skin warm and dry.

## 2018-11-03 IMAGING — CR DG CHEST 2V
2 series · 2 of 2 positions shown · non-contrast
Comparison: 09/19/2014

CLINICAL DATA: N/v, hypotensive, s/p syncopal episode s/p donation
of plasma today.

EXAM:
CHEST  2 VIEW

[w chest lat]
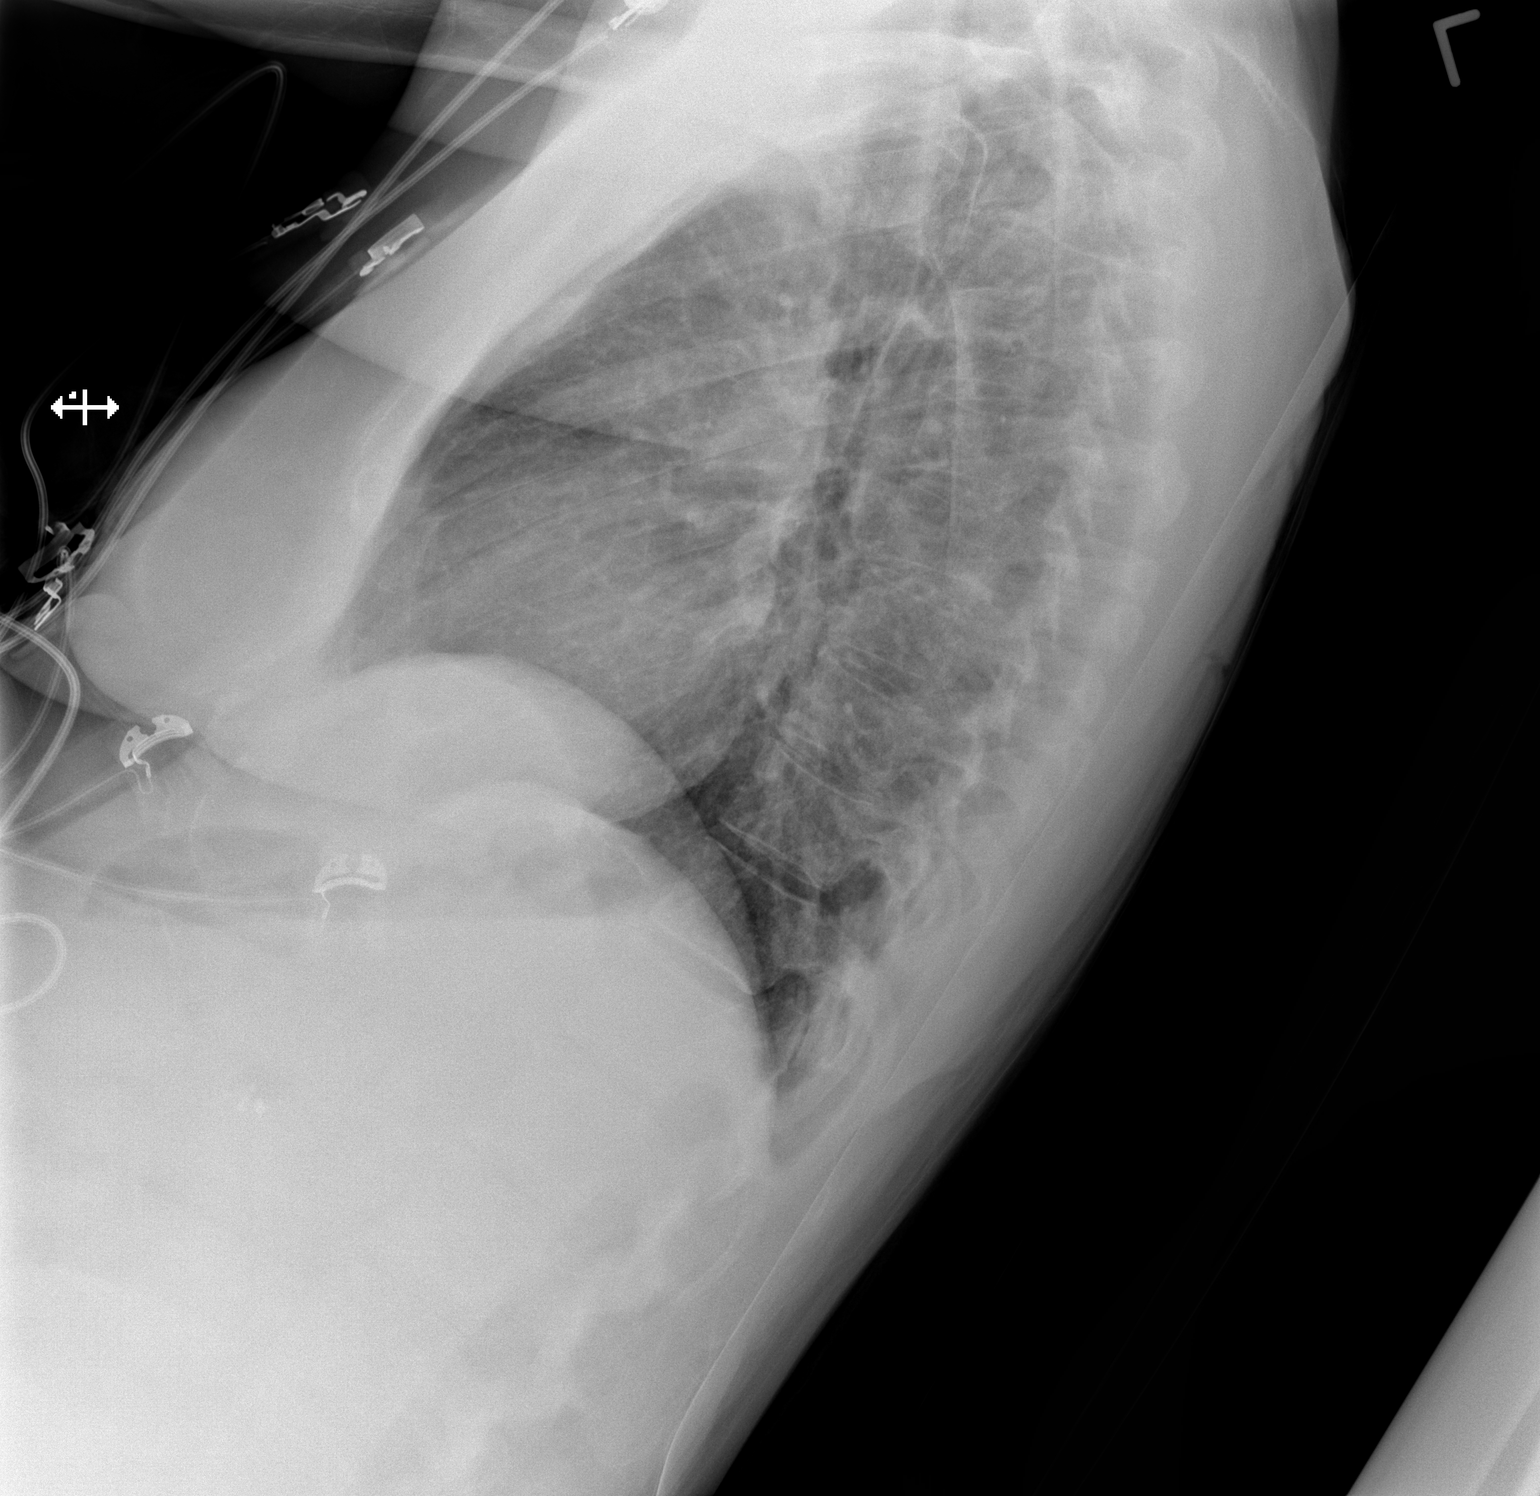

[x chest ap]
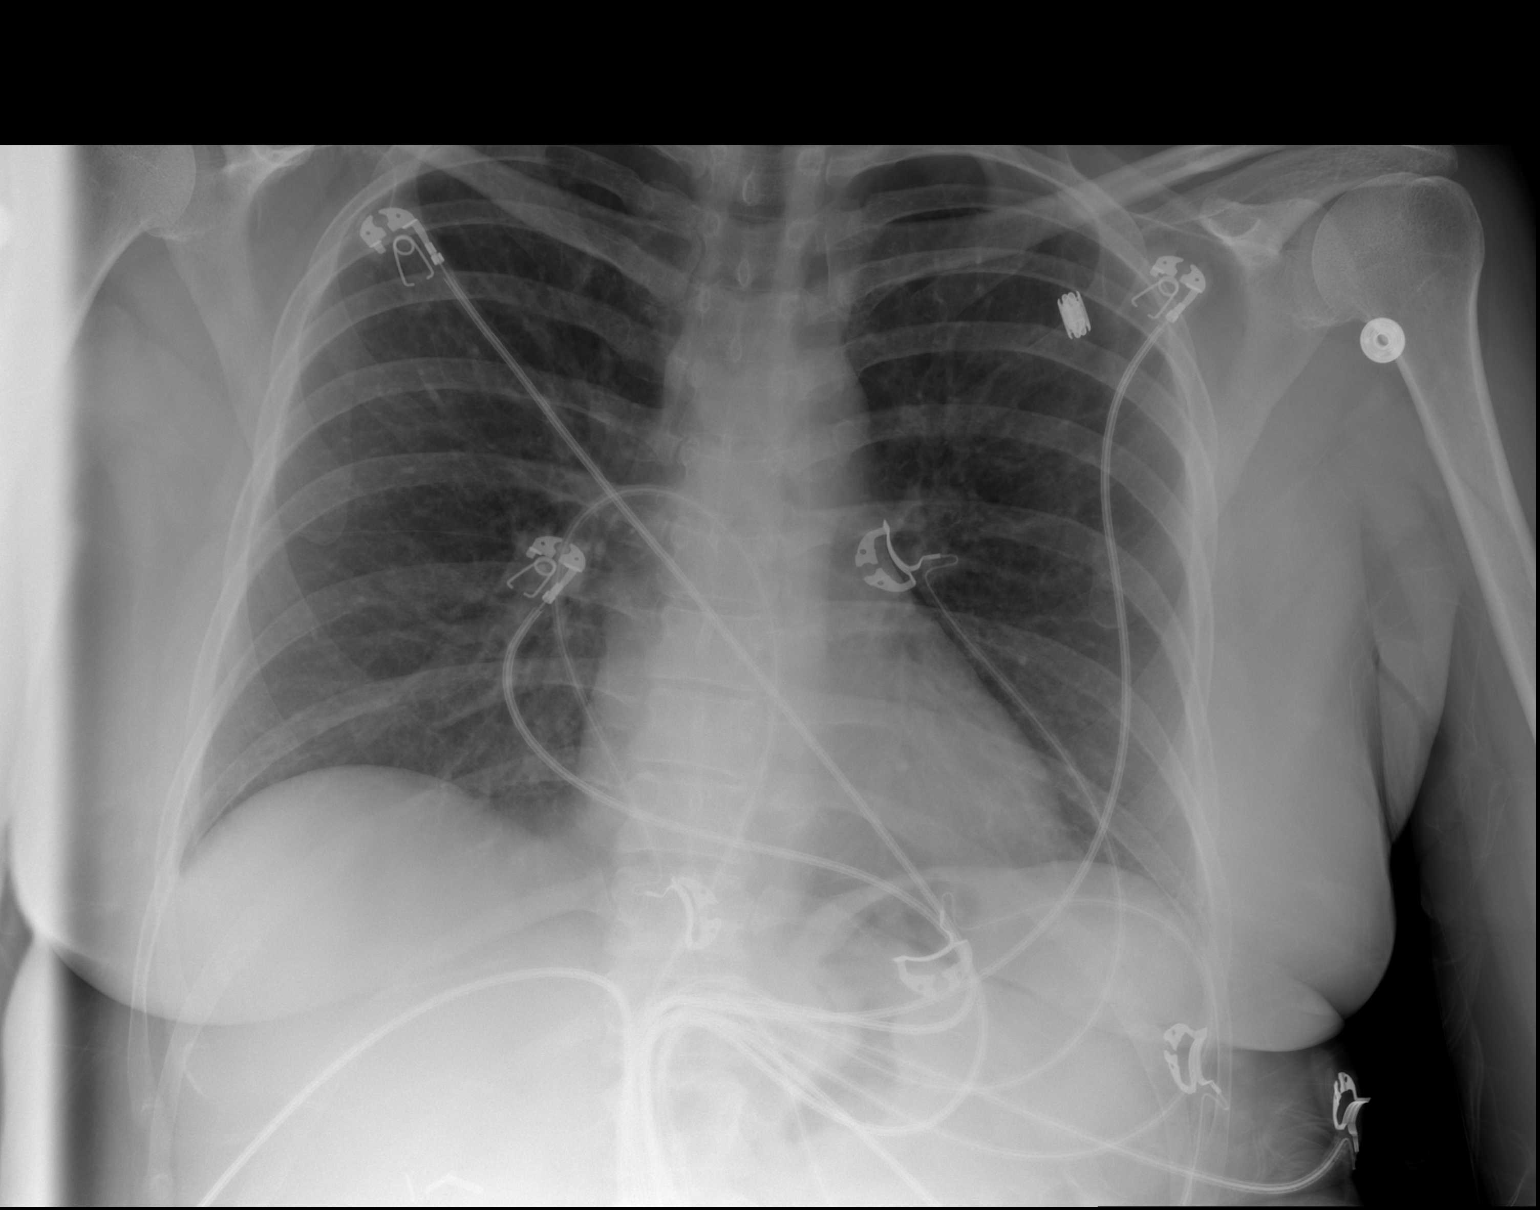

[2 of 2 positions shown; findings below may reference images not displayed]

FINDINGS: Normal mediastinum and cardiac silhouette. Normal pulmonary
vasculature. No evidence of effusion, infiltrate, or pneumothorax.
No acute bony abnormality.
IMPRESSION: Normal chest radiograph
